# Patient Record
Sex: Male | Born: 1935 | Race: White | Hispanic: No | Marital: Married | Smoking: Former smoker
Health system: Southern US, Community
[De-identification: ages and names within clinical notes are randomized; demographics above are authoritative.]

## PROBLEM LIST (undated history)

## (undated) DIAGNOSIS — I2510 Atherosclerotic heart disease of native coronary artery without angina pectoris: Secondary | ICD-10-CM

## (undated) DIAGNOSIS — J449 Chronic obstructive pulmonary disease, unspecified: Secondary | ICD-10-CM

## (undated) DIAGNOSIS — I519 Heart disease, unspecified: Secondary | ICD-10-CM

## (undated) DIAGNOSIS — E119 Type 2 diabetes mellitus without complications: Secondary | ICD-10-CM

## (undated) DIAGNOSIS — C3490 Malignant neoplasm of unspecified part of unspecified bronchus or lung: Secondary | ICD-10-CM

## (undated) DIAGNOSIS — I10 Essential (primary) hypertension: Secondary | ICD-10-CM

## (undated) HISTORY — PX: EYE SURGERY: SHX253

## (undated) HISTORY — PX: CARDIAC SURGERY: SHX584

## (undated) HISTORY — PX: CORONARY ANGIOPLASTY WITH STENT PLACEMENT: SHX49

## (undated) HISTORY — PX: CHOLECYSTECTOMY: SHX55

---

## 1999-06-02 ENCOUNTER — Encounter: Payer: Self-pay | Admitting: *Deleted

## 1999-06-02 ENCOUNTER — Inpatient Hospital Stay (HOSPITAL_COMMUNITY): Admission: EM | Admit: 1999-06-02 | Discharge: 1999-06-03 | Payer: Self-pay | Admitting: *Deleted

## 1999-06-24 ENCOUNTER — Encounter: Payer: Self-pay | Admitting: Geriatric Medicine

## 1999-06-24 ENCOUNTER — Encounter: Admission: RE | Admit: 1999-06-24 | Discharge: 1999-06-24 | Payer: Self-pay | Admitting: Geriatric Medicine

## 1999-06-28 ENCOUNTER — Inpatient Hospital Stay (HOSPITAL_COMMUNITY): Admission: RE | Admit: 1999-06-28 | Discharge: 1999-07-01 | Payer: Self-pay | Admitting: *Deleted

## 1999-06-28 ENCOUNTER — Encounter (INDEPENDENT_AMBULATORY_CARE_PROVIDER_SITE_OTHER): Payer: Self-pay | Admitting: *Deleted

## 2003-02-09 ENCOUNTER — Encounter: Payer: Self-pay | Admitting: Emergency Medicine

## 2003-02-09 ENCOUNTER — Emergency Department (HOSPITAL_COMMUNITY): Admission: EM | Admit: 2003-02-09 | Discharge: 2003-02-09 | Payer: Self-pay | Admitting: Emergency Medicine

## 2003-02-13 ENCOUNTER — Ambulatory Visit (HOSPITAL_COMMUNITY): Admission: RE | Admit: 2003-02-13 | Discharge: 2003-02-13 | Payer: Self-pay | Admitting: Geriatric Medicine

## 2003-04-15 ENCOUNTER — Inpatient Hospital Stay (HOSPITAL_COMMUNITY): Admission: EM | Admit: 2003-04-15 | Discharge: 2003-04-27 | Payer: Self-pay | Admitting: Emergency Medicine

## 2003-04-15 ENCOUNTER — Encounter: Payer: Self-pay | Admitting: Interventional Cardiology

## 2003-04-16 ENCOUNTER — Encounter: Payer: Self-pay | Admitting: Interventional Cardiology

## 2003-04-17 ENCOUNTER — Encounter: Payer: Self-pay | Admitting: Thoracic Surgery (Cardiothoracic Vascular Surgery)

## 2003-04-21 ENCOUNTER — Encounter: Payer: Self-pay | Admitting: Thoracic Surgery (Cardiothoracic Vascular Surgery)

## 2003-04-22 ENCOUNTER — Encounter: Payer: Self-pay | Admitting: Thoracic Surgery (Cardiothoracic Vascular Surgery)

## 2003-04-23 ENCOUNTER — Encounter: Payer: Self-pay | Admitting: Thoracic Surgery (Cardiothoracic Vascular Surgery)

## 2003-06-01 ENCOUNTER — Encounter (HOSPITAL_COMMUNITY): Admission: RE | Admit: 2003-06-01 | Discharge: 2003-08-30 | Payer: Self-pay | Admitting: Interventional Cardiology

## 2003-06-27 ENCOUNTER — Inpatient Hospital Stay (HOSPITAL_COMMUNITY): Admission: EM | Admit: 2003-06-27 | Discharge: 2003-06-29 | Payer: Self-pay | Admitting: Emergency Medicine

## 2003-09-04 ENCOUNTER — Encounter: Admission: RE | Admit: 2003-09-04 | Discharge: 2003-09-04 | Payer: Self-pay | Admitting: Orthopedic Surgery

## 2009-12-02 ENCOUNTER — Encounter (HOSPITAL_COMMUNITY): Admission: RE | Admit: 2009-12-02 | Discharge: 2010-03-02 | Payer: Self-pay | Admitting: Internal Medicine

## 2010-03-08 ENCOUNTER — Encounter (HOSPITAL_COMMUNITY): Admission: RE | Admit: 2010-03-08 | Discharge: 2010-06-06 | Payer: Self-pay | Admitting: Internal Medicine

## 2010-06-14 ENCOUNTER — Encounter (HOSPITAL_COMMUNITY)
Admission: RE | Admit: 2010-06-14 | Discharge: 2010-09-12 | Payer: Self-pay | Source: Home / Self Care | Attending: Internal Medicine | Admitting: Internal Medicine

## 2010-09-15 ENCOUNTER — Encounter (HOSPITAL_COMMUNITY): Payer: Self-pay

## 2010-09-19 ENCOUNTER — Ambulatory Visit (HOSPITAL_COMMUNITY): Payer: Self-pay

## 2010-09-20 ENCOUNTER — Encounter (HOSPITAL_COMMUNITY): Payer: Self-pay

## 2010-09-21 ENCOUNTER — Ambulatory Visit (HOSPITAL_COMMUNITY): Payer: Self-pay

## 2010-09-22 ENCOUNTER — Encounter (HOSPITAL_COMMUNITY): Payer: Self-pay

## 2010-09-23 ENCOUNTER — Ambulatory Visit (HOSPITAL_COMMUNITY): Payer: Self-pay

## 2010-09-26 ENCOUNTER — Ambulatory Visit (HOSPITAL_COMMUNITY): Payer: Self-pay

## 2010-09-27 ENCOUNTER — Encounter (HOSPITAL_COMMUNITY): Payer: Self-pay

## 2010-09-28 ENCOUNTER — Ambulatory Visit (HOSPITAL_COMMUNITY): Payer: Self-pay

## 2010-09-29 ENCOUNTER — Encounter (HOSPITAL_COMMUNITY): Payer: Self-pay

## 2010-09-30 ENCOUNTER — Ambulatory Visit (HOSPITAL_COMMUNITY): Payer: Self-pay

## 2010-10-03 ENCOUNTER — Ambulatory Visit (HOSPITAL_COMMUNITY): Payer: Self-pay

## 2010-10-04 ENCOUNTER — Encounter (HOSPITAL_COMMUNITY): Payer: Self-pay

## 2010-10-05 ENCOUNTER — Ambulatory Visit (HOSPITAL_COMMUNITY): Payer: Self-pay

## 2010-10-06 ENCOUNTER — Encounter (HOSPITAL_COMMUNITY): Payer: Self-pay

## 2010-10-07 ENCOUNTER — Ambulatory Visit (HOSPITAL_COMMUNITY): Payer: Self-pay

## 2010-10-10 ENCOUNTER — Ambulatory Visit (HOSPITAL_COMMUNITY): Payer: Self-pay

## 2010-10-11 ENCOUNTER — Encounter (HOSPITAL_COMMUNITY): Payer: Self-pay

## 2010-10-12 ENCOUNTER — Ambulatory Visit (HOSPITAL_COMMUNITY): Payer: Self-pay

## 2010-10-13 ENCOUNTER — Encounter (HOSPITAL_COMMUNITY): Payer: Self-pay

## 2010-10-14 ENCOUNTER — Ambulatory Visit (HOSPITAL_COMMUNITY): Payer: Self-pay

## 2010-10-17 ENCOUNTER — Ambulatory Visit (HOSPITAL_COMMUNITY): Payer: Self-pay

## 2010-10-18 ENCOUNTER — Encounter (HOSPITAL_COMMUNITY): Payer: Self-pay | Attending: Internal Medicine

## 2010-10-18 DIAGNOSIS — J4489 Other specified chronic obstructive pulmonary disease: Secondary | ICD-10-CM | POA: Insufficient documentation

## 2010-10-18 DIAGNOSIS — J449 Chronic obstructive pulmonary disease, unspecified: Secondary | ICD-10-CM | POA: Insufficient documentation

## 2010-10-18 DIAGNOSIS — Z85118 Personal history of other malignant neoplasm of bronchus and lung: Secondary | ICD-10-CM | POA: Insufficient documentation

## 2010-10-18 DIAGNOSIS — Z5189 Encounter for other specified aftercare: Secondary | ICD-10-CM | POA: Insufficient documentation

## 2010-10-18 DIAGNOSIS — E119 Type 2 diabetes mellitus without complications: Secondary | ICD-10-CM | POA: Insufficient documentation

## 2010-10-18 DIAGNOSIS — I10 Essential (primary) hypertension: Secondary | ICD-10-CM | POA: Insufficient documentation

## 2010-10-18 DIAGNOSIS — Z951 Presence of aortocoronary bypass graft: Secondary | ICD-10-CM | POA: Insufficient documentation

## 2010-10-19 ENCOUNTER — Ambulatory Visit (HOSPITAL_COMMUNITY): Payer: Self-pay

## 2010-10-20 ENCOUNTER — Encounter (HOSPITAL_COMMUNITY): Payer: Self-pay

## 2010-10-21 ENCOUNTER — Ambulatory Visit (HOSPITAL_COMMUNITY): Payer: Self-pay

## 2010-10-24 ENCOUNTER — Ambulatory Visit (HOSPITAL_COMMUNITY): Payer: Self-pay

## 2010-10-25 ENCOUNTER — Encounter (HOSPITAL_COMMUNITY): Payer: Self-pay

## 2010-10-26 ENCOUNTER — Ambulatory Visit (HOSPITAL_COMMUNITY): Payer: Self-pay

## 2010-10-27 ENCOUNTER — Encounter (HOSPITAL_COMMUNITY): Payer: Self-pay

## 2010-10-28 ENCOUNTER — Ambulatory Visit (HOSPITAL_COMMUNITY): Payer: Self-pay

## 2010-10-30 LAB — GLUCOSE, CAPILLARY: Glucose-Capillary: 131 mg/dL — ABNORMAL HIGH (ref 70–99)

## 2010-10-31 ENCOUNTER — Ambulatory Visit (HOSPITAL_COMMUNITY): Payer: Self-pay

## 2010-10-31 LAB — GLUCOSE, CAPILLARY
Glucose-Capillary: 122 mg/dL — ABNORMAL HIGH (ref 70–99)
Glucose-Capillary: 134 mg/dL — ABNORMAL HIGH (ref 70–99)
Glucose-Capillary: 161 mg/dL — ABNORMAL HIGH (ref 70–99)

## 2010-11-01 ENCOUNTER — Encounter (HOSPITAL_COMMUNITY): Payer: Self-pay

## 2010-11-01 LAB — GLUCOSE, CAPILLARY
Glucose-Capillary: 122 mg/dL — ABNORMAL HIGH (ref 70–99)
Glucose-Capillary: 92 mg/dL (ref 70–99)
Glucose-Capillary: 97 mg/dL (ref 70–99)

## 2010-11-02 ENCOUNTER — Ambulatory Visit (HOSPITAL_COMMUNITY): Payer: Self-pay

## 2010-11-03 ENCOUNTER — Encounter (HOSPITAL_COMMUNITY): Payer: Self-pay

## 2010-11-04 ENCOUNTER — Ambulatory Visit (HOSPITAL_COMMUNITY): Payer: Self-pay

## 2010-11-07 ENCOUNTER — Ambulatory Visit (HOSPITAL_COMMUNITY): Payer: Self-pay

## 2010-11-08 ENCOUNTER — Encounter (HOSPITAL_COMMUNITY): Payer: Self-pay

## 2010-11-09 ENCOUNTER — Ambulatory Visit (HOSPITAL_COMMUNITY): Payer: Self-pay

## 2010-11-10 ENCOUNTER — Encounter (HOSPITAL_COMMUNITY): Payer: Self-pay

## 2010-11-11 ENCOUNTER — Ambulatory Visit (HOSPITAL_COMMUNITY): Payer: Self-pay

## 2010-11-14 ENCOUNTER — Ambulatory Visit (HOSPITAL_COMMUNITY): Payer: Self-pay

## 2010-11-15 ENCOUNTER — Encounter (HOSPITAL_COMMUNITY): Payer: Self-pay | Attending: Internal Medicine

## 2010-11-15 DIAGNOSIS — J449 Chronic obstructive pulmonary disease, unspecified: Secondary | ICD-10-CM | POA: Insufficient documentation

## 2010-11-15 DIAGNOSIS — I10 Essential (primary) hypertension: Secondary | ICD-10-CM | POA: Insufficient documentation

## 2010-11-15 DIAGNOSIS — Z951 Presence of aortocoronary bypass graft: Secondary | ICD-10-CM | POA: Insufficient documentation

## 2010-11-15 DIAGNOSIS — Z5189 Encounter for other specified aftercare: Secondary | ICD-10-CM | POA: Insufficient documentation

## 2010-11-15 DIAGNOSIS — J4489 Other specified chronic obstructive pulmonary disease: Secondary | ICD-10-CM | POA: Insufficient documentation

## 2010-11-15 DIAGNOSIS — E119 Type 2 diabetes mellitus without complications: Secondary | ICD-10-CM | POA: Insufficient documentation

## 2010-11-15 DIAGNOSIS — Z85118 Personal history of other malignant neoplasm of bronchus and lung: Secondary | ICD-10-CM | POA: Insufficient documentation

## 2010-11-16 ENCOUNTER — Ambulatory Visit (HOSPITAL_COMMUNITY): Payer: Self-pay

## 2010-11-17 ENCOUNTER — Encounter (HOSPITAL_COMMUNITY): Payer: Self-pay

## 2010-11-18 ENCOUNTER — Ambulatory Visit (HOSPITAL_COMMUNITY): Payer: Self-pay

## 2010-11-21 ENCOUNTER — Ambulatory Visit (HOSPITAL_COMMUNITY): Payer: Self-pay

## 2010-11-22 ENCOUNTER — Encounter (HOSPITAL_COMMUNITY): Payer: Self-pay

## 2010-11-23 ENCOUNTER — Ambulatory Visit (HOSPITAL_COMMUNITY): Payer: Self-pay

## 2010-11-24 ENCOUNTER — Encounter (HOSPITAL_COMMUNITY): Payer: Self-pay

## 2010-11-25 ENCOUNTER — Ambulatory Visit (HOSPITAL_COMMUNITY): Payer: Self-pay

## 2010-11-28 ENCOUNTER — Ambulatory Visit (HOSPITAL_COMMUNITY): Payer: Self-pay

## 2010-11-29 ENCOUNTER — Encounter (HOSPITAL_COMMUNITY): Payer: Self-pay

## 2010-11-30 ENCOUNTER — Ambulatory Visit (HOSPITAL_COMMUNITY): Payer: Self-pay

## 2010-12-01 ENCOUNTER — Encounter (HOSPITAL_COMMUNITY): Payer: Self-pay

## 2010-12-02 ENCOUNTER — Ambulatory Visit (HOSPITAL_COMMUNITY): Payer: Self-pay

## 2010-12-05 ENCOUNTER — Ambulatory Visit (HOSPITAL_COMMUNITY): Payer: Self-pay

## 2010-12-06 ENCOUNTER — Encounter (HOSPITAL_COMMUNITY): Payer: Self-pay

## 2010-12-07 ENCOUNTER — Ambulatory Visit (HOSPITAL_COMMUNITY): Payer: Self-pay

## 2010-12-08 ENCOUNTER — Encounter (HOSPITAL_COMMUNITY): Payer: Self-pay

## 2010-12-09 ENCOUNTER — Ambulatory Visit (HOSPITAL_COMMUNITY): Payer: Self-pay

## 2010-12-12 ENCOUNTER — Ambulatory Visit (HOSPITAL_COMMUNITY): Payer: Self-pay

## 2010-12-13 ENCOUNTER — Encounter (HOSPITAL_COMMUNITY): Payer: Self-pay | Attending: Internal Medicine

## 2010-12-13 DIAGNOSIS — I10 Essential (primary) hypertension: Secondary | ICD-10-CM | POA: Insufficient documentation

## 2010-12-13 DIAGNOSIS — E119 Type 2 diabetes mellitus without complications: Secondary | ICD-10-CM | POA: Insufficient documentation

## 2010-12-13 DIAGNOSIS — J449 Chronic obstructive pulmonary disease, unspecified: Secondary | ICD-10-CM | POA: Insufficient documentation

## 2010-12-13 DIAGNOSIS — Z85118 Personal history of other malignant neoplasm of bronchus and lung: Secondary | ICD-10-CM | POA: Insufficient documentation

## 2010-12-13 DIAGNOSIS — Z951 Presence of aortocoronary bypass graft: Secondary | ICD-10-CM | POA: Insufficient documentation

## 2010-12-13 DIAGNOSIS — J4489 Other specified chronic obstructive pulmonary disease: Secondary | ICD-10-CM | POA: Insufficient documentation

## 2010-12-13 DIAGNOSIS — Z5189 Encounter for other specified aftercare: Secondary | ICD-10-CM | POA: Insufficient documentation

## 2010-12-14 ENCOUNTER — Ambulatory Visit (HOSPITAL_COMMUNITY): Payer: Self-pay

## 2010-12-15 ENCOUNTER — Encounter (HOSPITAL_COMMUNITY): Payer: Self-pay

## 2010-12-16 ENCOUNTER — Ambulatory Visit (HOSPITAL_COMMUNITY): Payer: Self-pay

## 2010-12-19 ENCOUNTER — Ambulatory Visit (HOSPITAL_COMMUNITY): Payer: Self-pay

## 2010-12-20 ENCOUNTER — Encounter (HOSPITAL_COMMUNITY): Payer: Self-pay

## 2010-12-21 ENCOUNTER — Ambulatory Visit (HOSPITAL_COMMUNITY): Payer: Self-pay

## 2010-12-22 ENCOUNTER — Encounter (HOSPITAL_COMMUNITY): Payer: Self-pay

## 2010-12-23 ENCOUNTER — Ambulatory Visit (HOSPITAL_COMMUNITY): Payer: Self-pay

## 2010-12-26 ENCOUNTER — Ambulatory Visit (HOSPITAL_COMMUNITY): Payer: Self-pay

## 2010-12-27 ENCOUNTER — Encounter (HOSPITAL_COMMUNITY): Payer: Self-pay

## 2010-12-28 ENCOUNTER — Ambulatory Visit (HOSPITAL_COMMUNITY): Payer: Self-pay

## 2010-12-29 ENCOUNTER — Encounter (HOSPITAL_COMMUNITY): Payer: Self-pay

## 2010-12-30 ENCOUNTER — Ambulatory Visit (HOSPITAL_COMMUNITY): Payer: Self-pay

## 2010-12-30 NOTE — H&P (Signed)
NAME:  Darrell Livingston, Darrell Livingston                      ACCOUNT NO.:  [id number]   MEDICAL RECORD NO.:  [id number]                   PATIENT TYPE:  INP   LOCATION:  2008                                 FACILITY:  [facility]   PHYSICIAN:  Cassell Clement, M.D.              DATE OF BIRTH:  [birth year]   DATE OF ADMISSION:  06/26/2003  DATE OF DISCHARGE:                                HISTORY & PHYSICAL   HISTORY:  This is an 77 year old gentleman admitted with increasing dyspnea  and probable early congestive heart failure.  The patient has known coronary  artery disease and 8 weeks ago underwent coronary artery bypass graft  surgery by Dr Cornelius Moras.  He has been doing well postoperatively and has been in  the cardiac rehab program for 2 weeks. He was doing well until several days  ago when he noticed increasing cough and dyspnea.  He has not had any sputum  production and no definite fever or chills.  He went to the [facility] last evening and was then sent to [facility] Emergency Room for  further evaluation.  Beta naturetic peptide in the emergency room was  elevated at 232 and CK/MB was slightly elevated at 6.4 although troponin I  was normal at less than 0.01 and the patient was admitted.   FAMILY HISTORY:  Mother died of heart failure at age 40.  Father died of  heart problems at 34.   SOCIAL HISTORY:  Reveals that he is married.  His wife is 46 and is in fair  health having had colectomy for colon cancer earlier this year.  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  The patient is a former smoker, having  quit 9 years ago.  He drinks moderate alcohol.  He is married and has 3  children living and well.   PAST MEDICAL HISTORY:  Includes cholecystectomy 4 years ago by Dr. Samuella Cota.   REVIEW OF SYSTEMS:  CARDIOVASCULAR:  No history of  postop angina.  RESPIRATORY:  Reveals that he has been taking some over-the-counter  preparations for a chest cold recently, such as Robitussin without much  improvement.  GENITOURINARY:  Negative.  ENDOCRINE:  Reveals a history of  borderline diabetes on Actos.   ALLERGIES:  No known drug allergies.   REVIEW OF SYSTEMS:  The remainder of the review of systems is negative in  detail.   PHYSICAL EXAMINATION:  VITAL SIGNS:  On physical exam the temperature is 99,  blood pressure 100/70, pulse is 100 and normal sinus rhythm.  HEENT AND NECK:  Jugular venous pressure normal.  Carotids normal.  Thyroid  normal. Pupils equal and reactive.  CHEST:  The chest reveals bibasilar rales and a few rales. There is  no  pleural friction rub.  Incisions appear to be healing well.  HEART:  The heart reveals no murmur, gallop, rub, or click.  ABDOMEN:  The abdomen is soft and nontender.  EXTREMITIES:  The extremities show good pulses, no phlebitis and no edema.   His electrocardiogram shows normal sinus rhythm and Q waves in II, III, and  AVF consistent with old inferior wall MI.  Laboratory Studies:  White count  is elevated on admission at 13,600 with 86% segs.  His electrolytes:  Sodium  139, potassium 3.6, BUN 16, creatinine 1.1.  Beta naturetic peptide 232.   IMPRESSION:  1. Congestive heart failure.  2. Probable superimposed bronchitis.  3. Recent coronary artery bypass graft surgery.  4. Diabetes mellitus.  5. Status post cholecystectomy.   DISPOSITION:  Admit to telemetry, IV heparin.  Will treat congestive heart  failure with the addition of Lasix and we will continue his ACE inhibitor.  Will treat his bronchitis with Zithromax and Humibid.  We will plan to get a  repeat chest x-ray in a.m.  We will pre-emptively replete his potassium with  K-Dur.  We will monitor his blood sugars while in the hospital.                                                Cassell Clement, M.D.     TB/MEDQ  D:  06/27/2003  T:  06/27/2003  Job:  [id number]   cc:   Lesleigh Noe, M.D.  [address]  [address]  [place]  [place] [address]  Fax: [phone]   Hal T. Stoneking, M.D.  [address]  [place], [place] [address]  Fax: [phone]   Salvatore Decent. Cornelius Moras, M.D.  [address]  [place]  [place] [address]

## 2010-12-30 NOTE — Op Note (Signed)
NAME:  Darrell Livingston, Darrell Livingston NO.:  [id number]   MEDICAL RECORD NO.:  [id number]                   PATIENT TYPE:  INP   LOCATION:                                       FACILITY:  [facility]   PHYSICIAN:  Salvatore Decent. Cornelius Moras, M.D.              DATE OF BIRTH:  1921-05-28   DATE OF PROCEDURE:  04/21/2003  DATE OF DISCHARGE:                                 OPERATIVE REPORT   PREOPERATIVE DIAGNOSIS:  Severe three vessel coronary artery disease, left  main disease, status post acute non-Q wave myocardial infarction.   POSTOPERATIVE DIAGNOSIS:  Severe three vessel coronary artery disease, left  main disease, status post acute non-Q wave myocardial infarction.   PROCEDURE:  Median sternotomy for coronary artery bypass grafting times four  with endoscopic vein harvest right thigh (left internal mammary artery to  distal left anterior descending coronary artery, saphenous vein graft to  first circumflex marginal branch and sequential saphenous vein graft to  second circumflex marginal branch, saphenous vein graft to posterior  descending coronary artery).   SURGEON:  Salvatore Decent. Cornelius Moras, M.D.   ASSISTANT:  Kerin Perna, M.D.   SECOND ASSISTANT:  Coral Ceo, P.A.   ANESTHESIA:  General.   BRIEF CLINICAL NOTE:  The patient is an 77 year old gentleman from  [place] followed by Dr. Merlene Laughter and referred by Dr. Verdis Prime for  management of coronary artery disease.  The patient presents with recent  acute myocardial infarction documented by abnormal EKG with abnormal serum  cardiac enzymes.  He underwent cardiac catheterization by Dr. Katrinka Blazing  demonstrating 60% stenosis of the left main coronary artery with severe  three vessel coronary artery disease.  A full consultation note has been  dictated previously.  The patient and his family have been counseled at  length regarding the indications, risks and potential benefits of coronary  artery bypass grafting.   Alternative treatment strategies have been  discussed.  They understand and accept all associated risks of surgery  including but not limited to the risks of death, stroke, myocardial  infarction, respiratory failure, pneumonia, bleeding requiring blood  transfusion, arrhythmia, infection and recurrent coronary artery disease.  All of their questions have been addressed.   DESCRIPTION OF PROCEDURE:  The patient is brought to the operating room on  the above mentioned date and central monitoring is established by the  anesthesia service under the care and direction of Dr. Arta Bruce.  Specifically, a Swan-Ganz catheter was placed through the right internal  jugular approach. A radial arterial line is placed.  Intravenous antibiotics  are administered.  Following induction with general endotracheal anesthesia,  a Foley catheter was placed.   Baseline transesophageal echocardiogram is performed by Dr. Michelle Piper.  This  demonstrates the presence of fairly normal left ventricular function with  mild left ventricular dysfunction consisting of inferior wall hypokinesis.  The overall ejection fraction is estimated at greater than 50%.  There is  trace mitral regurgitation.  There are no other significant valve  abnormalities appreciated.   The patient's chest, abdomen, both groins and both lower extremities are  prepared and draped in sterile manner.  A median sternotomy incision is  performed and the left internal mammary artery is dissected from the chest  wall and prepared for bypass grafting.  The left internal mammary artery is  good quality conduit.  Simultaneously, saphenous vein is obtained from the  patient's right thigh using endoscopic vein harvest technique.  The  saphenous vein is good quality conduit.  The patient is heparinized  systemically.   The pericardium is opened.  The ascending aorta is normal in appearance.  The ascending aorta and the right atrium are cannulated for  cardiopulmonary  bypass.  Adequate heparinization is verified.  Cardiopulmonary bypass is  begun and the surface of the heart is inspected.  Distal sites are selected  for coronary artery bypass grafting.  Portions of saphenous vein and the  left internal mammary artery are trimmed to appropriate length.  A  temperature probe is placed in the left ventricular septum.  A cardioplegia  catheter is placed in the ascending aorta.   The patient is cooled to 32 degrees systemic temperature.  The aortic cross  clamp is applied and cardioplegia is delivered in an antegrade fashion  through the aortic root. Ice saline slush is applied for topical  hypothermia.  The initial cardioplegic arrest and myocardial cooling are  felt to be excellent.  Repeat doses of cardioplegia are administered  intermittently throughout the cross clamp portion of the operation both  through the aortic root and down the subsequently placed vein graft to  maintain septal temperature below 15 degrees Centigrade.  The following  distal coronary anastomoses are performed:  1. The posterior descending artery was grafted with a saphenous vein graft     in an end to side fashion.  This coronary measured 1.4 mm in diameter and     is of fair quality.  2. The first circumflex marginal branch is grafted with a saphenous vein     graft in a side to side fashion. This coronary measured 1.5 mm in     diameter and is of good quality.  3. The second circumflex marginal branch is grafted with a sequential     saphenous vein graft using the vein placed at the first circumflex     marginal branch. This coronary measures 1.5 mm in diameter and is of good     quality.  4. The distal left anterior descending coronary artery is grafted with the     left internal mammary artery in an end to side fashion. This coronary    measures 1.4 mm in diameter and is of good quality at the site of distal     bypass.  Both proximal saphenous vein  anastomoses are performed directly     to the ascending aorta prior to removal of the aortic cross clamp.  The     septal temperature was noted to rise rapidly and dramatically upon     reperfusion of the left internal mammary artery.  The aortic cross clamp     is removed after a total cross clamp time of 55 minutes.   The heart begins to beat spontaneously without need for cardioversion.  All  proximal and distal anastomoses are inspected for hemostasis and appropriate  graft orientation.  Epicardial pacing wires are fixed to the right  ventricular outflow tract and to the right atrial appendage.  The patient is  re-warmed to 37 degrees Centigrade temperature.  The patient is weaned from  cardiopulmonary bypass without difficulty.  The patient's rhythm at  separation from bypass is sinus bradycardia.  Atrial pacing is employed to  increase the heart rate.  No inotropic support is required.  Total  cardiopulmonary bypass time for the operation is 82 minutes.   The venous and arterial cannulae are removed uneventfully. Protamine is  administered to reverse the anticoagulation.  The mediastinum and the left  chest are irrigated with saline solution containing vancomycin.  Meticulous  surgical hemostasis is ascertained.  The mediastinum and the left chest are  drained with three chest tubes placed through separate stab incisions  inferiorly.  The median sternotomy is closed in routine fashion.  The right  lower extremity incision is closed in multiple layers in routine fashion.  All skin incisions are closed with subcuticular skin closures.   The patient tolerated the procedure well and is transported to the surgical  intensive care unit in stable condition.  There were no intraoperative  complications.  All sponge, instrument and needle counts are verified  correct at the completion of the operation.  No blood products were  administered.                                                Salvatore Decent. Cornelius Moras, M.D.    CHO/MEDQ  D:  04/21/2003  T:  04/21/2003  Job:  [id number]   cc:   Lesleigh Noe, M.D.  [address]  [place]  [place] [address]  Fax: [phone]   Hal T. Stoneking, M.D.  [address] [place], [place] [address]  Fax: [phone]

## 2010-12-30 NOTE — Discharge Summary (Signed)
NAME:  Darrell Livingston, Darrell Livingston                      ACCOUNT NO.:  [id number]   MEDICAL RECORD NO.:  [id number]                   PATIENT TYPE:  INP   LOCATION:  2036                                 FACILITY:  [facility]   PHYSICIAN:  Salvatore Decent. Cornelius Moras, M.D.              DATE OF BIRTH:  05/07/1921   DATE OF ADMISSION:  04/15/2003  DATE OF DISCHARGE:  04/27/2003                                 DISCHARGE SUMMARY   HISTORY OF PRESENT ILLNESS:  This is an 77 year old male who presented to  the emergency room from Hal T. Stoneking, M.D. office with a two day history  of indigestion as well as left arm discomfort that he described as a  numbness.  The symptoms began on April 11, 2003 in the evening after he had  gone to bed and was also associated with a mid sternal burning sensation as  well as frequent belching associated with the left arm discomfort.  There  was no shortness of breath, no orthopnea, no diaphoresis or nausea.  The  symptoms were intermittent.  The patient phoned his physician and was told  to take Pepcid which he did x2 and got relief of the symptoms.  The patient  had his right eye cataract removed on the Monday prior to admission and had  no recurrent symptoms.  He had been noting some increasing dyspnea on  exertion over the last year.  He was put on an Advair inhaler approximately  three weeks prior to admission.  He went for a check-up at Hal T. Stoneking,  M.D. office on the date of admission and reporting the symptoms an EKG was  done which showed an inferior myocardial infarction of uncertain age and he  was sent to the hospital for admission and further evaluation and treatment.   PAST MEDICAL HISTORY:  1. Hypertension.  2. Hyperlipidemia.  3. Non-insulin-dependent diabetes.  4. Mitral regurgitation by 2-D echocardiogram in October of 1994.  5. Obesity.  6. Glaucoma.  7. Moderate COPD.  8. Cataract removal April 13, 2003 right eye.  9. Status post cholecystectomy in  2000.  10.      Status post appendectomy age 37.   OUTPATIENT MEDICATIONS:  1. Hydrochlorothiazide 12.5 mg daily.  2. Actos question dose daily.  3. Xalatan drops one drop OU daily.  4. Advair 100/50 b.i.d.  5. Spiriva nebulizer daily.  6. Univasc 7.5 mg one-half tablet daily.  7. Pravachol 20 mg q.h.s.  8. Aspirin 325 mg daily.  9. Albuterol two puffs t.i.d.   ALLERGIES:  No known drug allergies.   FAMILY HISTORY:  Please see the history and physical done at the time of  admission.   SOCIAL HISTORY:  Please see the history and physical done at the time of  admission.   REVIEW OF SYSTEMS:  Please see the history and physical done at the time of  admission.   PHYSICAL EXAMINATION:  Please see  the history and physical done at the time  of admission.   HOSPITAL COURSE:  The patient was admitted with inferior myocardial  infarction.  Serial EKG and enzymes were obtained.  He was started on IV  heparin as well as Integrilin and scheduled for cardiac catheterization.  Consultation with Lyn Records, M.D.  On April 16, 2003 the patient was  taken to the catheterization laboratory where he was found to have severe  coronary artery disease with significant 60% left main, 60-70% LAD, 99%  circumflex, and 100% right coronary artery lesions.  He was also noted to  have mild left ventricular dysfunction and a renal artery stenosis on the  right side.  Due to these findings CVTS consultation was obtained with  Salvatore Decent. Cornelius Moras, M.D. who evaluated the patient and studies and recommended  further pulmonary evaluation including repeat pulmonary function tests where  he was found to have severe obstructive disease.  The patient was felt to  best be served by a pulmonary consultation prior to decision for proceeding  for surgical revascularization.  This was obtained with Danice Goltz, M.D.  [facility] who made recommendations, but felt that the patient was an adequate  candidate for  proceeding with surgery.  Procedure:  On April 21, 2003 the  patient underwent the following procedure:  Coronary artery bypass grafting  x4.  The following grafts were placed:  Left internal mammary artery to the  distal left anterior descending coronary artery, saphenous vein graft to the  first circumflex marginal branch and sequential saphenous vein graft to the  second circumflex marginal branch, and finally, a saphenous vein graft to  the posterior descending coronary artery.  Procedure was performed by  Salvatore Decent. Cornelius Moras, M.D.  The patient tolerated the procedure well.  Was taken  to the surgical intensive care unit in stable condition.  Postoperative  hospital course patient has done well.  He was neurologically intact and  able to be weaned from the ventilator without significant difficulties.  He  did require aggressive pulmonary management including nebulizer medications.  He did, however, do quite well and was able to be weaned from oxygen.  In  regards to other parameters of recovery the patient has done well.  He has  maintained a normal sinus rhythm.  Incisions are showing good signs of  healing without signs of infection.  The patient remained afebrile and  diabetes was under adequate control.  The patient did have some minor  discharge from his sternal wound but this was monitored and showed no  evidence of cellulitis and on April 27, 2003 the patient was felt to be  quite stable for discharge at that time.   DISCHARGE MEDICATIONS:  1. Hydrochlorothiazide 12.5 mg daily.  2. Univasc 3.75 mg daily.  3. Eye drops as preoperatively.  4. He is also to resume his preoperative Actos.  5. Resume his pulmonary medications including Spiriva, Advair, and     albuterol.  6. Also resume Pravachol as preoperative dose.  7. Aspirin 325 mg daily.  8. Toprol XL 25 mg daily.  9. Lasix 40 mg daily.  10.      K-Dur 20 mEq daily for two weeks.  11.      For pain Ultram 50 mg one  q.6h. as needed.   DISCHARGE INSTRUCTIONS:  The patient received written instructions in regard  to medications, activity, diet, wound care, and follow-up.  Follow-up will  include Lyn Records, M.D. in two weeks, Marilu Favre  Zadie Cleverly, M.D. on October  4 at 11:00.   CONDITION ON DISCHARGE:  Stable and improved.   FINAL DIAGNOSES:  1. Recent inferior myocardial infarction, multiple vessel coronary artery     disease by cardiac catheterization as described above.  2. Hypertension.  3. Hyperlipidemia.  4. Non-insulin-dependent diabetes.  5. History of vascular bruits.  6. Right renal artery stenosis by cardiac catheterization.      Rowe Clack, P.A.-C.                    Salvatore Decent. Cornelius Moras, M.D.    Sherryll Burger  D:  06/10/2003  T:  06/10/2003  Job:  [id number]   cc:   Lyn Records III, M.D.  [address]  [place]  [place] [address]  Fax: [phone]   Hal T. Stoneking, M.D.  [address] [place], [place] [address]  Fax: [phone]

## 2010-12-30 NOTE — Consult Note (Signed)
NAME:  Darrell Livingston, Darrell Livingston                      ACCOUNT NO.:  [id number]   MEDICAL RECORD NO.:  [id number]                   PATIENT TYPE:  INP   LOCATION:  2907                                 FACILITY:  [facility]   PHYSICIAN:  Darrell Livingston, M.D.              DATE OF BIRTH:  1921-06-19   DATE OF CONSULTATION:  04/16/2003  DATE OF DISCHARGE:                                   CONSULTATION   REQUESTING PHYSICIAN:  Darrell Livingston, M.D.   Darrell CARE PHYSICIAN:  Darrell Livingston, M.D.   REASON FOR CONSULTATION:  Severe three-vessel coronary artery disease,  status post acute non-Q-wave myocardial infarction.   HISTORY OF PRESENT ILLNESS:  Darrell Livingston is an 77 year old, morbidly  obese, white male with no previous cardiac history, but risk factors  including history of hypertension, hyperlipidemia, type 2 diabetes mellitus,  and a remote history of tobacco use.  The patient has been followed closely  by Darrell Livingston, M.D., for several years.  He also is known to have a  history of chronic obstructive pulmonary disease.  He describes a long  history of progressive symptoms of exertional shortness of breath.  Approximately 10 days ago, he was scheduled for elective cataract extraction  from his right eye.  That morning, he took an extra dose of his ACE  inhibitor on an empty stomach and when he presented for his elective surgery  he got severe diaphoretic, confused, and dizzy and was noted to be  hypotensive.  He was sent to the emergency room where he was evaluated and  apparently ruled out for possible stroke or myocardial infarction.  His  blood pressure recovered and all of his symptoms abated.  In the early  morning of April 12, 2003, he developed sudden onset of severe burning pain  in his epigastric region which he attributed to acid indigestion.  This was  associated with severe numbness involving his entire left arm.  The pain  persisted throughout that entire morning  and he felt poorly with the pain  ultimately subsiding after several hours.  It was associated with some  shortness of breath and diaphoresis.  He did not feel well all that day, but  his symptoms resolved.  The following day, he underwent elective cataract  extraction by Darrell Livingston apparently without complication.  Yesterday the  patient presented to see Darrell Livingston for routine followup appointment.  He  discussed his recent symptomatology with Darrell Livingston and a 12-lead  electrocardiogram was performed, demonstrating EKG changes consistent with  inferior myocardial infarction.  He was sent directly to the hospital where  he was evaluated by Darrell Livingston.  Cardiac enzymes were positive for recent  myocardial infarction with initial total CK of 485 with CK-MB fraction 16.6  and troponin I 3.08.  The patient was admitted with presumed recent acute  inferior myocardial infarction.  He was taken for cardiac catheterization  earlier  today by Darrell Livingston where the findings at the time of catheterization  were notable for severe three-vessel coronary artery disease, as well as 60%  stenosis of the distal left main coronary artery.  Additional findings were  notable for 80% stenosis of the right renal artery.  Left ventricular  function was mildly reduced with ejection fraction estimated at 50% with  inferior wall hypokinesis.  A cardiac surgical consultation has been  requested to consider surgical revascularization.   REVIEW OF SYSTEMS:  GENERAL:  The patient reports overall feeling well with  the exception of progressive exertional shortness of breath and fatigue.  He  had excellent appetite.  He has had longstanding problems with keeping his  weight down, although he states that over the last two years his weight has  been fairly stable.  At the time of his admission to the hospital, he  weighed 227.5 pounds and he is 5 feet 10 inches tall.  CARDIAC:  The patient  denies any episodes of burning  epigastric chest pain prior to that, which  occurred within the last few days.  He has had additional mild similar pain  over the ensuing 48 hours, but none since hospital admission.  He denies any  other prolonged episodes of severe burning epigastric pain or numbness in  his left arm.  He has severe exertional shortness of breath and has to stop  to rest due to shortness of breath when walking more than 30-50 feet.  He  can go up and down flights of stairs very slow, but he tires easily.  He  denies any episodes of resting shortness of breath or PND.  He sleeps on two  pillows at night.  He reports no history of lower extremity edema,  palpitations, or syncope.  He did get diaphoretic with near syncopal episode  last week after he had taken too much of his blood pressure medicine at the  time he presented for his possible cataract extraction.  RESPIRATORY:  The  patient reports only occasional intermittent cough productive of thin  whitish sputum.  He otherwise denies any productive cough, hemoptysis, or  wheezing.  He has chronic severe exertional shortness of breath which at  times seems to be improved slightly by use of bronchodilator therapy.  GASTROINTESTINAL:  Negative.  The patient reports no difficulty swallowing.  He uses Metamucil to keep his bowels regular and denies any problems with  severe constipation, diarrhea, hematemesis, hematochezia, and melena.  MUSCULOSKELETAL:  Essentially negative, although the patient does have  problems with his right knee.  His ambulation is limited primarily by  shortness of breath.  NEUROLOGIC:  Negative.  The patient denies symptoms  suggestive of recent stroke or TIA.  He denies history of seizures.  HEENT:  Notable for recent cataract extraction in the right eye and previous  cataract extraction in the left eye last night.  He has full set upper and lower dentures.  INFECTIOUS:  Negative.  The patient denies recent fevers or  chills.   HEMATOLOGIC:  Negative.  The patient denies bleeding diaphysis.  ENDOCRINE:  Notable for type 2 diabetes mellitus.  The patient does not  check his blood sugars at home.  He does not know his most recent hemoglobin  A1C value.  He does not know his most recent cholesterol value.   PAST MEDICAL HISTORY:  Notable for:  1. History of obesity.  2. Hypertension.  3. Hyperlipidemia.  4. Type 2 diabetes mellitus.  5. Glaucoma.  6. Cataracts.  7. The patient allegedly has a history of mitral regurgitation on previous     echocardiogram, although the details of this are not currently available.   PAST SURGICAL HISTORY:  The patient underwent cholecystectomy a few years  ago and appendectomy in the remote past.   FAMILY HISTORY:  Notable in that his father died of a myocardial infarction  at age 42.   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  He lives with his wife and has a very supportive family.  He has a  longstanding history of heavy tobacco abuse, although he quit smoking more  than five years ago.  He reports moderate alcohol consumption.   MEDICATIONS:  Medications prior to admission include hydrochlorothiazide,  Actos, Univasc, Pravachol, aspirin, Xalatan eyedrops, Advair Diskus,  albuterol inhaler, and Spiriva inhaler.   ALLERGIES:  The patient denies any known drug allergies or sensitivities.   PHYSICAL EXAMINATION:  GENERAL APPEARANCE:  Notable for a well-appearing,  obese, white male who appears his stated age and in no acute distress.  VITAL SIGNS:  He is currently in normal sinus rhythm and afebrile.  HEENT:  Exam is essentially unrevealing.  He is edentulous and has a full  set of dentures.  NECK:  Supple.  No carotid bruits are appreciated.  There is no jugular  venous distention.  LYMPHATICS:  There is no palpable cervical nor supraclavicular  lymphadenopathy.  CHEST:  Auscultation of the chest demonstrates hollow breath sounds with  a  few inspiratory crackles.  No wheezes or rhonchi are noted.  CARDIOVASCULAR:  Regular rate and rhythm, although heart sounds are quite  distant.  No murmurs or rubs are noted.  ABDOMEN:  Very obese.  It is soft and nontender.  There are no palpable  masses.  The liver edge is not palpable.  Bowel sounds are present.  EXTREMITIES:  Warm and well perfused.  There is no lower extremity edema.  Distal pulses are not palpable in either lower leg at the ankle.  There is  no sign of venous insufficiency.  RECTAL:  Deferred.  GENITOURINARY:  Deferred.  NEUROLOGIC:  Grossly nonfocal.   LABORATORY DATA:  Baseline laboratory data from April 15, 2003, include  hemoglobin 14.7 with hematocrit 43.9%, white blood count 7700, and platelet  count 182,000.  Comprehensive metabolic panel includes sodium 137, potassium  3.6, chloride 101, bicarbonate 28, BUN 11, creatinine 0.8, and glucose 109. The liver function profile was within normal limits with the exception of  SGOT that was barely elevated at 47.  Baseline serum albumin was 3.7.  The  12-lead electrocardiogram demonstrates normal sinus rhythm with Q waves in  leads 2, 3, and aVF consistent with previous inferior myocardial infarction,  as well as nonspecific ST-T wave changes consistent with possible lateral  wall ischemia.   DIAGNOSTIC TESTS:  Cardiac catheterization performed today by Darrell Livingston has  been reviewed and notable for findings as discussed previously.  Specifically, there is 50-60% stenosis of the distal left main coronary  artery.  There is 70% proximal stenosis of the left circumflex coronary  artery.  There is 95% stenosis of the left circumflex coronary artery just  before takeoff of the first and second circumflex marginal branches.  There  is 60-70% stenosis of the mid left anterior descending coronary artery.  There is 100% occlusion of the mid right coronary artery with collateral  filling via left coronary injection of  the distal right coronary  artery and  posterior descending artery.  There is diffuse atherosclerosis involving the  distal infrarenal abdominal aorta with 80% stenosis of the right renal  artery.   IMPRESSION:  Severe three-vessel coronary artery disease with 60% left main  coronary stenosis, status post acute non-Q-wave myocardial infarction with  previous inferior myocardial infarction and overall ejection fraction  estimated at 50%.  I believe that this long-term therapy would probably be  coronary artery bypass grafting for complete surgical revascularization.  The risks of surgery will be somewhat increased due to Mr. Fuston  advanced age, his morbid obesity, his underlying chronic lung disease, as  well as type 2 diabetes mellitus, cerebrovascular disease, and hypertension.   PLAN:  I have outlined options at length with Mr. Delsanto and his family.  Alternative treatment strategies have been discussed.  They understand and  accept associated risks of surgery, including, but not limited to risk of  death, stroke, myocardial infarction, respiratory failure, pneumonia,  bleeding requiring blood transfusion, arrhythmia, infection, and recurrent  coronary artery disease.  We tentatively plan to proceed with more detailed  pulmonary function evaluation, including reference of pulmonary function  tests apparently performed within the last few months here at [facility].  We will obtain chest x-ray, as well as a blood gas on room air.  Presuming that his pulmonary function is not prohibitive, we will  tentatively plan to proceed with surgery early next week.  All of the  questions have been addressed.                                               Darrell Livingston, M.D.   CHO/MEDQ  D:  04/16/2003  T:  04/17/2003  Job:  [id number]   cc:   Darrell Livingston, M.D.  [address] [place], [place] [address]  Fax: [phone]

## 2010-12-30 NOTE — Op Note (Signed)
[place]  Patient:    Darrell Livingston                    MRN: [id number] Proc. Date: 06/28/99 Adm. Date:  [id number] Attending:  Kandis Mannan CC:         Hal T. Stoneking, M.D.             Francisca December, M.D.             Thomas B. Samuella Cota, M.D.                           Operative Report  CCS NUMBER:  [id number].  PREOPERATIVE DIAGNOSES: 1. Umbilical hernia. 2. Acute and chronic cholecystitis with cholelithiasis.  POSTOPERATIVE DIAGNOSES: 1. Umbilical hernia. 2. Acute and chronic cholecystitis with cholelithiasis.  OPERATIONS: 1. Repair of umbilical hernia. 2. Open cholecystectomy.  SURGEON:  Maisie Fus B. Samuella Cota, M.D.  ASSISTANT:  Anselm Pancoast. Zachery Dakins, M.D.  ANESTHESIOLOGIST:  Kaylyn Layer. Michelle Piper, M.D. and CRNA.  ANESTHESIA:  General.  DESCRIPTION OF PROCEDURE:  The patient was taken to the operating room and placed on the table in supine position.  After satisfactory general anesthetic with intubation, the entire abdomen was prepped and draped in a sterile field.  The patient had about a two fingerbreadth umbilical hernia and the skin was bulging at the umbilicus.  A midline incision was made through the umbilicus.  The hernia contained omentum and fatty tissue and this was dissected free and the fascia identified.  A finger was placed into the peritoneal cavity.  A purse-string suture of 0 Vicryl was placed and the Hasson trocar was placed into the abdomen.  The abdomen was then  insufflated to 14 mmHg pressure.  The patient had some adhesions in the right side of his abdomen and also just beneath the abdominal wall near the umbilicus.  A second 10 mm trocar was placed just to the right of the midline in the subxiphoid area.  A 5 mm trocar was placed laterally after some adhesions laterally had been taken down.  The gallbladder was identified by the patient had marked omentum and other tissue adherent to the gallbladder.   It was obvious that we would be able to dissect this safely laparoscopically.  The decision was then made to open the abdomen for cholecystectomy.  The umbilical hernia was then closed by closing the fascia vertically with four  figure-of-eight sutures of 0 Prolene.  The right subcostal incision was then made beginning at the site of the previous trocar site in the upper mid-abdomen.  The incision was taken through skin and subcutaneous tissue with subcutaneous bleeders being cauterized and bovied. The anterior rectus sheath was divided and the rectus muscle divided with bleeders being ligated with 2-0 black silk or 0 Vicryl.  The posterior rectus sheath and peritoneum were then incised.  Peritoneal cavity entered.  The patient still had some adhesions between the subcostal incision and umbilicus which were not taken down.  The patient had a markedly distended gallbladder and the gallbladder was decompressed.  The omentum was quite adherent to the gallbladder and it was necessary to use the cautery to get most of the adhesions off the gallbladder. The gallbladder was taken down retrograde beginning at the fundus toward the cystic  duct.  Dissection at the cystic duct area was carried out.  We were finally able to free up all structures so that only  the cystic duct and gallbladder were attached o the common duct.  The cystic artery had been triply clipped and divided.  After the gallbladder had been dissected free, a small clamp was placed into the gallbladder and into the  cystic duct which was quite small.  The common duct was really never seen but we had certain adequate length on the cystic duct.  The cystic duct was then clamped, divided, and the cystic duct doubly ligated with 0 Vicryl.  The gallbladder bed was irrigated.  Hemostasis was obtained.  The thickness of the gallbladder wall in some parts was probably 7 mm.  A 10 Jackson-Pratt drain was inserted through  the 5 mm trocar in the right upper quadrant abdomen and placed in the subhepatic space.  The adherent fatty tissue, which had been peeled away from the gallbladder, was then noted to be lie back ust beneath the liver.  The drain was sutured to the skin using 3-0 nylon.  Abdomen was copiously irrigated.  The fascial repair of the umbilicus was felt inside and felt secure.  Peritoneum and posterior rectus sheath were then closed with two running sutures of 0 PDS with the knot tied in the middle.  The anterior rectus sheath was then closed with two running sutures of #1 PDS with the knots  tied in the middle.  Subcutaneous tissues were irrigated.  The skin was closed ith skin staples and skin of the umbilicus was also closed with skin staples.  Dry,  sterile dressings were applied.  The patient seemed to tolerate the procedure well and was taken to the PACU in satisfactory condition.DD:  06/28/99 TD:  06/29/99 Job: [id number]

## 2010-12-30 NOTE — Op Note (Signed)
   NAME:  Darrell Livingston, Darrell Livingston                      ACCOUNT NO.:  [id number]   MEDICAL RECORD NO.:  [id number]                   PATIENT TYPE:  INP   LOCATION:  2309                                 FACILITY:  [facility]   PHYSICIAN:  Kaylyn Layer. Michelle Piper, M.D.                DATE OF BIRTH:  [birth year]   DATE OF PROCEDURE:  04/21/2003  DATE OF DISCHARGE:                                 OPERATIVE REPORT   PROCEDURE:  Transesophageal echocardiographic probe insertion and monitoring  during coronary artery bypass grafting.   ANESTHESIOLOGIST:  Kaylyn Layer. Michelle Piper, M.D.   BODY OF REPORT:  I was consulted by Dr. Salvatore Decent. Cornelius Moras to place the  transesophageal echocardiographic probe into [name] to evaluate his  left ventricular function.   After uneventful induction of anesthesia and endotracheal intubation, the  transesophageal echocardiographic probe was easily passed into his stomach.  Initial short-axis view of the left ventricle showed mild left ventricular  hypertrophy with no wall motion abnormalities.  The ventricle seemed to  contract well in all quadrants.  Turning to the four-chamber view, initially  looking at the mitral valve, the valve leaflet coapted normally.  There was  mild mitral valve prolapse and on placing color-flow Doppler across the  valve, there was a very small trace of mitral regurgitation.  Left atrium  looked normal and flow in the pulmonary veins was forward.  Turning to the  left ventricular outflow tract, it was normal.  The aortic valve was  tricuspid and normal.  In the longitudinal views, there was some  supravalvular turbulence but no aortic insufficiency.  Intra-atrial septum  was normal and the right side of the heart was normal.   The patient was placed on cardiopulmonary bypass by Dr. Cornelius Moras and underwent  coronary artery bypass grafting.  He was discontinued uneventfully from  bypass machine and evaluation at this time was unchanged from preop.  Specifically,  there was minimal mitral regurgitation and structurally, the  mitral and aortic valves looked normal.  Left ventricular function was  unchanged and there were no wall motion abnormalities.  At the end of the  procedure, the transesophageal echocardiographic probe was removed from the  patient and he was taken in stable condition, intubated, to the intensive  care unit.                                               Kaylyn Layer Michelle Piper, M.D.    KDO/MEDQ  D:  04/21/2003  T:  04/22/2003  Job:  [id number]   cc:   [facility]

## 2011-01-02 ENCOUNTER — Ambulatory Visit (HOSPITAL_COMMUNITY): Payer: Self-pay

## 2011-01-03 ENCOUNTER — Encounter (HOSPITAL_COMMUNITY): Payer: Self-pay

## 2011-01-04 ENCOUNTER — Ambulatory Visit (HOSPITAL_COMMUNITY): Payer: Self-pay

## 2011-01-05 ENCOUNTER — Encounter (HOSPITAL_COMMUNITY): Payer: Self-pay

## 2011-01-06 ENCOUNTER — Ambulatory Visit (HOSPITAL_COMMUNITY): Payer: Self-pay

## 2011-01-09 ENCOUNTER — Ambulatory Visit (HOSPITAL_COMMUNITY): Payer: Self-pay

## 2011-01-10 ENCOUNTER — Encounter (HOSPITAL_COMMUNITY): Payer: Self-pay

## 2011-01-11 ENCOUNTER — Ambulatory Visit (HOSPITAL_COMMUNITY): Payer: Self-pay

## 2011-01-12 ENCOUNTER — Encounter (HOSPITAL_COMMUNITY): Payer: Self-pay

## 2011-01-13 ENCOUNTER — Ambulatory Visit (HOSPITAL_COMMUNITY): Payer: Self-pay

## 2011-01-16 ENCOUNTER — Ambulatory Visit (HOSPITAL_COMMUNITY): Payer: Self-pay

## 2011-01-17 ENCOUNTER — Encounter (HOSPITAL_COMMUNITY): Payer: Self-pay

## 2011-01-18 ENCOUNTER — Ambulatory Visit (HOSPITAL_COMMUNITY): Payer: Self-pay

## 2011-01-19 ENCOUNTER — Encounter (HOSPITAL_COMMUNITY): Payer: Self-pay

## 2011-01-20 ENCOUNTER — Ambulatory Visit (HOSPITAL_COMMUNITY): Payer: Self-pay

## 2011-01-24 ENCOUNTER — Encounter (HOSPITAL_COMMUNITY): Payer: Self-pay

## 2011-01-26 ENCOUNTER — Encounter (HOSPITAL_COMMUNITY): Payer: Self-pay

## 2011-01-31 ENCOUNTER — Encounter (HOSPITAL_COMMUNITY): Payer: Self-pay

## 2011-02-02 ENCOUNTER — Encounter (HOSPITAL_COMMUNITY): Payer: Self-pay

## 2011-02-07 ENCOUNTER — Encounter (HOSPITAL_COMMUNITY): Payer: Self-pay

## 2011-02-09 ENCOUNTER — Encounter (HOSPITAL_COMMUNITY): Payer: Self-pay

## 2011-02-14 ENCOUNTER — Encounter (HOSPITAL_COMMUNITY): Payer: Self-pay

## 2011-02-16 ENCOUNTER — Encounter (HOSPITAL_COMMUNITY): Payer: Self-pay

## 2011-02-21 ENCOUNTER — Encounter (HOSPITAL_COMMUNITY): Payer: Self-pay

## 2011-02-23 ENCOUNTER — Encounter (HOSPITAL_COMMUNITY): Payer: Self-pay

## 2011-02-28 ENCOUNTER — Encounter (HOSPITAL_COMMUNITY): Payer: Self-pay

## 2011-03-02 ENCOUNTER — Encounter (HOSPITAL_COMMUNITY): Payer: Self-pay

## 2011-03-07 ENCOUNTER — Encounter (HOSPITAL_COMMUNITY): Payer: Self-pay

## 2011-03-09 ENCOUNTER — Encounter (HOSPITAL_COMMUNITY): Payer: Self-pay

## 2011-03-14 ENCOUNTER — Encounter (HOSPITAL_COMMUNITY): Payer: Self-pay

## 2011-03-16 ENCOUNTER — Encounter (HOSPITAL_COMMUNITY): Payer: Self-pay

## 2011-03-21 ENCOUNTER — Encounter (HOSPITAL_COMMUNITY): Payer: Self-pay

## 2011-03-23 ENCOUNTER — Encounter (HOSPITAL_COMMUNITY): Payer: Self-pay

## 2011-03-28 ENCOUNTER — Encounter (HOSPITAL_COMMUNITY): Payer: Self-pay

## 2011-03-30 ENCOUNTER — Encounter (HOSPITAL_COMMUNITY): Payer: Self-pay

## 2011-04-04 ENCOUNTER — Encounter (HOSPITAL_COMMUNITY): Payer: Self-pay

## 2011-04-06 ENCOUNTER — Encounter (HOSPITAL_COMMUNITY): Payer: Self-pay

## 2011-04-11 ENCOUNTER — Encounter (HOSPITAL_COMMUNITY): Payer: Self-pay

## 2011-04-13 ENCOUNTER — Encounter (HOSPITAL_COMMUNITY): Payer: Self-pay

## 2011-04-18 ENCOUNTER — Encounter (HOSPITAL_COMMUNITY): Payer: Self-pay

## 2011-04-20 ENCOUNTER — Encounter (HOSPITAL_COMMUNITY): Payer: Self-pay

## 2011-04-25 ENCOUNTER — Encounter (HOSPITAL_COMMUNITY): Payer: Self-pay

## 2011-04-27 ENCOUNTER — Encounter (HOSPITAL_COMMUNITY): Payer: Self-pay

## 2011-05-02 ENCOUNTER — Encounter (HOSPITAL_COMMUNITY): Payer: Self-pay

## 2011-05-04 ENCOUNTER — Encounter (HOSPITAL_COMMUNITY): Payer: Self-pay

## 2011-05-09 ENCOUNTER — Encounter (HOSPITAL_COMMUNITY): Payer: Self-pay

## 2011-05-11 ENCOUNTER — Encounter (HOSPITAL_COMMUNITY): Payer: Self-pay

## 2011-05-16 ENCOUNTER — Encounter (HOSPITAL_COMMUNITY): Payer: Self-pay

## 2011-05-18 ENCOUNTER — Encounter (HOSPITAL_COMMUNITY): Payer: Self-pay

## 2011-05-23 ENCOUNTER — Encounter (HOSPITAL_COMMUNITY): Payer: Self-pay

## 2011-05-25 ENCOUNTER — Encounter (HOSPITAL_COMMUNITY): Payer: Self-pay

## 2011-05-30 ENCOUNTER — Encounter (HOSPITAL_COMMUNITY): Payer: Self-pay

## 2011-06-01 ENCOUNTER — Encounter (HOSPITAL_COMMUNITY): Payer: Self-pay

## 2011-06-06 ENCOUNTER — Encounter (HOSPITAL_COMMUNITY): Payer: Self-pay

## 2011-06-08 ENCOUNTER — Encounter (HOSPITAL_COMMUNITY): Payer: Self-pay

## 2011-06-13 ENCOUNTER — Encounter (HOSPITAL_COMMUNITY): Payer: Self-pay

## 2011-06-15 ENCOUNTER — Encounter (HOSPITAL_COMMUNITY): Payer: Self-pay

## 2011-06-20 ENCOUNTER — Encounter (HOSPITAL_COMMUNITY): Payer: Self-pay

## 2011-06-22 ENCOUNTER — Encounter (HOSPITAL_COMMUNITY): Payer: Self-pay

## 2011-06-27 ENCOUNTER — Encounter (HOSPITAL_COMMUNITY): Payer: Self-pay

## 2011-06-29 ENCOUNTER — Encounter (HOSPITAL_COMMUNITY): Payer: Self-pay

## 2011-07-04 ENCOUNTER — Encounter (HOSPITAL_COMMUNITY): Payer: Self-pay

## 2011-07-06 ENCOUNTER — Encounter (HOSPITAL_COMMUNITY): Payer: Self-pay

## 2011-07-11 ENCOUNTER — Encounter (HOSPITAL_COMMUNITY): Payer: Self-pay

## 2011-07-13 ENCOUNTER — Encounter (HOSPITAL_COMMUNITY): Payer: Self-pay

## 2011-07-18 ENCOUNTER — Encounter (HOSPITAL_COMMUNITY): Payer: Self-pay

## 2011-07-20 ENCOUNTER — Inpatient Hospital Stay (HOSPITAL_COMMUNITY)
Admission: EM | Admit: 2011-07-20 | Discharge: 2011-07-25 | DRG: 189 | Disposition: A | Discharge status: Home / Self Care | Payer: Medicare Other | Attending: Family Medicine | Admitting: Family Medicine

## 2011-07-20 ENCOUNTER — Other Ambulatory Visit: Payer: Self-pay

## 2011-07-20 ENCOUNTER — Encounter (HOSPITAL_COMMUNITY): Payer: Self-pay

## 2011-07-20 ENCOUNTER — Emergency Department (HOSPITAL_COMMUNITY): Payer: Medicare Other

## 2011-07-20 DIAGNOSIS — J441 Chronic obstructive pulmonary disease with (acute) exacerbation: Secondary | ICD-10-CM

## 2011-07-20 DIAGNOSIS — E876 Hypokalemia: Secondary | ICD-10-CM

## 2011-07-20 DIAGNOSIS — I10 Essential (primary) hypertension: Secondary | ICD-10-CM

## 2011-07-20 DIAGNOSIS — E119 Type 2 diabetes mellitus without complications: Secondary | ICD-10-CM

## 2011-07-20 DIAGNOSIS — R0902 Hypoxemia: Secondary | ICD-10-CM

## 2011-07-20 DIAGNOSIS — I2510 Atherosclerotic heart disease of native coronary artery without angina pectoris: Secondary | ICD-10-CM | POA: Diagnosis present

## 2011-07-20 DIAGNOSIS — J9600 Acute respiratory failure, unspecified whether with hypoxia or hypercapnia: Principal | ICD-10-CM | POA: Diagnosis present

## 2011-07-20 DIAGNOSIS — R05 Cough: Secondary | ICD-10-CM

## 2011-07-20 DIAGNOSIS — J449 Chronic obstructive pulmonary disease, unspecified: Secondary | ICD-10-CM

## 2011-07-20 DIAGNOSIS — R0689 Other abnormalities of breathing: Secondary | ICD-10-CM

## 2011-07-20 DIAGNOSIS — J8410 Pulmonary fibrosis, unspecified: Secondary | ICD-10-CM

## 2011-07-20 DIAGNOSIS — Z87891 Personal history of nicotine dependence: Secondary | ICD-10-CM

## 2011-07-20 DIAGNOSIS — Z9981 Dependence on supplemental oxygen: Secondary | ICD-10-CM

## 2011-07-20 DIAGNOSIS — R6889 Other general symptoms and signs: Secondary | ICD-10-CM | POA: Diagnosis present

## 2011-07-20 DIAGNOSIS — R0602 Shortness of breath: Secondary | ICD-10-CM

## 2011-07-20 DIAGNOSIS — R059 Cough, unspecified: Secondary | ICD-10-CM

## 2011-07-20 HISTORY — DX: Type 2 diabetes mellitus without complications: E11.9

## 2011-07-20 HISTORY — DX: Essential (primary) hypertension: I10

## 2011-07-20 HISTORY — DX: Chronic obstructive pulmonary disease, unspecified: J44.9

## 2011-07-20 HISTORY — DX: Heart disease, unspecified: I51.9

## 2011-07-20 HISTORY — DX: Malignant neoplasm of unspecified part of unspecified bronchus or lung: C34.90

## 2011-07-20 HISTORY — DX: Atherosclerotic heart disease of native coronary artery without angina pectoris: I25.10

## 2011-07-20 LAB — CARDIAC PANEL(CRET KIN+CKTOT+MB+TROPI)
Relative Index: 3.4 — ABNORMAL HIGH (ref 0.0–2.5)
Total CK: 348 U/L — ABNORMAL HIGH (ref 7–232)
Troponin I: <0.3 ng/mL (ref <0.30)

## 2011-07-20 LAB — CBC
Hemoglobin: 15.1 g/dL (ref 13.0–17.0)
MCH: 29.7 pg (ref 26.0–34.0)
MCV: 91 fL (ref 78.0–100.0)
RBC: 5.09 MIL/uL (ref 4.22–5.81)

## 2011-07-20 LAB — BASIC METABOLIC PANEL
Calcium: 8.9 mg/dL (ref 8.4–10.5)
Creatinine, Ser: 0.68 mg/dL (ref 0.50–1.35)
GFR calc Af Amer: >90 mL/min (ref >90)

## 2011-07-20 MED ORDER — BUDESONIDE-FORMOTEROL FUMARATE 160-4.5 MCG/ACT IN AERO
2.0000 | INHALATION_SPRAY | Freq: Two times a day (BID) | RESPIRATORY_TRACT | Status: DC
  Administered 2011-07-20 – 2011-07-25 (×10): 2 via RESPIRATORY_TRACT
  Filled 2011-07-20: qty 6

## 2011-07-20 MED ORDER — GUAIFENESIN ER 600 MG PO TB12
1200.0000 mg | ORAL_TABLET | Freq: Two times a day (BID) | ORAL | Status: DC
  Filled 2011-07-20: qty 2

## 2011-07-20 MED ORDER — ROSUVASTATIN CALCIUM 5 MG PO TABS
5.0000 mg | ORAL_TABLET | Freq: Every day | ORAL | Status: DC
  Administered 2011-07-20 – 2011-07-25 (×6): 5 mg via ORAL
  Filled 2011-07-20 (×7): qty 1

## 2011-07-20 MED ORDER — ACETAMINOPHEN 650 MG RE SUPP: 650.0000 mg | Freq: Four times a day (QID) | RECTAL | Status: DC | PRN

## 2011-07-20 MED ORDER — LISINOPRIL 20 MG PO TABS
20.0000 mg | ORAL_TABLET | Freq: Every day | ORAL | Status: DC
  Administered 2011-07-20 – 2011-07-25 (×6): 20 mg via ORAL
  Filled 2011-07-20 (×7): qty 1

## 2011-07-20 MED ORDER — METHYLPREDNISOLONE SODIUM SUCC 125 MG IJ SOLR
60.0000 mg | Freq: Three times a day (TID) | INTRAMUSCULAR | Status: DC
  Administered 2011-07-20 – 2011-07-24 (×13): 60 mg via INTRAVENOUS
  Filled 2011-07-20 (×16): qty 2

## 2011-07-20 MED ORDER — ALBUTEROL SULFATE (5 MG/ML) 0.5% IN NEBU
2.5000 mg | INHALATION_SOLUTION | RESPIRATORY_TRACT | Status: DC | PRN
  Filled 2011-07-20 (×4): qty 0.5

## 2011-07-20 MED ORDER — ALBUTEROL SULFATE (5 MG/ML) 0.5% IN NEBU
5.0000 mg | INHALATION_SOLUTION | Freq: Once | RESPIRATORY_TRACT | Status: AC
  Administered 2011-07-20: 5 mg via RESPIRATORY_TRACT
  Filled 2011-07-20: qty 1

## 2011-07-20 MED ORDER — DOCUSATE SODIUM 100 MG PO CAPS
100.0000 mg | ORAL_CAPSULE | Freq: Every day | ORAL | Status: DC | PRN
  Filled 2011-07-20 (×2): qty 1

## 2011-07-20 MED ORDER — INSULIN ASPART 100 UNIT/ML ~~LOC~~ SOLN
0.0000 [IU] | Freq: Three times a day (TID) | SUBCUTANEOUS | Status: DC
  Administered 2011-07-21 (×2): 2 [IU] via SUBCUTANEOUS
  Administered 2011-07-21 – 2011-07-22 (×3): 1 [IU] via SUBCUTANEOUS
  Administered 2011-07-22: 2 [IU] via SUBCUTANEOUS
  Administered 2011-07-23 – 2011-07-24 (×4): 1 [IU] via SUBCUTANEOUS
  Filled 2011-07-20: qty 3

## 2011-07-20 MED ORDER — ONDANSETRON HCL 4 MG/2ML IJ SOLN: 4.0000 mg | Freq: Four times a day (QID) | INTRAMUSCULAR | Status: DC | PRN

## 2011-07-20 MED ORDER — DM-GUAIFENESIN ER 30-600 MG PO TB12
2.0000 | ORAL_TABLET | Freq: Two times a day (BID) | ORAL | Status: DC
  Administered 2011-07-20 – 2011-07-25 (×10): 2 via ORAL
  Filled 2011-07-20 (×14): qty 2

## 2011-07-20 MED ORDER — ACETAMINOPHEN 325 MG PO TABS: 650.0000 mg | ORAL_TABLET | Freq: Four times a day (QID) | ORAL | Status: DC | PRN

## 2011-07-20 MED ORDER — POTASSIUM CHLORIDE CRYS ER 20 MEQ PO TBCR
40.0000 meq | EXTENDED_RELEASE_TABLET | Freq: Once | ORAL | Status: AC
  Administered 2011-07-20: 40 meq via ORAL
  Filled 2011-07-20: qty 2

## 2011-07-20 MED ORDER — HYDROCHLOROTHIAZIDE 12.5 MG PO CAPS
12.5000 mg | ORAL_CAPSULE | Freq: Every day | ORAL | Status: DC
  Administered 2011-07-20 – 2011-07-25 (×6): 12.5 mg via ORAL
  Filled 2011-07-20 (×7): qty 1

## 2011-07-20 MED ORDER — LEVOFLOXACIN IN D5W 750 MG/150ML IV SOLN
750.0000 mg | INTRAVENOUS | Status: DC
  Administered 2011-07-20 – 2011-07-23 (×4): 750 mg via INTRAVENOUS
  Filled 2011-07-20 (×5): qty 150

## 2011-07-20 MED ORDER — VITAMIN D3 25 MCG (1000 UNIT) PO TABS
1000.0000 [IU] | ORAL_TABLET | Freq: Every day | ORAL | Status: DC
  Administered 2011-07-21 – 2011-07-25 (×5): 1000 [IU] via ORAL
  Filled 2011-07-20 (×6): qty 1

## 2011-07-20 MED ORDER — HYDROCODONE-ACETAMINOPHEN 5-325 MG PO TABS: 1.0000 | ORAL_TABLET | ORAL | Status: DC | PRN

## 2011-07-20 MED ORDER — TIOTROPIUM BROMIDE MONOHYDRATE 18 MCG IN CAPS
18.0000 ug | ORAL_CAPSULE | Freq: Every day | RESPIRATORY_TRACT | Status: DC
  Administered 2011-07-21 – 2011-07-25 (×5): 18 ug via RESPIRATORY_TRACT
  Filled 2011-07-20: qty 5

## 2011-07-20 MED ORDER — HEPARIN SODIUM (PORCINE) 5000 UNIT/ML IJ SOLN
5000.0000 [IU] | Freq: Three times a day (TID) | INTRAMUSCULAR | Status: DC
  Administered 2011-07-20 – 2011-07-25 (×15): 5000 [IU] via SUBCUTANEOUS
  Filled 2011-07-20 (×22): qty 1

## 2011-07-20 MED ORDER — DM-GUAIFENESIN ER 30-600 MG PO TB12: 1.0000 | ORAL_TABLET | Freq: Two times a day (BID) | ORAL | Status: DC

## 2011-07-20 MED ORDER — LISINOPRIL-HYDROCHLOROTHIAZIDE 20-12.5 MG PO TABS: 1.0000 | ORAL_TABLET | Freq: Every day | ORAL | Status: DC

## 2011-07-20 MED ORDER — ASPIRIN 325 MG PO TABS
325.0000 mg | ORAL_TABLET | Freq: Every day | ORAL | Status: DC
  Administered 2011-07-20 – 2011-07-25 (×6): 325 mg via ORAL
  Filled 2011-07-20 (×7): qty 1

## 2011-07-20 MED ORDER — ONDANSETRON HCL 4 MG PO TABS: 4.0000 mg | ORAL_TABLET | Freq: Four times a day (QID) | ORAL | Status: DC | PRN

## 2011-07-20 MED ORDER — METFORMIN HCL 500 MG PO TABS
500.0000 mg | ORAL_TABLET | Freq: Two times a day (BID) | ORAL | Status: DC
  Administered 2011-07-21 – 2011-07-25 (×9): 500 mg via ORAL
  Filled 2011-07-20 (×12): qty 1

## 2011-07-20 MED ORDER — FUROSEMIDE 10 MG/ML IJ SOLN
40.0000 mg | Freq: Once | INTRAMUSCULAR | Status: AC
  Administered 2011-07-21: 40 mg via INTRAVENOUS
  Filled 2011-07-20: qty 4

## 2011-07-20 MED ORDER — ALBUTEROL SULFATE (5 MG/ML) 0.5% IN NEBU
2.5000 mg | INHALATION_SOLUTION | Freq: Four times a day (QID) | RESPIRATORY_TRACT | Status: DC
  Administered 2011-07-20 – 2011-07-25 (×19): 2.5 mg via RESPIRATORY_TRACT
  Filled 2011-07-20 (×16): qty 0.5

## 2011-07-20 NOTE — ED Provider Notes (Addendum)
History     CSN: [id number] Arrival date & time: 07/20/2011  2:56 PM   First MD Initiated Contact with Patient 07/20/11 1527      Chief Complaint  Patient presents with  . Shortness of Breath    low O2 sats   Brought in by son, [place], primary care physician due to low oxygen sats. Known history of COPD, and recently placed on oxygen and also given a Z-Pak. Still coughing up yellow phlegm and having persistent dyspnea. Occasional twinges of chest pain, none currently. No vomiting, or pedal edema. O2 sats 91% in triage. Remote history of lung cancer. (Consider location/radiation/quality/duration/timing/severity/associated sxs/prior treatment) HPI  Past Medical History  Diagnosis Date  . COPD (chronic obstructive pulmonary disease)   . Lung cancer   . Cardiac disorder   . Coronary artery disease   . Hypertension   . Diabetes mellitus     Past Surgical History  Procedure Date  . Cardiac surgery   . Cholecystectomy   . Coronary angioplasty with stent placement   . Eye surgery     History reviewed. No pertinent family history.  History  Substance Use Topics  . Smoking status: Former Smoker    Quit date: 11/17/1996  . Smokeless tobacco: Not on file  . Alcohol Use:       Review of Systems  All other systems reviewed and are negative.    Allergies  Review of patient's allergies indicates no known allergies.  Home Medications   Current Outpatient Rx  Name Route Sig Dispense Refill  . ASPIRIN 325 MG PO TABS Oral Take 325 mg by mouth daily.      . ASPIRIN 81 MG PO CHEW Oral Chew 81 mg by mouth daily.      . AZITHROMYCIN 250 MG PO TABS Oral Take 250 mg by mouth daily.      . BUDESONIDE-FORMOTEROL FUMARATE 160-4.5 MCG/ACT IN AERO Inhalation Inhale 2 puffs into the lungs 2 (two) times daily.      Marland Kitchen VITAMIN D 1000 UNITS PO TABS Oral Take 1,000 Units by mouth daily. Take two tabets     . GUAIFENESIN ER 600 MG PO TB12 Oral Take 1,200 mg by mouth 2 (two) times daily.       Marland Kitchen LISINOPRIL-HYDROCHLOROTHIAZIDE 20-12.5 MG PO TABS Oral Take 1 tablet by mouth daily.      Marland Kitchen METFORMIN HCL 500 MG PO TABS Oral Take 500 mg by mouth 2 (two) times daily with a meal.      . TIOTROPIUM BROMIDE MONOHYDRATE 18 MCG IN CAPS Inhalation Place 18 mcg into inhaler and inhale daily.      Marland Kitchen ROSUVASTATIN CALCIUM 5 MG PO TABS Oral Take 5 mg by mouth daily.        BP 150/69  Pulse 50  Temp(Src) 98.7 F (37.1 C) (Oral)  Resp 15  Ht 5\' 10"  (1.778 m)  Wt 210 lb (95.255 kg)  BMI 30.13 kg/m2  SpO2 96%  Physical Exam  Nursing note and vitals reviewed. Constitutional: He is oriented to person, place, and time. He appears well-developed and well-nourished.  HENT:  Head: Normocephalic and atraumatic.  Eyes: Conjunctivae and EOM are normal. Pupils are equal, round, and reactive to light.  Neck: Neck supple.  Cardiovascular: Normal rate and regular rhythm.  Exam reveals no gallop and no friction rub.   No murmur heard. Pulmonary/Chest: He has wheezes. He has rales. He exhibits no tenderness.  Abdominal: Soft. Bowel sounds are normal. He exhibits no distension. There  is no tenderness. There is no rebound and no guarding.  Musculoskeletal: Normal range of motion.  Neurological: He is alert and oriented to person, place, and time. No cranial nerve deficit. Coordination normal.  Skin: Skin is warm and dry. No rash noted.  Psychiatric: He has a normal mood and affect.    ED Course  Procedures (including critical care time)  Labs Reviewed  BASIC METABOLIC PANEL - Abnormal; Notable for the following:    Potassium 3.4 (*)    Chloride 93 (*)    CO2 35 (*)    Glucose, Bld 106 (*)    GFR calc non Af Amer 83 (*)    All other components within normal limits  BLOOD GAS, ARTERIAL - Abnormal; Notable for the following:    pCO2 arterial 59.7 (*)    pO2, Arterial 71.3 (*)    Bicarbonate 37.8 (*)    Acid-Base Excess 10.8 (*)    All other components within normal limits  CARDIAC PANEL(CRET  KIN+CKTOT+MB+TROPI)   Dg Chest 2 View  07/20/2011  *RADIOLOGY REPORT*  Clinical Data: Shortness of breath, diabetes, cough, history of lung cancer  CHEST - 2 VIEW  Comparison: None.  Findings: There is mild cardiomegaly.  The mediastinum and pulmonary vasculature are within normal limits.  Chronic interstitial fibrotic disease is present, and there is an emphysematous bleb in the right upper lobe.  There are no focal infiltrates or effusions.  There is evidence of prior sternotomy and CABG.  IMPRESSION: Chronic interstitial fibrotic disease and right upper lung emphysematous bleb.  Original Report Authenticated By: Brandon Melnick, M.D.     No diagnosis found.    MDM  Pt is seen and examined;  Initial history and physical completed.  Will follow.          Prabhjot Piscitello A. Patrica Duel, MD 07/20/11 1534   Date: 07/20/2011  Rate: 90  Rhythm: normal sinus rhythm  QRS Axis: right  Intervals: normal  ST/T Wave abnormalities: nonspecific ST/T changes  Conduction Disutrbances:right bundle branch block and left posterior fascicular block  Narrative Interpretation:   Old EKG Reviewed: changes noted  Results for orders placed during the hospital encounter of 07/20/11  CARDIAC PANEL(CRET KIN+CKTOT+MB+TROPI)      Component Value Range   Total CK PENDING  7 - 232 (U/L)   CK, MB PENDING  0.3 - 4.0 (ng/mL)   Troponin I <0.30  <0.30 (ng/mL)   Relative Index PENDING  0.0 - 2.5   BASIC METABOLIC PANEL      Component Value Range   Sodium 135  135 - 145 (mEq/L)   Potassium 3.4 (*) 3.5 - 5.1 (mEq/L)   Chloride 93 (*) 96 - 112 (mEq/L)   CO2 35 (*) 19 - 32 (mEq/L)   Glucose, Bld 106 (*) 70 - 99 (mg/dL)   BUN 12  6 - 23 (mg/dL)   Creatinine, Ser 1.61  0.50 - 1.35 (mg/dL)   Calcium 8.9  8.4 - 09.6 (mg/dL)   GFR calc non Af Amer 83 (*) >90 (mL/min)   GFR calc Af Amer >90  >90 (mL/min)  BLOOD GAS, ARTERIAL      Component Value Range   FIO2 PENDING     O2 Content 3.5     Delivery systems NASAL CANNULA      pH, Arterial 7.417  7.350 - 7.450    pCO2 arterial 59.7 (*) 35.0 - 45.0 (mmHg)   pO2, Arterial 71.3 (*) 80.0 - 100.0 (mmHg)   Bicarbonate 37.8 (*)  20.0 - 24.0 (mEq/L)   TCO2 32.6  0 - 100 (mmol/L)   Acid-Base Excess 10.8 (*) 0.0 - 2.0 (mmol/L)   O2 Saturation 94.2     Patient temperature 98.6     Collection site LEFT BRACHIAL     Drawn by [phone]     Sample type ARTERIAL DRAW     Dg Chest 2 View  07/20/2011  *RADIOLOGY REPORT*  Clinical Data: Shortness of breath, diabetes, cough, history of lung cancer  CHEST - 2 VIEW  Comparison: None.  Findings: There is mild cardiomegaly.  The mediastinum and pulmonary vasculature are within normal limits.  Chronic interstitial fibrotic disease is present, and there is an emphysematous bleb in the right upper lobe.  There are no focal infiltrates or effusions.  There is evidence of prior sternotomy and CABG.  IMPRESSION: Chronic interstitial fibrotic disease and right upper lung emphysematous bleb.  Original Report Authenticated By: Brandon Melnick, M.D.          Narvel Kozub A. Patrica Duel, MD 07/20/11 2344

## 2011-07-20 NOTE — Discharge Planning (Signed)
ED CM noted admission referral stating pt's request for shower chair upon discharge.  ED CM completed handoff to unit CM

## 2011-07-20 NOTE — H&P (Signed)
Darrell Livingston CSN:[id number],MRN:[id number]  Outpatient Primary MD for the patient is Ginette Otto, MD, MD  With History of -  Past Medical History  Diagnosis Date  . COPD (chronic obstructive pulmonary disease)   . Lung cancer   . Cardiac disorder   . Coronary artery disease   . Hypertension   . Diabetes mellitus   . COPD (chronic obstructive pulmonary disease) 07/20/2011  . DM type 2 (diabetes mellitus, type 2) 07/20/2011  . Hypertension 07/20/2011      Past Surgical History  Procedure Date  . Cardiac surgery   . Cholecystectomy   . Coronary angioplasty with stent placement   . Eye surgery     in for   Chief Complaint  Patient presents with  . Shortness of Breath    low O2 sats     HPI  Darrell Livingston is a 77 y.o. male, who has history of COPD, questionable CHF, who started experiencing cough and shortness of breath about a week ago of note his wife had similar symptoms and at resident she is hospitalized with the same problems. According to the patient he saw his primary care physician few days ago and was prescribed home oxygen along with Z-Pak, patient states that despite oxygen and Z-Pak his cough and shortness of breath gradually worsening he saw his primary care physician again today where on 2 L nasal cannula oxygen his pulse oximetry reading was 78%.  Then presented to the ER where he again showed evidence of hypoxia as per his pulse ox and his ABG, chest x-ray did not show any acute findings and I was called to admit the patient.   Review of Systems    In addition to the HPI above,  No Fever-chills, No Headache, No changes with Vision or hearing, No problems swallowing food or Liquids, No Chest pain, + Cough & Shortness of Breath, No Abdominal pain, No Nausea or Vommitting, Bowel movements are regular, No Blood in stool or Urine, No dysuria, No new skin rashes or bruises, No new joints pains-aches,  No new weakness, tingling, numbness in any  extremity, No recent weight gain or loss, No polyuria, polydypsia or polyphagia, No significant Mental Stressors.  A full 10 point Review of Systems was done, except as stated above, all other Review of Systems were negative.   Social History History  Substance Use Topics  . Smoking status: Former Smoker    Quit date: 11/17/1996  . Smokeless tobacco: Not on file  . Alcohol Use:       Family History No CAD at early age  Prior to Admission medications   Medication Sig Start Date End Date Taking? Authorizing Provider  aspirin 325 MG tablet Take 325 mg by mouth daily.     Yes Historical Provider, MD  aspirin 81 MG chewable tablet Chew 81 mg by mouth daily.     Yes Historical Provider, MD  azithromycin (ZITHROMAX) 250 MG tablet Take 250 mg by mouth daily.     Yes Historical Provider, MD  budesonide-formoterol (SYMBICORT) 160-4.5 MCG/ACT inhaler Inhale 2 puffs into the lungs 2 (two) times daily.     Yes Historical Provider, MD  cholecalciferol (VITAMIN D) 1000 UNITS tablet Take 1,000 Units by mouth daily. Take two tabets    Yes Historical Provider, MD  guaiFENesin (MUCINEX) 600 MG 12 hr tablet Take 1,200 mg by mouth 2 (two) times daily.     Yes Historical Provider, MD  lisinopril-hydrochlorothiazide (PRINZIDE,ZESTORETIC) 20-12.5 MG per tablet Take 1 tablet  by mouth daily.     Yes Historical Provider, MD  metFORMIN (GLUCOPHAGE) 500 MG tablet Take 500 mg by mouth 2 (two) times daily with a meal.     Yes Historical Provider, MD  tiotropium (SPIRIVA) 18 MCG inhalation capsule Place 18 mcg into inhaler and inhale daily.     Yes Historical Provider, MD  rosuvastatin (CRESTOR) 5 MG tablet Take 5 mg by mouth daily.      Historical Provider, MD    No Known Allergies  Physical Exam No intake or output data in the 24 hours ending 07/20/11 1743 1. Blood pressure 143/62, pulse 75, temperature 98.7 F (37.1 C), temperature source Oral, resp. rate 17, height 5\' 10"  (1.778 m), weight 95.255 kg (210  lb), SpO2 93.00%.  General elderly male lying in bed in mild SOB,  2. Normal affect and insight, Not Suicidal or Homicidal, Awake Alert, Oriented *3.  3. No F.N deficits, ALL C.Nerves Intact, Strength 5/5 all 4 extremities, Sensation intact all 4 extremities, Plantars down going.  4. Ears and Eyes appear Normal, Conjunctivae clear, PERRLA. Moist Oral Mucosa.  5. Supple Neck, No JVD, No cervical lymphadenopathy appriciated, No Carotid Bruits.  6. Symmetrical Chest wall movement, Mod air movement bilaterally, +ve Bilat Rales and wheezes  7. RRR, No Gallops, Rubs or Murmurs, No Parasternal Heave.  8. Positive Bowel Sounds, Abdomen Soft, Non tender, No organomegaly appriciated,  No rebound -guarding or rigidity.  9. No Cyanosis, Normal Skin Turgor, No Skin Rash or Bruise.  10. Good muscle tone,  joints appear normal , no effusions, Normal ROM.  11. No Palpable Lymph Nodes in Neck or Axillae     Data Review  CBC No results found for this basename: WBC:5,HGB:5,HCT:5,PLT:5,MCV:5,MCH:5,MCHC:5,RDW:5,NEUTRABS:5,LYMPHSABS:5,MONOABS:5,EOSABS:5,BASOSABS:5,BANDABS:5,BANDSABD:5 in the last 168 hours ------------------------------------------------------------------------------------------------------------------ Chemistries   Lab 07/20/11 1533  NA 135  K 3.4*  CL 93*  CO2 35*  GLUCOSE 106*  BUN 12  CREATININE 0.68  CALCIUM 8.9  MG --  AST --  ALT --  ALKPHOS --  BILITOT --   ------------------------------------------------------------------------------------------------------------------ estimated creatinine clearance is 73.9 ml/min (by C-G formula based on Cr of 0.68).   Lab 07/20/11 1535  CKMB 11.7*  TROPONINI <0.30  MYOGLOBIN --   Results for BRYCEN, BEAN (MRN [id number]) as of 07/20/2011 17:32  Ref. Range 07/20/2011 16:08  Sample type No range found ARTERIAL DRAW  Delivery systems No range found NASAL CANNULA  FIO2 No range found PENDING  O2 Content No range  found 3.5  pH, Arterial Latest Range: 7.350-7.450  7.417  pCO2 arterial Latest Range: 35.0-45.0 mmHg 59.7 (HH)  pO2, Arterial Latest Range: 80.0-100.0 mmHg 71.3 (L)  Bicarbonate Latest Range: 20.0-24.0 mEq/L 37.8 (H)  TCO2 Latest Range: 0-100 mmol/L 32.6  Acid-Base Excess Latest Range: 0.0-2.0 mmol/L 10.8 (H)  O2 Saturation No range found 94.2  Patient temperature No range found 98.6  Collection site No range found LEFT BRACHIAL    My personal review of CXR: no acute Cardio-Pulm process  My personal review of EKG: Rhythm NSR, RBBB, No acute ST changes  Assessment & Plan  1. Acute respiratory failure due to COPD exacerbation, URI with failed outpatient a Z-Pak, R/O CHF -tender provide him with oxygen nebulizer treatments, pan culture him put him on IV Solu-Medrol and IV Levaquin, fluid restriction, obtain an echogram and BNP, CBC, have discussed the case with pulmonologist Dr. Marchelle Gearing who sees the patient in the morning.  2. essential hypertension no acute issues we'll continue home medications and monitor on  Tele Bed.  3. DM-2 - on Glucophage, ISS added.  4. Hypokalemia- replaced check in am.  5. Mildly elevated CK & Mb - trop -ve, pain free, EKG stable, ON ASA, follow enzymes.   DVT Prophylaxis  Heparin   AM Labs Ordered, also please review Full Orders Admission, patients condition and plan of care including tests being ordered have been discussed with the patient and sons who indicate understanding and agree with the plan and Code Status.  Code Status No Intubation  Condition GUARDED  The patient is being admitted to the Hospitalist Service.

## 2011-07-20 NOTE — ED Notes (Signed)
ZOX:[id number]<AV> Expected date:07/20/11<BR> Expected time: 2:54 PM<BR> Means of arrival:<BR> Comments:<BR> sob

## 2011-07-20 NOTE — Consult Note (Signed)
Name: Darrell Livingston MRN: [id number] DOB: August 23, 1920  LOS: 0  PULMONARY CONSULT NOTE  History of Present Illness: 77 y/o male with COPD presented to his PCP with cough, dyspnea.  Had recently been started on home O2 and given a z-pack for a COPD flare.  He states that he has known that he has had COPD for years but has managed his dyspnea with with pursed lip breathing.  He has followed with Dr. Angelena Sole at the [facility].  He states that in the last few weeks he has had increasing shortness of breath with "any little exertion" and has recently found to be hypoxemic.  He was started on O2 by his PCP this week and was given a zpack.  However despite this his shortness of breath and cough productive of scant yellow sputum worsened.  He was found to be hypoxemic to 78% on 2L O2 at his PCP's office so he was sent here for admission.  He states that he has not had fever, chest pain, leg swelling or orthopnea lately.  Lines / Drains:  Cultures / Sepsis markers: 12/6 Sputum >> 12/6 Blood cx x2 >>  Antibiotics: 12/6 levaquin >>  Tests / Events: 07/20/11 CXR : a bilateral interstitial changes with two right sided bullae     Past Medical History  Diagnosis Date  . COPD (chronic obstructive pulmonary disease)   . Lung cancer   . Cardiac disorder   . Coronary artery disease   . Hypertension   . Diabetes mellitus   . COPD (chronic obstructive pulmonary disease) 07/20/2011  . DM type 2 (diabetes mellitus, type 2) 07/20/2011  . Hypertension 07/20/2011   Past Surgical History  Procedure Date  . Cardiac surgery   . Cholecystectomy   . Coronary angioplasty with stent placement   . Eye surgery    Prior to Admission medications   Medication Sig Start Date End Date Taking? Authorizing Provider  aspirin 325 MG tablet Take 325 mg by mouth daily.     Yes Historical Provider, MD  aspirin 81 MG chewable tablet Chew 81 mg by mouth daily.     Yes Historical Provider, MD  azithromycin (ZITHROMAX)  250 MG tablet Take 250 mg by mouth daily.     Yes Historical Provider, MD  budesonide-formoterol (SYMBICORT) 160-4.5 MCG/ACT inhaler Inhale 2 puffs into the lungs 2 (two) times daily.     Yes Historical Provider, MD  cholecalciferol (VITAMIN D) 1000 UNITS tablet Take 1,000 Units by mouth daily. Take two tabets    Yes Historical Provider, MD  guaiFENesin (MUCINEX) 600 MG 12 hr tablet Take 1,200 mg by mouth 2 (two) times daily.     Yes Historical Provider, MD  lisinopril-hydrochlorothiazide (PRINZIDE,ZESTORETIC) 20-12.5 MG per tablet Take 1 tablet by mouth daily.     Yes Historical Provider, MD  metFORMIN (GLUCOPHAGE) 500 MG tablet Take 500 mg by mouth 2 (two) times daily with a meal.     Yes Historical Provider, MD  tiotropium (SPIRIVA) 18 MCG inhalation capsule Place 18 mcg into inhaler and inhale daily.     Yes Historical Provider, MD  rosuvastatin (CRESTOR) 5 MG tablet Take 5 mg by mouth daily.      Historical Provider, MD   No Known Allergies History reviewed. No pertinent family history. Social History  reports that he quit smoking about 14 years ago. He has never used smokeless tobacco. He reports that he drinks alcohol. He reports that he does not use illicit drugs.  Review Of  Systems   Gen: Denies fever, chills, weight change, fatigue, night sweats HEENT: Denies blurred vision, double vision, hearing loss, tinnitus, sinus congestion, rhinorrhea, sore throat, neck stiffness, dysphagia PULM: Per HPI, notes shortness of breath, cough, sputum production, but denies hemoptysis, wheezing CV: Denies chest pain, edema, orthopnea, paroxysmal nocturnal dyspnea, palpitations GI: Denies abdominal pain, nausea, vomiting, diarrhea, hematochezia, melena, constipation, change in bowel habits GU: Denies dysuria, hematuria, polyuria, oliguria, urethral discharge Endocrine: Denies hot or cold intolerance, polyuria, polyphagia or appetite change Derm: Denies rash, dry skin, scaling or peeling skin  change Heme: Denies easy bruising, bleeding, bleeding gums Neuro: Denies headache, numbness, weakness, slurred speech, loss of memory or consciousness  Vital Signs:   Filed Vitals:   07/20/11 1900  BP: 159/97  Pulse: 94  Temp: 97.6 F (36.4 C)  Resp: 18  O2: 93% at rest on 4LNC   Physical Examination: Gen: chronically ill appearing, but in no acute distress HEENT: NCAT, PERRL, EOMi, OP clear,  Neck: supple without masses PULM: Late fine insp crackles from bases to 1/2 way up bilaterally, scattered rhonchi CV: distant, no obvious mgr, no clear JVD AB: BS+, soft, nontender, no hsm Ext: warm, no edema, no clubbing, no cyanosis Derm: no rash or skin breakdown Neuro: A&Ox4, CN II-XII intact, strength 5/5 in all 4 extremities Psyche: Normal mood and affect  Labs and Imaging:   CBC    Component Value Date/Time   WBC 6.4 07/20/2011 1704   RBC 5.09 07/20/2011 1704   HGB 15.1 07/20/2011 1704   HCT 46.3 07/20/2011 1704   PLT 165 07/20/2011 1704   MCV 91.0 07/20/2011 1704   MCH 29.7 07/20/2011 1704   MCHC 32.6 07/20/2011 1704   RDW 15.8* 07/20/2011 1704    BMET    Component Value Date/Time   NA 135 07/20/2011 1533   K 3.4* 07/20/2011 1533   CL 93* 07/20/2011 1533   CO2 35* 07/20/2011 1533   GLUCOSE 106* 07/20/2011 1533   BUN 12 07/20/2011 1533   CREATININE 0.68 07/20/2011 1533   CALCIUM 8.9 07/20/2011 1533   GFRNONAA 83* 07/20/2011 1533   GFRAA >90 07/20/2011 1533    abg reviewed: 7.42/59.7/71.3/38  Assessment and Plan:  77 y/o male with known COPD presents with hypoxemic respiratory failure in the setting of increasing cough, sputum production, and shortness of breath in the last few weeks.  Found to have possible fibrotic changes on CXR.  He has a COPD exacerbation but without seeing prior CXR images it is hard to know what is causing the interstitial changes.  DDX includes interstitial edema vs. atypical infection vs. some sort of fibrosis.    COPD with acute exacerbation  (07/20/2011)   Assessment: moderate    Plan:  -agree with solumedrol and levaquin -f/u sputum culture -consider outpatient roflumilast -schedule nebs qid -scheduled tiotropium and symbicort  Hypoxemia (07/20/2011)   Assessment: likely due to COPD with acute exacerbation, but CXR also worrisome for possible fibrosis (see ddx above).  Possibly pulm edema given coronary history and hypertension, agree with 2D TTE and proBNP.   Plan:  -I will give a dose of lasix in the morning at 0600 (12/7) -strict i/o -repeat CXR in AM after lasix (already ordered for 11:30) -consider CT chest if no imaging or clinical change with adequate diuresis -f/u echo, proBNP  Cough:   Plan: -add guaifenesin  PCCM will follow   Heber Fl[place], M.D. Pulmonary and Critical Care Medicine [facility] Pager: [phone]  07/20/2011, 8:44 PM

## 2011-07-20 NOTE — ED Notes (Signed)
Pt brought in by son, after pt was seen at PCP and was told to come to Ed for low O2 sats. Pt has hx of copd and has been recently placed on home O2 and Z-pack. With no improvement.

## 2011-07-21 ENCOUNTER — Inpatient Hospital Stay (HOSPITAL_COMMUNITY): Payer: Medicare Other

## 2011-07-21 DIAGNOSIS — I517 Cardiomegaly: Secondary | ICD-10-CM

## 2011-07-21 LAB — BASIC METABOLIC PANEL
BUN: 12 mg/dL (ref 6–23)
Chloride: 93 mEq/L — ABNORMAL LOW (ref 96–112)
Creatinine, Ser: 0.69 mg/dL (ref 0.50–1.35)
Glucose, Bld: 156 mg/dL — ABNORMAL HIGH (ref 70–99)
Potassium: 3.9 mEq/L (ref 3.5–5.1)

## 2011-07-21 LAB — CARDIAC PANEL(CRET KIN+CKTOT+MB+TROPI)
CK, MB: 9.8 ng/mL (ref 0.3–4.0)
Relative Index: 3.3 — ABNORMAL HIGH (ref 0.0–2.5)
Relative Index: 4.3 — ABNORMAL HIGH (ref 0.0–2.5)
Troponin I: <0.3 ng/mL (ref <0.30)
Troponin I: <0.3 ng/mL (ref <0.30)

## 2011-07-21 LAB — HEMOGLOBIN A1C
Hgb A1c MFr Bld: 6.3 % — ABNORMAL HIGH (ref <5.7)
Mean Plasma Glucose: 134 mg/dL — ABNORMAL HIGH (ref <117)

## 2011-07-21 LAB — GLUCOSE, CAPILLARY
Glucose-Capillary: 160 mg/dL — ABNORMAL HIGH (ref 70–99)
Glucose-Capillary: 191 mg/dL — ABNORMAL HIGH (ref 70–99)
Glucose-Capillary: 131 mg/dL — ABNORMAL HIGH (ref 70–99)

## 2011-07-21 LAB — CBC
HCT: 49.2 % (ref 39.0–52.0)
Hemoglobin: 16 g/dL (ref 13.0–17.0)
MCH: 29.5 pg (ref 26.0–34.0)
MCHC: 32.5 g/dL (ref 30.0–36.0)
MCV: 90.8 fL (ref 78.0–100.0)
RDW: 15.9 % — ABNORMAL HIGH (ref 11.5–15.5)

## 2011-07-21 LAB — BLOOD GAS, ARTERIAL
Acid-Base Excess: 10.8 mmol/L — ABNORMAL HIGH (ref 0.0–2.0)
TCO2: 32.6 mmol/L (ref 0–100)
pCO2 arterial: 59.7 mmHg (ref 35.0–45.0)
pO2, Arterial: 71.3 mmHg — ABNORMAL LOW (ref 80.0–100.0)

## 2011-07-21 LAB — PRO B NATRIURETIC PEPTIDE: Pro B Natriuretic peptide (BNP): 732.8 pg/mL — ABNORMAL HIGH (ref 0–450)

## 2011-07-21 NOTE — Plan of Care (Signed)
Problem: Phase I Progression Outcomes Goal: O2 sats > or equal 90% or at baseline Outcome: Progressing On 4 L O2 [place]

## 2011-07-21 NOTE — Progress Notes (Signed)
Name: Darrell Livingston MRN: [id number] DOB: July 10, 1921  LOS: 1   History of Present Illness: 77 y/o male with COPD presented to his PCP with cough, dyspnea.  Had recently been started on home O2 and given a z-pack for a COPD flare.  He states that he has known that he has had COPD for years but has managed his dyspnea with with pursed lip breathing.  He has followed with Dr. Angelena Sole at the [facility].  He was started on O2 by his PCP this week and was given a zpack.  However despite this his shortness of breath and cough productive of scant yellow sputum worsened.  He was found to be hypoxemic to 78% on 2L O2 at his PCP's office so he was sent here for admission.   pCXR - chronic interstitial changes H/o RUL malignancy (per pt) requiring RT   Cultures / Sepsis markers: 12/6 Sputum >> 12/6 Blood cx x2 >>  Antibiotics: 12/6 levaquin >>  Tests / Events: 07/20/11 CXR : a bilateral interstitial changes with two right sided bullae    Vital Signs:   Filed Vitals:   07/21/11 1332  BP: 133/76  Pulse: 77  Temp: 98.2 F (36.8 C)  Resp: 20  O2: 93% at rest on 4LNC  Improved dyspnea  Physical Examination: Gen: chronically ill appearing, but in no acute distress HEENT: NCAT, PERRL, EOMi, OP clear,  Neck: supple without masses PULM: Late fine insp crackles from bases to 1/2 way up bilaterally, no rhonchi CV: distant, no obvious mgr, no clear JVD AB: BS+, soft, nontender, no hsm Ext: warm, no edema, no clubbing, no cyanosis Derm: no rash or skin breakdown Neuro: A&Ox4, CN II-XII intact, strength 5/5 in all 4 extremities Psyche: Normal mood and affect  Labs and Imaging:   CBC    Component Value Date/Time   WBC 5.3 07/21/2011 0750   RBC 5.42 07/21/2011 0750   HGB 16.0 07/21/2011 0750   HCT 49.2 07/21/2011 0750   PLT 187 07/21/2011 0750   MCV 90.8 07/21/2011 0750   MCH 29.5 07/21/2011 0750   MCHC 32.5 07/21/2011 0750   RDW 15.9* 07/21/2011 0750    BMET    Component Value  Date/Time   NA 138 07/21/2011 0750   K 3.9 07/21/2011 0750   CL 93* 07/21/2011 0750   CO2 36* 07/21/2011 0750   GLUCOSE 156* 07/21/2011 0750   BUN 12 07/21/2011 0750   CREATININE 0.69 07/21/2011 0750   CALCIUM 9.1 07/21/2011 0750   GFRNONAA 82* 07/21/2011 0750   GFRAA >90 07/21/2011 0750    abg reviewed: 7.42/59.7/71.3/38 pCXR - BL chronic interstitial changes  Assessment and Plan:  77 y/o male with known COPD presents with hypoxemic respiratory failure in the setting of increasing cough, sputum production, and shortness of breath in the last few weeks.  Found to have possible fibrotic changes on CXR.  He has a COPD exacerbation but without seeing prior CXR images it is hard to know what is causing the interstitial changes.  DDX includes interstitial edema vs. atypical infection vs. some sort of fibrosis.    COPD with acute exacerbation (07/20/2011)   Assessment: moderate    Plan:  -OK to taper  solumedrol and ct  levaquin -f/u sputum culture -consider outpatient roflumilast -schedule nebs qid -scheduled tiotropium and symbicort  Hypoxemia (07/20/2011)   Assessment: likely due to COPD with acute exacerbation, but CXR also worrisome for possible fibrosis (see ddx above).  Possibly pulm edema given coronary history  and hypertension, agree with 2D TTE and proBNP.   Plan:  -Proceed with CT chest since no imaging or clinical change with adequate diuresis -f/u echo, proBNP  Cough:   Plan: -add guaifenesin  PCCM will follow  Desmond Szabo V.   07/21/2011, 6:56 PM

## 2011-07-21 NOTE — Progress Notes (Signed)
  Echocardiogram 2D Echocardiogram has been performed.  Cathie Beams Deneen 07/21/2011, 12:48 PM

## 2011-07-21 NOTE — Progress Notes (Signed)
Subjective: "Feeling better but still not at baseline." per son.  Otherwise denies any fever, chills, nausea, chest discomfort, palpitations, hemoptysis. Objective: Filed Vitals:   07/21/11 0617 07/21/11 0851 07/21/11 1110 07/21/11 1332  BP: 139/72  120/64 133/76  Pulse: 84   77  Temp: 97.9 F (36.6 C)   98.2 F (36.8 C)  TempSrc: Oral   Oral  Resp: 20   20  Height:      Weight:      SpO2: 94% 93%  91%   Weight change:   Intake/Output Summary (Last 24 hours) at 07/21/11 1727 Last data filed at 07/21/11 1336  Gross per 24 hour  Intake    680 ml  Output   1225 ml  Net   -545 ml    General: Alert, awake, oriented x3, in no acute distress HEENT: No bruits, no goiter.  Heart: Regular rate and rhythm, without murmurs, rubs, gallops.  Lungs: Rhales diffusely heard loudest at the bases, no wheeze  Abdomen: Soft, nontender, nondistended, positive bowel sounds.  Neuro: Grossly intact, nonfocal.   Lab Results:  Basename 07/21/11 0750 07/20/11 1533  NA 138 135  K 3.9 3.4*  CL 93* 93*  CO2 36* 35*  GLUCOSE 156* 106*  BUN 12 12  CREATININE 0.69 0.68  CALCIUM 9.1 8.9  MG -- --  PHOS -- --   No results found for this basename: AST:2,ALT:2,ALKPHOS:2,BILITOT:2,PROT:2,ALBUMIN:2 in the last 72 hours No results found for this basename: LIPASE:2,AMYLASE:2 in the last 72 hours  Basename 07/21/11 0750 07/20/11 1704  WBC 5.3 6.4  NEUTROABS -- --  HGB 16.0 15.1  HCT 49.2 46.3  MCV 90.8 91.0  PLT 187 165    Basename 07/21/11 1520 07/21/11 0750 07/20/11 2247  CKTOTAL 187 239* 293*  CKMB 8.0* 9.5* 9.8*  CKMBINDEX -- -- --  TROPONINI <0.30 <0.30 <0.30    Basename 07/21/11 0204  POCBNP 732.8*   No results found for this basename: DDIMER:2 in the last 72 hours  Basename 07/20/11 2000  HGBA1C 6.3*   No results found for this basename: CHOL:2,HDL:2,LDLCALC:2,TRIG:2,CHOLHDL:2,LDLDIRECT:2 in the last 72 hours No results found for this basename:  TSH,T4TOTAL,FREET3,T3FREE,THYROIDAB in the last 72 hours No results found for this basename: VITAMINB12:2,FOLATE:2,FERRITIN:2,TIBC:2,IRON:2,RETICCTPCT:2 in the last 72 hours  Micro Results: Recent Results (from the past 240 hour(s))  CULTURE, BLOOD (ROUTINE X 2)     Status: Normal (Preliminary result)   Collection Time   07/20/11  5:44 PM      Component Value Range Status Comment   Specimen Description BLOOD   Final    Special Requests BOTTLES DRAWN AEROBIC AND ANAEROBIC   Final    Setup Time [id number]   Final    Culture     Final    Value:        BLOOD CULTURE RECEIVED NO GROWTH TO DATE CULTURE WILL BE HELD FOR 5 DAYS BEFORE ISSUING A FINAL NEGATIVE REPORT   Report Status PENDING   Incomplete   CULTURE, BLOOD (ROUTINE X 2)     Status: Normal (Preliminary result)   Collection Time   07/20/11  6:00 PM      Component Value Range Status Comment   Specimen Description BLOOD   Final    Special Requests BOTTLES DRAWN AEROBIC AND ANAEROBIC   Final    Setup Time [id number]   Final    Culture     Final    Value:        BLOOD CULTURE RECEIVED NO GROWTH  TO DATE CULTURE WILL BE HELD FOR 5 DAYS BEFORE ISSUING A FINAL NEGATIVE REPORT   Report Status PENDING   Incomplete     Studies/Results: Dg Chest 2 View  07/21/2011  *RADIOLOGY REPORT*  Clinical Data: Status post diuresis, evaluate for interval change in interstitial opacities  CHEST - 2 VIEW  Comparison: 07/20/2011  Findings: Chronic interstitial markings/fibrotic changes.  Bullous changes in the right upper lobe.  No findings to suggest interstitial edema.  No pleural effusion or pneumothorax.  Cardiomediastinal silhouette is within normal limits. Postsurgical changes related to prior CABG.  Degenerative changes of the visualized thoracolumbar spine.  Cholecystectomy clips.  IMPRESSION: Chronic interstitial markings/fibrosis.  No findings to suggest interstitial edema.  Original Report Authenticated By: Charline Bills, M.D.   Dg Chest 2  View  07/20/2011  *RADIOLOGY REPORT*  Clinical Data: Shortness of breath, diabetes, cough, history of lung cancer  CHEST - 2 VIEW  Comparison: None.  Findings: There is mild cardiomegaly.  The mediastinum and pulmonary vasculature are within normal limits.  Chronic interstitial fibrotic disease is present, and there is an emphysematous bleb in the right upper lobe.  There are no focal infiltrates or effusions.  There is evidence of prior sternotomy and CABG.  IMPRESSION: Chronic interstitial fibrotic disease and right upper lung emphysematous bleb.  Original Report Authenticated By: Brandon Melnick, M.D.    Medications: I have reviewed the patient's current medications.   Patient Active Hospital Problem List: Acute respiratory failure (07/20/2011) At this point is improving on current drug regimen.  Pulm is on board and will follow their recommendations.  As per above there isn't any pleural effusion or   Pneumothorax.  Findings suggestive of Chronic interstitial markings/fibrosis.   Improving.  Pt on albuterol, spiriva, symbicort   DM type 2 (diabetes mellitus, type 2) (07/20/2011)   Will continue to monitor blood sugar levels.  Pt is currently on slidding scale and metformin.  Will consider obtaining an hemoglobin A1c.  Hypertension (07/20/2011)   Well controlled on current drug regimen.  COPD with acute exacerbation (07/20/2011) Improving.  Pt on albuterol, spiriva, symbicort, and levaquin.  Likely causing number one.  Hypoxemia (07/20/2011)  Currently satting 91% on current drug regimen and O2 supplementation.  No reported increase in O 2 supplementation      LOS: 1 day   Penny Pia M.D.  [facility] 07/21/2011, 5:27 PM

## 2011-07-21 NOTE — Clinical Documentation Improvement (Signed)
CHF DOCUMENTATION CLARIFICATION QUERY  THIS DOCUMENT IS NOT A PERMANENT PART OF THE MEDICAL RECORD  TO RESPOND TO THE THIS QUERY, FOLLOW THE INSTRUCTIONS BELOW:  1. If needed, update documentation for the patient's encounter via the notes activity.  2. Access this query again and click edit on the [employer].  3. After updating, or not, click F2 to complete all highlighted (required) fields concerning your review. Select "additional documentation in the medical record" OR "no additional documentation provided".  4. Click Sign note button.  5. The deficiency will fall out of your InBasket *Please let us know if you are not able to compete this workflow by phone or e-mail (listed below).  Please update your documentation within the medical record to reflect your response to this query.                                                                                    07/21/11  Dear [facility],  In a better effort to capture your patient's severity of illness, reflect appropriate length of stay and utilization of resources, a review of the patient medical record has revealed the following indicators the diagnosis of Heart Failure.     Possible Clinical Conditions?  Pt admitted with Acute respiratory failure and COPD exacerbation  Pt admitted with CHF. Please clarify the the acuity and type of CHF and document in pn and  d/c summary.     Chronic Systolic Congestive Heart Failure Chronic Diastolic Congestive Heart Failure Chronic Systolic & Diastolic Congestive Heart Failure Acute Systolic Congestive Heart Failure Acute Diastolic Congestive Heart Failure Acute Systolic & Diastolic Congestive Heart Failure Acute on Chronic Systolic Congestive Heart Failure Acute on Chronic Diastolic Congestive Heart Failure Acute on Chronic Systolic & Diastolic  Congestive Heart Failure Other Condition________________________________________ Cannot Clinically Determine  Supporting  Information:  Risk Factors: Acute Resp Failure, COPD, CHF  Signs & Symptoms:  SOB  Diagnostics:  Component                    CK Total                CK, MB                                      07/20/2011                    348 (H)                 11.7 (HH)                       07/20/2011                    293 (H)                 9.8 (HH)  07/21/2011                    239 (H)                 9.5 (HH)  07/20/11 CXR Findings: There is mild cardiomegaly.  Treatment: Lasix Linsinopril   Reviewed:  no additional documentation provided 07/24/11 ljh  Thank You,   Based on your clinical judgment, please clarify and document in a progress note and/or discharge summary the clinical condition associated with the following supporting information:  In responding to this query please exercise your independent judgment.  The fact that a query is asked, does not imply that any particular answer is desired or expected.   Enis Slipper  RN, BSN, CCDS Clinical Documentation Specialist [facility] Dept Pager: [phone] / E-mail: [email or web address]  Health Information Management [place]

## 2011-07-22 ENCOUNTER — Inpatient Hospital Stay (HOSPITAL_COMMUNITY): Payer: Medicare Other

## 2011-07-22 DIAGNOSIS — J701 Chronic and other pulmonary manifestations due to radiation: Secondary | ICD-10-CM

## 2011-07-22 LAB — CBC
MCH: 29.5 pg (ref 26.0–34.0)
MCV: 89.4 fL (ref 78.0–100.0)
Platelets: 248 10*3/uL (ref 150–400)
RDW: 15.7 % — ABNORMAL HIGH (ref 11.5–15.5)
WBC: 13.5 10*3/uL — ABNORMAL HIGH (ref 4.0–10.5)

## 2011-07-22 LAB — GLUCOSE, CAPILLARY
Glucose-Capillary: 124 mg/dL — ABNORMAL HIGH (ref 70–99)
Glucose-Capillary: 127 mg/dL — ABNORMAL HIGH (ref 70–99)

## 2011-07-22 LAB — BASIC METABOLIC PANEL
Calcium: 8.9 mg/dL (ref 8.4–10.5)
Chloride: 91 mEq/L — ABNORMAL LOW (ref 96–112)
Creatinine, Ser: 0.64 mg/dL (ref 0.50–1.35)
GFR calc Af Amer: >90 mL/min (ref >90)
Sodium: 132 mEq/L — ABNORMAL LOW (ref 135–145)

## 2011-07-22 LAB — EXPECTORATED SPUTUM ASSESSMENT W GRAM STAIN, RFLX TO RESP C

## 2011-07-22 LAB — HEMOGLOBIN A1C: Hgb A1c MFr Bld: 6.1 % — ABNORMAL HIGH (ref <5.7)

## 2011-07-22 NOTE — Progress Notes (Addendum)
Subjective: Pt states that he feels some slight improvement today but is still somewhat SOB.  Denies any fever or chills or general malaise.  Objective: Filed Vitals:   07/22/11 0651 07/22/11 0817 07/22/11 1231 07/22/11 1430  BP: 159/85   150/64  Pulse: 100   74  Temp: 97.7 F (36.5 C)   97.7 F (36.5 C)  TempSrc: Oral   Oral  Resp: 20   18  Height:      Weight:      SpO2: 91% 95% 88% 91%   Weight change: -2.994 kg (-6 lb 9.6 oz)  Intake/Output Summary (Last 24 hours) at 07/22/11 1553 Last data filed at 07/22/11 1300  Gross per 24 hour  Intake    980 ml  Output    400 ml  Net    580 ml    General: Alert, awake, oriented x3, in no acute distress.  HEENT: No bruits, no goiter.  Heart: Regular rate and rhythm, without murmurs, rubs, gallops.  Lungs: Expiratory Wheezes hear loudest at bases. Prolonged expiratory phase Abdomen: Soft, nontender, nondistended, positive bowel sounds.  Neuro: Grossly intact, nonfocal.   Lab Results:  Basename 07/22/11 0500 07/21/11 0750  NA 132* 138  K 4.3 3.9  CL 91* 93*  CO2 31 36*  GLUCOSE 124* 156*  BUN 19 12  CREATININE 0.64 0.69  CALCIUM 8.9 9.1  MG -- --  PHOS -- --   No results found for this basename: AST:2,ALT:2,ALKPHOS:2,BILITOT:2,PROT:2,ALBUMIN:2 in the last 72 hours No results found for this basename: LIPASE:2,AMYLASE:2 in the last 72 hours  Basename 07/22/11 0500 07/21/11 0750  WBC 13.5* 5.3  NEUTROABS -- --  HGB 14.8 16.0  HCT 44.9 49.2  MCV 89.4 90.8  PLT 248 187    Basename 07/21/11 1520 07/21/11 0750 07/20/11 2247  CKTOTAL 187 239* 293*  CKMB 8.0* 9.5* 9.8*  CKMBINDEX -- -- --  TROPONINI <0.30 <0.30 <0.30    Basename 07/21/11 0204  POCBNP 732.8*   No results found for this basename: DDIMER:2 in the last 72 hours  Basename 07/22/11 0500 07/20/11 2000  HGBA1C 6.1* 6.3*   No results found for this basename: CHOL:2,HDL:2,LDLCALC:2,TRIG:2,CHOLHDL:2,LDLDIRECT:2 in the last 72 hours No results found for  this basename: TSH,T4TOTAL,FREET3,T3FREE,THYROIDAB in the last 72 hours No results found for this basename: VITAMINB12:2,FOLATE:2,FERRITIN:2,TIBC:2,IRON:2,RETICCTPCT:2 in the last 72 hours  Micro Results: Recent Results (from the past 240 hour(s))  CULTURE, BLOOD (ROUTINE X 2)     Status: Normal (Preliminary result)   Collection Time   07/20/11  5:44 PM      Component Value Range Status Comment   Specimen Description BLOOD   Final    Special Requests BOTTLES DRAWN AEROBIC AND ANAEROBIC   Final    Setup Time [id number]   Final    Culture     Final    Value:        BLOOD CULTURE RECEIVED NO GROWTH TO DATE CULTURE WILL BE HELD FOR 5 DAYS BEFORE ISSUING A FINAL NEGATIVE REPORT   Report Status PENDING   Incomplete   CULTURE, BLOOD (ROUTINE X 2)     Status: Normal (Preliminary result)   Collection Time   07/20/11  6:00 PM      Component Value Range Status Comment   Specimen Description BLOOD   Final    Special Requests BOTTLES DRAWN AEROBIC AND ANAEROBIC   Final    Setup Time [id number]   Final    Culture     Final  Value:        BLOOD CULTURE RECEIVED NO GROWTH TO DATE CULTURE WILL BE HELD FOR 5 DAYS BEFORE ISSUING A FINAL NEGATIVE REPORT   Report Status PENDING   Incomplete     Studies/Results: Dg Chest 2 View  07/21/2011  *RADIOLOGY REPORT*  Clinical Data: Status post diuresis, evaluate for interval change in interstitial opacities  CHEST - 2 VIEW  Comparison: 07/20/2011  Findings: Chronic interstitial markings/fibrotic changes.  Bullous changes in the right upper lobe.  No findings to suggest interstitial edema.  No pleural effusion or pneumothorax.  Cardiomediastinal silhouette is within normal limits. Postsurgical changes related to prior CABG.  Degenerative changes of the visualized thoracolumbar spine.  Cholecystectomy clips.  IMPRESSION: Chronic interstitial markings/fibrosis.  No findings to suggest interstitial edema.  Original Report Authenticated By: Charline Bills, M.D.    Ct Chest Wo Contrast  07/22/2011  *RADIOLOGY REPORT*  Clinical Data: Pulmonary fibrosis.  CT CHEST WITHOUT CONTRAST  Technique:  Multidetector CT imaging of the chest was performed following the standard protocol without IV contrast.  Comparison: Chest x-ray 07/21/2011  Findings: There are several nodular areas within the lungs.  Plate- like densities seen in the right upper lobe which most likely reflects scarring but has a mild nodularity near the superior right hilum.  Probable scarring also seen in the right middle lobe. There is a nodular subpleural area in the posterior right lower lobe measuring 10 mm.  Areas of nodularity in the posterior left lower lobe, the largest measuring up to 18 mm in diameter.  Otherwise no significant interstitial lung disease.  No areas of fibrosis or honeycombing otherwise.  No pleural effusions.  Prior CABG.  No mediastinal, hilar or axillary adenopathy.  Heart is upper limits normal in size.  Aorta is normal caliber. Visualized thyroid and chest wall soft tissues unremarkable. Imaging into the upper abdomen shows no acute findings.  Prior cholecystectomy.  Low density area in the superior pole of the left kidney likely reflects a small cyst cannot be characterized without IV contrast.  IMPRESSION: Probable scarring in the right upper lobe and right middle lobe. The right upper lobe however does contain some mild nodularity and warrants followup.  There also bilateral lower lobe nodular areas which are nonspecific and warrant followup.  If clinically indicated, these areas could be followed with repeat CT in 6 months.  No evidence of significant interstitial lung disease or fibrosis.  Prior CABG.  Original Report Authenticated By: Cyndie Chime, M.D.    Medications: I have reviewed the patient's current medications.   Patient Active Hospital Problem List: Acute respiratory failure (07/20/2011) At this point is improving on current drug regimen. Pulm is on board and  will follow their recommendations. Pt on albuterol, spiriva, symbicort   DM type 2 (diabetes mellitus, type 2) (07/20/2011) Will continue to monitor blood sugar levels. Pt is currently on slidding scale and metformin. Hemoglobin A1c is 6.1.  Seems like pts blood sugars are well contolled on metformin.  Creatinine level today 0.64  Hypertension (07/20/2011) Pt is on home drug regimen for his hypertension.  At this point will consider waiting and monitoring his blood pressure before adding an agent.  COPD with acute exacerbation (07/20/2011) Improving. Pt on albuterol, spiriva, symbicort, and levaquin. Likely causing number one.      LOS: 2 days   Penny Pia M.D.  [facility] 07/22/2011, 3:53 PM

## 2011-07-22 NOTE — Progress Notes (Signed)
Subjective: Stable overnight, breathing about the same.  No increased wob.  Objective: Vital signs in last 24 hours: Blood pressure 150/64, pulse 74, temperature 97.7 F (36.5 C), temperature source Oral, resp. rate 18, height 5\' 10"  (1.778 m), weight 92.262 kg (203 lb 6.4 oz), SpO2 91.00%.  Intake/Output from previous day: 12/07 0701 - 12/08 0700 In: 1020 [P.O.:720; IV Piggyback:300] Out: 1625 [Urine:1625]   Physical Exam:   ow male in nad Chest with a few scattered crackles, no wheezing, decreased bs. Cor with rrr Alert, oriented, moves all 4.    Lab Results:  Basename 07/22/11 0500 07/21/11 0750 07/20/11 1704  WBC 13.5* 5.3 6.4  HGB 14.8 16.0 15.1  HCT 44.9 49.2 46.3  PLT 248 187 165   BMET  Basename 07/22/11 0500 07/21/11 0750 07/20/11 1533  NA 132* 138 135  K 4.3 3.9 3.4*  CL 91* 93* 93*  CO2 31 36* 35*  GLUCOSE 124* 156* 106*  BUN 19 12 12   CREATININE 0.64 0.69 0.68  CALCIUM 8.9 9.1 8.9    Studies/Results: Dg Chest 2 View  07/21/2011  *RADIOLOGY REPORT*  Clinical Data: Status post diuresis, evaluate for interval change in interstitial opacities  CHEST - 2 VIEW  Comparison: 07/20/2011  Findings: Chronic interstitial markings/fibrotic changes.  Bullous changes in the right upper lobe.  No findings to suggest interstitial edema.  No pleural effusion or pneumothorax.  Cardiomediastinal silhouette is within normal limits. Postsurgical changes related to prior CABG.  Degenerative changes of the visualized thoracolumbar spine.  Cholecystectomy clips.  IMPRESSION: Chronic interstitial markings/fibrosis.  No findings to suggest interstitial edema.  Original Report Authenticated By: Charline Bills, M.D.   Dg Chest 2 View  07/20/2011  *RADIOLOGY REPORT*  Clinical Data: Shortness of breath, diabetes, cough, history of lung cancer  CHEST - 2 VIEW  Comparison: None.  Findings: There is mild cardiomegaly.  The mediastinum and pulmonary vasculature are within normal limits.   Chronic interstitial fibrotic disease is present, and there is an emphysematous bleb in the right upper lobe.  There are no focal infiltrates or effusions.  There is evidence of prior sternotomy and CABG.  IMPRESSION: Chronic interstitial fibrotic disease and right upper lung emphysematous bleb.  Original Report Authenticated By: Brandon Melnick, M.D.   Ct Chest Wo Contrast  07/22/2011  *RADIOLOGY REPORT*  Clinical Data: Pulmonary fibrosis.  CT CHEST WITHOUT CONTRAST  Technique:  Multidetector CT imaging of the chest was performed following the standard protocol without IV contrast.  Comparison: Chest x-ray 07/21/2011  Findings: There are several nodular areas within the lungs.  Plate- like densities seen in the right upper lobe which most likely reflects scarring but has a mild nodularity near the superior right hilum.  Probable scarring also seen in the right middle lobe. There is a nodular subpleural area in the posterior right lower lobe measuring 10 mm.  Areas of nodularity in the posterior left lower lobe, the largest measuring up to 18 mm in diameter.  Otherwise no significant interstitial lung disease.  No areas of fibrosis or honeycombing otherwise.  No pleural effusions.  Prior CABG.  No mediastinal, hilar or axillary adenopathy.  Heart is upper limits normal in size.  Aorta is normal caliber. Visualized thyroid and chest wall soft tissues unremarkable. Imaging into the upper abdomen shows no acute findings.  Prior cholecystectomy.  Low density area in the superior pole of the left kidney likely reflects a small cyst cannot be characterized without IV contrast.  IMPRESSION: Probable scarring  in the right upper lobe and right middle lobe. The right upper lobe however does contain some mild nodularity and warrants followup.  There also bilateral lower lobe nodular areas which are nonspecific and warrant followup.  If clinically indicated, these areas could be followed with repeat CT in 6 months.  No  evidence of significant interstitial lung disease or fibrosis.  Prior CABG.  Original Report Authenticated By: Cyndie Chime, M.D.    Assessment/Plan: Patient Active Hospital Problem List:  COPD (chronic obstructive pulmonary disease) (07/20/2011)   Assessment: the pt is a little better, and no excessive wob currently.  His ct chest does not show ISLD, but does show scarring from prior xrt, and some nodular densities in LL that need to be followed.    Plan: continue steroids, abx.  Will see again on Monday.       Barbaraann Share, M.D. 07/22/2011, 2:58 PM

## 2011-07-23 ENCOUNTER — Other Ambulatory Visit: Payer: Self-pay

## 2011-07-23 LAB — GLUCOSE, CAPILLARY: Glucose-Capillary: 127 mg/dL — ABNORMAL HIGH (ref 70–99)

## 2011-07-23 NOTE — Progress Notes (Signed)
Subjective: " I feel better today."  No other complaints reported.  Denies any fever, chills, increase of cough or sputum production. Objective: Filed Vitals:   07/22/11 2030 07/22/11 2100 07/23/11 0518 07/23/11 0848  BP:  146/81 153/70   Pulse:  57 83   Temp:  98 F (36.7 C) 97.8 F (36.6 C)   TempSrc:  Oral Oral   Resp:  20 20   Height:      Weight:   92.1 kg (203 lb 0.7 oz)   SpO2: 94% 100% 95% 95%   Weight change: -0.162 kg (-5.7 oz)  Intake/Output Summary (Last 24 hours) at 07/23/11 1318 Last data filed at 07/23/11 1100  Gross per 24 hour  Intake    510 ml  Output      0 ml  Net    510 ml    General: Alert, awake, oriented x3, in no acute distress.  HEENT: No bruits, no goiter.  Heart: Regular rate and rhythm, without murmurs, rubs, gallops.  Lungs: Prolongued expiratory phase, no wheezes Abdomen: Soft, nontender, nondistended, positive bowel sounds.  Neuro: Grossly intact, nonfocal.   Lab Results:  Basename 07/22/11 0500 07/21/11 0750  NA 132* 138  K 4.3 3.9  CL 91* 93*  CO2 31 36*  GLUCOSE 124* 156*  BUN 19 12  CREATININE 0.64 0.69  CALCIUM 8.9 9.1  MG -- --  PHOS -- --   No results found for this basename: AST:2,ALT:2,ALKPHOS:2,BILITOT:2,PROT:2,ALBUMIN:2 in the last 72 hours No results found for this basename: LIPASE:2,AMYLASE:2 in the last 72 hours  Basename 07/22/11 0500 07/21/11 0750  WBC 13.5* 5.3  NEUTROABS -- --  HGB 14.8 16.0  HCT 44.9 49.2  MCV 89.4 90.8  PLT 248 187    Basename 07/21/11 1520 07/21/11 0750 07/20/11 2247  CKTOTAL 187 239* 293*  CKMB 8.0* 9.5* 9.8*  CKMBINDEX -- -- --  TROPONINI <0.30 <0.30 <0.30    Basename 07/21/11 0204  POCBNP 732.8*   No results found for this basename: DDIMER:2 in the last 72 hours  Basename 07/22/11 0500 07/20/11 2000  HGBA1C 6.1* 6.3*   No results found for this basename: CHOL:2,HDL:2,LDLCALC:2,TRIG:2,CHOLHDL:2,LDLDIRECT:2 in the last 72 hours No results found for this basename:  TSH,T4TOTAL,FREET3,T3FREE,THYROIDAB in the last 72 hours No results found for this basename: VITAMINB12:2,FOLATE:2,FERRITIN:2,TIBC:2,IRON:2,RETICCTPCT:2 in the last 72 hours  Micro Results: Recent Results (from the past 240 hour(s))  CULTURE, BLOOD (ROUTINE X 2)     Status: Normal (Preliminary result)   Collection Time   07/20/11  5:44 PM      Component Value Range Status Comment   Specimen Description BLOOD   Final    Special Requests BOTTLES DRAWN AEROBIC AND ANAEROBIC   Final    Setup Time [id number]   Final    Culture     Final    Value:        BLOOD CULTURE RECEIVED NO GROWTH TO DATE CULTURE WILL BE HELD FOR 5 DAYS BEFORE ISSUING A FINAL NEGATIVE REPORT   Report Status PENDING   Incomplete   CULTURE, BLOOD (ROUTINE X 2)     Status: Normal (Preliminary result)   Collection Time   07/20/11  6:00 PM      Component Value Range Status Comment   Specimen Description BLOOD   Final    Special Requests BOTTLES DRAWN AEROBIC AND ANAEROBIC   Final    Setup Time [id number]   Final    Culture     Final    Value:  BLOOD CULTURE RECEIVED NO GROWTH TO DATE CULTURE WILL BE HELD FOR 5 DAYS BEFORE ISSUING A FINAL NEGATIVE REPORT   Report Status PENDING   Incomplete   CULTURE, SPUTUM-ASSESSMENT     Status: Normal   Collection Time   07/22/11  7:26 PM      Component Value Range Status Comment   Specimen Description SPUTUM   Final    Special Requests Normal   Final    Sputum evaluation     Final    Value: THIS SPECIMEN IS ACCEPTABLE. RESPIRATORY CULTURE REPORT TO FOLLOW.   Report Status 07/22/2011 FINAL   Final     Studies/Results: Ct Chest Wo Contrast  07/22/2011  *RADIOLOGY REPORT*  Clinical Data: Pulmonary fibrosis.  CT CHEST WITHOUT CONTRAST  Technique:  Multidetector CT imaging of the chest was performed following the standard protocol without IV contrast.  Comparison: Chest x-ray 07/21/2011  Findings: There are several nodular areas within the lungs.  Plate- like densities seen in  the right upper lobe which most likely reflects scarring but has a mild nodularity near the superior right hilum.  Probable scarring also seen in the right middle lobe. There is a nodular subpleural area in the posterior right lower lobe measuring 10 mm.  Areas of nodularity in the posterior left lower lobe, the largest measuring up to 18 mm in diameter.  Otherwise no significant interstitial lung disease.  No areas of fibrosis or honeycombing otherwise.  No pleural effusions.  Prior CABG.  No mediastinal, hilar or axillary adenopathy.  Heart is upper limits normal in size.  Aorta is normal caliber. Visualized thyroid and chest wall soft tissues unremarkable. Imaging into the upper abdomen shows no acute findings.  Prior cholecystectomy.  Low density area in the superior pole of the left kidney likely reflects a small cyst cannot be characterized without IV contrast.  IMPRESSION: Probable scarring in the right upper lobe and right middle lobe. The right upper lobe however does contain some mild nodularity and warrants followup.  There also bilateral lower lobe nodular areas which are nonspecific and warrant followup.  If clinically indicated, these areas could be followed with repeat CT in 6 months.  No evidence of significant interstitial lung disease or fibrosis.  Prior CABG.  Original Report Authenticated By: Cyndie Chime, M.D.    Medications: I have reviewed the patient's current medications.   Patient Active Hospital Problem List: Acute respiratory failure (07/20/2011) At this point is improving on current drug regimen. Pulm is on board and will follow their recommendations.  Pt on albuterol, spiriva, symbicort Noticed that patient was on 5 L supplemental O2 today and reported SO2 was 95%.  Pt was just perscribed oxygen by his Primary care and his setting is at 2 L of Oxygen through [place].  Thus today will work on weaning O2 back to his baseline of 2 L.   DM type 2 (diabetes mellitus, type 2)  (07/20/2011) Will continue to monitor blood sugar levels. Pt is currently on slidding scale and metformin. Hemoglobin A1c is 6.1. Seems like pts blood sugars are well contolled on metformin. Creatinine level today 0.64  Leukocytosis:  Pt is currently afebrile and reports improvement in condition.  At this point most likely is due to Solu-Medrol.  Pt is currently on Levaquin.  Given that his clinical condition is improving and there are no fevers I will hold off on obtaining another chest x ray or urine cultures.  Will revisit tomorrow and order another cbc for  tomorrow morning.  Hypertension (07/20/2011) Pt is on lisinopril-HCTZ, will continue to monitor.  Pt asymptomatic.  COPD with acute exacerbation (07/20/2011) Improving. Pt on albuterol, spiriva, symbicort, and levaquin. Likely causing number one.      LOS: 3 days   Penny Pia M.D.  [facility] 07/23/2011, 1:18 PM

## 2011-07-23 NOTE — Progress Notes (Signed)
CM spoke with pt concerning d/c planning. Per pt [employer] [facility], provides 24 care for wife in home. Pt recently placed on home o2 via PCP last Tuesday. Pt's adult son lives with pt to assist in care. Pt states unsure if any HH services needed. RN notified to make Cm aware when pt's son is available to further discuss d/c planning. Pt has hx of demetia. Previous note entered by CM concerning shower chair. Pt not aware request was made. Will continue to follow in hopes adult son arrives before CM leaves for the day. Roxy Manns Lockie Bothun,RN,BSN,CM [phone]

## 2011-07-23 NOTE — Progress Notes (Signed)
Patient with irregular rhythm between sinus Tach and A-fib on monitor, HR between 110-130s. Patient denies any pain or distress. Vitals 97.4 129/89 22 90% 3L. EKG also done. Tom Callahan-NP informed, on the unit to assess patient.

## 2011-07-24 DIAGNOSIS — J441 Chronic obstructive pulmonary disease with (acute) exacerbation: Secondary | ICD-10-CM

## 2011-07-24 LAB — CBC
HCT: 46.3 % (ref 39.0–52.0)
Hemoglobin: 15.3 g/dL (ref 13.0–17.0)
MCH: 29.2 pg (ref 26.0–34.0)
MCHC: 33 g/dL (ref 30.0–36.0)
MCV: 88.4 fL (ref 78.0–100.0)

## 2011-07-24 LAB — TSH: TSH: 0.212 u[IU]/mL — ABNORMAL LOW (ref 0.350–4.500)

## 2011-07-24 LAB — CARDIAC PANEL(CRET KIN+CKTOT+MB+TROPI)
CK, MB: 9.3 ng/mL (ref 0.3–4.0)
CK, MB: 9.4 ng/mL (ref 0.3–4.0)
Relative Index: 6.9 — ABNORMAL HIGH (ref 0.0–2.5)
Total CK: 141 U/L (ref 7–232)
Troponin I: <0.3 ng/mL (ref <0.30)
Troponin I: <0.3 ng/mL (ref <0.30)

## 2011-07-24 LAB — GLUCOSE, CAPILLARY
Glucose-Capillary: 125 mg/dL — ABNORMAL HIGH (ref 70–99)
Glucose-Capillary: 103 mg/dL — ABNORMAL HIGH (ref 70–99)

## 2011-07-24 LAB — BASIC METABOLIC PANEL
BUN: 21 mg/dL (ref 6–23)
Chloride: 89 mEq/L — ABNORMAL LOW (ref 96–112)
Creatinine, Ser: 0.74 mg/dL (ref 0.50–1.35)
GFR calc Af Amer: >90 mL/min (ref >90)

## 2011-07-24 MED ORDER — PREDNISONE 20 MG PO TABS
40.0000 mg | ORAL_TABLET | Freq: Every day | ORAL | Status: DC
  Administered 2011-07-25: 40 mg via ORAL
  Filled 2011-07-24 (×2): qty 2

## 2011-07-24 MED ORDER — LEVOFLOXACIN 750 MG PO TABS
750.0000 mg | ORAL_TABLET | ORAL | Status: DC
  Administered 2011-07-24: 750 mg via ORAL
  Filled 2011-07-24 (×3): qty 1

## 2011-07-24 NOTE — Progress Notes (Signed)
Subjective: Pt has no new complaints today.  Feeling less short of breath.  Denies every having any chest discomfort.  Objective: Filed Vitals:   07/23/11 2012 07/23/11 2214 07/24/11 0604 07/24/11 0802  BP:  129/89 136/77   Pulse:  93 76   Temp:  97.4 F (36.3 C) 97.8 F (36.6 C)   TempSrc:  Oral Oral   Resp:  22 20   Height:      Weight:   93.1 kg (205 lb 4 oz)   SpO2: 88% 90% 94% 93%   Weight change: 1 kg (2 lb 3.3 oz)  Intake/Output Summary (Last 24 hours) at 07/24/11 1430 Last data filed at 07/24/11 0900  Gross per 24 hour  Intake    270 ml  Output    400 ml  Net   -130 ml    General: Alert, awake, oriented x3, in no acute distress. HEENT: No bruits, no goiter.  Heart: Regular rate and rhythm, without murmurs, rubs, gallops.  Lungs: Prolongued expiratory phase, no wheeze Abdomen: Soft, nontender, nondistended, positive bowel sounds.  Neuro: Grossly intact, nonfocal.   Lab Results:  Basename 07/24/11 0645 07/22/11 0500  NA 130* 132*  K 3.9 4.3  CL 89* 91*  CO2 38* 31  GLUCOSE 125* 124*  BUN 21 19  CREATININE 0.74 0.64  CALCIUM 9.1 8.9  MG 2.0 --  PHOS -- --   No results found for this basename: AST:2,ALT:2,ALKPHOS:2,BILITOT:2,PROT:2,ALBUMIN:2 in the last 72 hours No results found for this basename: LIPASE:2,AMYLASE:2 in the last 72 hours  Basename 07/24/11 0645 07/22/11 0500  WBC 10.3 13.5*  NEUTROABS -- --  HGB 15.3 14.8  HCT 46.3 44.9  MCV 88.4 89.4  PLT 197 248    Basename 07/24/11 0645 07/23/11 2330 07/21/11 1520  CKTOTAL 129 146 187  CKMB 9.3* 10.1* 8.0*  CKMBINDEX -- -- --  TROPONINI <0.30 <0.30 <0.30   No components found with this basename: POCBNP:3 No results found for this basename: DDIMER:2 in the last 72 hours  Basename 07/22/11 0500  HGBA1C 6.1*   No results found for this basename: CHOL:2,HDL:2,LDLCALC:2,TRIG:2,CHOLHDL:2,LDLDIRECT:2 in the last 72 hours  Basename 07/24/11 0645  TSH 0.212*  T4TOTAL --  T3FREE --  THYROIDAB  --   No results found for this basename: VITAMINB12:2,FOLATE:2,FERRITIN:2,TIBC:2,IRON:2,RETICCTPCT:2 in the last 72 hours  Micro Results: Recent Results (from the past 240 hour(s))  CULTURE, BLOOD (ROUTINE X 2)     Status: Normal (Preliminary result)   Collection Time   07/20/11  5:44 PM      Component Value Range Status Comment   Specimen Description BLOOD   Final    Special Requests BOTTLES DRAWN AEROBIC AND ANAEROBIC   Final    Setup Time [id number]   Final    Culture     Final    Value:        BLOOD CULTURE RECEIVED NO GROWTH TO DATE CULTURE WILL BE HELD FOR 5 DAYS BEFORE ISSUING A FINAL NEGATIVE REPORT   Report Status PENDING   Incomplete   CULTURE, BLOOD (ROUTINE X 2)     Status: Normal (Preliminary result)   Collection Time   07/20/11  6:00 PM      Component Value Range Status Comment   Specimen Description BLOOD   Final    Special Requests BOTTLES DRAWN AEROBIC AND ANAEROBIC   Final    Setup Time [id number]   Final    Culture     Final    Value:  BLOOD CULTURE RECEIVED NO GROWTH TO DATE CULTURE WILL BE HELD FOR 5 DAYS BEFORE ISSUING A FINAL NEGATIVE REPORT   Report Status PENDING   Incomplete   CULTURE, SPUTUM-ASSESSMENT     Status: Normal   Collection Time   07/22/11  7:26 PM      Component Value Range Status Comment   Specimen Description SPUTUM   Final    Special Requests Normal   Final    Sputum evaluation     Final    Value: THIS SPECIMEN IS ACCEPTABLE. RESPIRATORY CULTURE REPORT TO FOLLOW.   Report Status 07/22/2011 FINAL   Final   CULTURE, RESPIRATORY     Status: Normal (Preliminary result)   Collection Time   07/22/11  7:26 PM      Component Value Range Status Comment   Specimen Description SPUTUM   Final    Special Requests NONE   Final    Gram Stain     Final    Value: MODERATE WBC PRESENT, PREDOMINANTLY PMN     FEW SQUAMOUS EPITHELIAL CELLS PRESENT     FEW GRAM NEGATIVE RODS     MODERATE YEAST     FEW GRAM POSITIVE COCCI IN PAIRS   Culture  Culture reincubated for better growth   Final    Report Status PENDING   Incomplete     Studies/Results: No results found.  Medications: I have reviewed the patient's current medications.   Patient Active Hospital Problem List:  COPD with acute exacerbation (07/20/2011)  1) Will plan on f/u with pulm's recommendations.  Pt improving clinically and Supplemental Oxygen near baseline of 2 L.   Would appreciate input regarding disposition from pulm.  DM type 2 (diabetes mellitus, type 2) (07/20/2011) Blood sugars have ranged from 124-131 and are currently well controlled on metformin  Hypertension (07/20/2011)   Stable last BP 129/78 will continue to monitor.  Pt on lisinopril and hctz.      LOS: 4 days   Penny Pia M.D.  [facility] 07/24/2011, 2:30 PM

## 2011-07-24 NOTE — Plan of Care (Signed)
Problem: Phase II Progression Outcomes Goal: Pain controlled on oral analgesia Outcome: Not Applicable Date Met:  07/24/11 No C/O pain

## 2011-07-24 NOTE — Progress Notes (Signed)
Subjective: Stable overnight, dyspnea much better Objective: Vital signs in last 24 hours: Blood pressure 129/78, pulse 91, temperature 97.6 F (36.4 C), temperature source Oral, resp. rate 20, height 5\' 10"  (1.778 m), weight 93.1 kg (205 lb 4 oz), SpO2 88.00%.  Intake/Output from previous day: 12/09 0701 - 12/10 0700 In: 750 [P.O.:600; IV Piggyback:150] Out: 400 [Urine:400]   Physical Exam:   ow male in nad Chest with a few scattered crackles, no wheezing, decreased bs. Cor with rrr Alert, oriented, moves all 4.    Lab Results:  Basename 07/24/11 0645 07/22/11 0500  WBC 10.3 13.5*  HGB 15.3 14.8  HCT 46.3 44.9  PLT 197 248   BMET  Basename 07/24/11 0645 07/22/11 0500  NA 130* 132*  K 3.9 4.3  CL 89* 91*  CO2 38* 31  GLUCOSE 125* 124*  BUN 21 19  CREATININE 0.74 0.64  CALCIUM 9.1 8.9    Studies/Results: No results found.  Assessment/Plan: Patient Active Hospital Problem List:  COPD (chronic obstructive pulmonary disease) (07/20/2011)   Assessment: the pt is a little better, and no excessive wob currently.  His ct chest does not show ISLD, but does show scarring from prior xrt, and some nodular densities in LL that need to be followed.    Plan: change to po steroids and levaquin prob can d/c to home 12/11 AM pulm wise      Shan Levans PCCM Service   Beeper  [phone]  Cell  [phone] . 07/24/2011, 4:12 PM

## 2011-07-24 NOTE — Progress Notes (Signed)
CRITICAL VALUE ALERT  Critical value received:  CK MB 10.1  Date of notification:  07/24/11  Time of notification:  0030  Critical value read back:yes  Nurse who received alert:  Orlando Penner  MD notified (1st page):  Tom Callahan-NP  Time of first page:  0035  MD notified (2nd page):  Time of second page:  Responding MD:  Laurann Montana   Time MD responded:  [phone]

## 2011-07-25 ENCOUNTER — Encounter (HOSPITAL_COMMUNITY): Payer: Self-pay

## 2011-07-25 DIAGNOSIS — J441 Chronic obstructive pulmonary disease with (acute) exacerbation: Secondary | ICD-10-CM

## 2011-07-25 DIAGNOSIS — J449 Chronic obstructive pulmonary disease, unspecified: Secondary | ICD-10-CM

## 2011-07-25 LAB — GLUCOSE, CAPILLARY
Glucose-Capillary: 76 mg/dL (ref 70–99)
Glucose-Capillary: 115 mg/dL — ABNORMAL HIGH (ref 70–99)
Glucose-Capillary: 107 mg/dL — ABNORMAL HIGH (ref 70–99)

## 2011-07-25 LAB — CULTURE, RESPIRATORY W GRAM STAIN: Culture: NORMAL

## 2011-07-25 MED ORDER — PREDNISONE (PAK) 10 MG PO TABS: 10.0000 mg | ORAL_TABLET | Freq: Every day | ORAL | Status: AC

## 2011-07-25 MED ORDER — LEVOFLOXACIN 750 MG PO TABS: 750.0000 mg | ORAL_TABLET | ORAL | Status: AC

## 2011-07-25 NOTE — Progress Notes (Signed)
Admit date: 07/20/2011 Discharge date: 07/25/2011  Primary Care Physician:  Ginette Otto, MD, MD   Discharge Diagnoses:   Active Hospital Problems  Diagnoses Date Noted   . COPD with acute exacerbation 07/20/2011   . SOB (shortness of breath) 07/20/2011   . COPD (chronic obstructive pulmonary disease) 07/20/2011   . DM type 2 (diabetes mellitus, type 2) 07/20/2011   . Hypertension 07/20/2011   . Cough 07/20/2011     Resolved Hospital Problems  Diagnoses Date Noted Date Resolved  . Acute respiratory failure 07/20/2011 07/24/2011  . Hypoxemia 07/20/2011 07/24/2011     DISCHARGE MEDICATION: Current Discharge Medication List    CONTINUE these medications which have NOT CHANGED   Details  aspirin 325 MG tablet Take 325 mg by mouth daily.      aspirin 81 MG chewable tablet Chew 81 mg by mouth daily.      azithromycin (ZITHROMAX) 250 MG tablet Take 250 mg by mouth daily.      budesonide-formoterol (SYMBICORT) 160-4.5 MCG/ACT inhaler Inhale 2 puffs into the lungs 2 (two) times daily.      cholecalciferol (VITAMIN D) 1000 UNITS tablet Take 1,000 Units by mouth daily. Take two tabets     guaiFENesin (MUCINEX) 600 MG 12 hr tablet Take 1,200 mg by mouth 2 (two) times daily.      lisinopril-hydrochlorothiazide (PRINZIDE,ZESTORETIC) 20-12.5 MG per tablet Take 1 tablet by mouth daily.      metFORMIN (GLUCOPHAGE) 500 MG tablet Take 500 mg by mouth 2 (two) times daily with a meal.      tiotropium (SPIRIVA) 18 MCG inhalation capsule Place 18 mcg into inhaler and inhale daily.      rosuvastatin (CRESTOR) 5 MG tablet Take 5 mg by mouth daily.             Consults: Treatment Team:  Max Fickle, MD   SIGNIFICANT DIAGNOSTIC STUDIES:  Dg Chest 2 View  07/21/2011  *RADIOLOGY REPORT*  Clinical Data: Status post diuresis, evaluate for interval change in interstitial opacities  CHEST - 2 VIEW  Comparison: 07/20/2011  Findings: Chronic interstitial markings/fibrotic changes.   Bullous changes in the right upper lobe.  No findings to suggest interstitial edema.  No pleural effusion or pneumothorax.  Cardiomediastinal silhouette is within normal limits. Postsurgical changes related to prior CABG.  Degenerative changes of the visualized thoracolumbar spine.  Cholecystectomy clips.  IMPRESSION: Chronic interstitial markings/fibrosis.  No findings to suggest interstitial edema.  Original Report Authenticated By: Charline Bills, M.D.   Dg Chest 2 View  07/20/2011  *RADIOLOGY REPORT*  Clinical Data: Shortness of breath, diabetes, cough, history of lung cancer  CHEST - 2 VIEW  Comparison: None.  Findings: There is mild cardiomegaly.  The mediastinum and pulmonary vasculature are within normal limits.  Chronic interstitial fibrotic disease is present, and there is an emphysematous bleb in the right upper lobe.  There are no focal infiltrates or effusions.  There is evidence of prior sternotomy and CABG.  IMPRESSION: Chronic interstitial fibrotic disease and right upper lung emphysematous bleb.  Original Report Authenticated By: Brandon Melnick, M.D.   Ct Chest Wo Contrast  07/22/2011  *RADIOLOGY REPORT*  Clinical Data: Pulmonary fibrosis.  CT CHEST WITHOUT CONTRAST  Technique:  Multidetector CT imaging of the chest was performed following the standard protocol without IV contrast.  Comparison: Chest x-ray 07/21/2011  Findings: There are several nodular areas within the lungs.  Plate- like densities seen in the right upper lobe which most likely reflects scarring but has  a mild nodularity near the superior right hilum.  Probable scarring also seen in the right middle lobe. There is a nodular subpleural area in the posterior right lower lobe measuring 10 mm.  Areas of nodularity in the posterior left lower lobe, the largest measuring up to 18 mm in diameter.  Otherwise no significant interstitial lung disease.  No areas of fibrosis or honeycombing otherwise.  No pleural effusions.  Prior  CABG.  No mediastinal, hilar or axillary adenopathy.  Heart is upper limits normal in size.  Aorta is normal caliber. Visualized thyroid and chest wall soft tissues unremarkable. Imaging into the upper abdomen shows no acute findings.  Prior cholecystectomy.  Low density area in the superior pole of the left kidney likely reflects a small cyst cannot be characterized without IV contrast.  IMPRESSION: Probable scarring in the right upper lobe and right middle lobe. The right upper lobe however does contain some mild nodularity and warrants followup.  There also bilateral lower lobe nodular areas which are nonspecific and warrant followup.  If clinically indicated, these areas could be followed with repeat CT in 6 months.  No evidence of significant interstitial lung disease or fibrosis.  Prior CABG.  Original Report Authenticated By: Cyndie Chime, M.D.     ECHO: Ef 60 % Mild LVH.  Please see full report in chart    CARDIAC CATH & OTHER PROCEDURES: N/A    Recent Results (from the past 240 hour(s))  CULTURE, BLOOD (ROUTINE X 2)     Status: Normal (Preliminary result)   Collection Time   07/20/11  5:44 PM      Component Value Range Status Comment   Specimen Description BLOOD   Final    Special Requests BOTTLES DRAWN AEROBIC AND ANAEROBIC   Final    Setup Time [id number]   Final    Culture     Final    Value:        BLOOD CULTURE RECEIVED NO GROWTH TO DATE CULTURE WILL BE HELD FOR 5 DAYS BEFORE ISSUING A FINAL NEGATIVE REPORT   Report Status PENDING   Incomplete   CULTURE, BLOOD (ROUTINE X 2)     Status: Normal (Preliminary result)   Collection Time   07/20/11  6:00 PM      Component Value Range Status Comment   Specimen Description BLOOD   Final    Special Requests BOTTLES DRAWN AEROBIC AND ANAEROBIC   Final    Setup Time [id number]   Final    Culture     Final    Value:        BLOOD CULTURE RECEIVED NO GROWTH TO DATE CULTURE WILL BE HELD FOR 5 DAYS BEFORE ISSUING A FINAL NEGATIVE REPORT     Report Status PENDING   Incomplete   CULTURE, SPUTUM-ASSESSMENT     Status: Normal   Collection Time   07/22/11  7:26 PM      Component Value Range Status Comment   Specimen Description SPUTUM   Final    Special Requests Normal   Final    Sputum evaluation     Final    Value: THIS SPECIMEN IS ACCEPTABLE. RESPIRATORY CULTURE REPORT TO FOLLOW.   Report Status 07/22/2011 FINAL   Final   CULTURE, RESPIRATORY     Status: Normal   Collection Time   07/22/11  7:26 PM      Component Value Range Status Comment   Specimen Description SPUTUM   Final    Special Requests  NONE   Final    Gram Stain     Final    Value: MODERATE WBC PRESENT, PREDOMINANTLY PMN     FEW SQUAMOUS EPITHELIAL CELLS PRESENT     FEW GRAM NEGATIVE RODS     MODERATE YEAST     FEW GRAM POSITIVE COCCI IN PAIRS   Culture NORMAL OROPHARYNGEAL FLORA   Final    Report Status 07/25/2011 FINAL   Final     BRIEF ADMITTING H & P: Pt is an 77y/o w h/o copd oxygen dependend on 2L [place] at home that presented to the ED with progressive increase in SOB.  In the ED was evaluated and was admitted with a dx of COPD exacerbation.  Was seen by pulmonology and was placed on supplemental O2, antibiotics, steroids, albuterol, spiriva and symbicort.  Pt improved on this regimen.  Plan will we to f/u with pulmonology in 1-2 weeks.  Active Hospital Problems  Diagnoses Date Noted   . COPD with acute exacerbation 07/20/2011   . SOB (shortness of breath) 07/20/2011   . COPD (chronic obstructive pulmonary disease) 07/20/2011   . DM type 2 (diabetes mellitus, type 2) 07/20/2011   . Hypertension 07/20/2011   . Cough 07/20/2011     Resolved Hospital Problems  Diagnoses Date Noted Date Resolved  . Acute respiratory failure 07/20/2011 07/24/2011  . Hypoxemia 07/20/2011 07/24/2011     Disposition and Follow-up: Pt is to f/u with his pulmonologist Shan Levans or his PCP) in one to two weeks.  Discharge Orders    Future Appointments: Provider:  Department: Dept Phone: Center:   08/02/2011 10:00 AM Rubye Oaks, NP [facility] [phone] None     Follow-up Information    Follow up with [facility], NP on 08/02/2011. (to see Tammy Parrett @ 10am)    Contact information:   [facility] [address] [place] [address] [phone]       Follow up with [facility] CARE. (HHRN/PT;RESUME HOME 02)    Contact information:   [address] [place] [address] [phone]          DISCHARGE EXAM:  General elderly male lying in bed in mild SOB,  2. Normal affect and insight, Not Suicidal or Homicidal, Awake Alert, Oriented *3.  3. No F.N deficits, ALL C.Nerves Intact, Strength 5/5 all 4 extremities, Sensation intact all 4 extremities, Plantars down going.  4. Ears and Eyes appear Normal, Conjunctivae clear, PERRLA. Moist Oral Mucosa.  5. Supple Neck, No JVD, No cervical lymphadenopathy appriciated, No Carotid Bruits.  6. Symmetrical Chest wall movement, Mod air movement bilaterally, No wheezes or Rhales  7. RRR, No Gallops, Rubs or Murmurs, No Parasternal Heave.  8. Positive Bowel Sounds, Abdomen Soft, Non tender, No organomegaly appriciated,  No rebound -guarding or rigidity.  9. No Cyanosis, Normal Skin Turgor, No Skin Rash or Bruise.  10. Good muscle tone, joints appear normal , no effusions, Normal ROM.  11. No Palpable Lymph Nodes in Neck or Axillae   Blood pressure 125/72, pulse 73, temperature 97.3 F (36.3 C), temperature source Oral, resp. rate 20, height 5\' 10"  (1.778 m), weight 93 kg (205 lb 0.4 oz), SpO2 91.00%.   Basename 07/24/11 0645  NA 130*  K 3.9  CL 89*  CO2 38*  GLUCOSE 125*  BUN 21  CREATININE 0.74  CALCIUM 9.1  MG 2.0  PHOS --   No results found for this basename: AST:2,ALT:2,ALKPHOS:2,BILITOT:2,PROT:2,ALBUMIN:2 in the last 72 hours No  results found for this basename: LIPASE:2,AMYLASE:2 in the last 72 hours  Basename 07/24/11  0645  WBC 10.3  NEUTROABS --  HGB 15.3  HCT 46.3  MCV 88.4  PLT 197    Signed: Penny Pia M.D. 07/25/2011, 4:56 PM

## 2011-07-25 NOTE — Progress Notes (Signed)
Subjective: No acute issues overnight.  Pt feels much better today.  Denies any fever or chills. Objective: Filed Vitals:   07/25/11 1610 07/25/11 0829 07/25/11 0925 07/25/11 1313  BP: 134/84   125/72  Pulse: 69  73   Temp: 97.3 F (36.3 C)     TempSrc: Oral     Resp: 20     Height:      Weight: 93 kg (205 lb 0.4 oz)     SpO2: 94% 94% 91%    Weight change: -0.1 kg (-3.5 oz)  Intake/Output Summary (Last 24 hours) at 07/25/11 1641 Last data filed at 07/25/11 0900  Gross per 24 hour  Intake    360 ml  Output    675 ml  Net   -315 ml    General: Alert, awake, oriented x3, in no acute distress. HEENT: No bruits, no goiter.  Heart: Regular rate and rhythm, without murmurs, rubs, gallops.  Lungs: Clear to auscultation.  Prolonged exp phase  Abdomen: Soft, nontender, nondistended, positive bowel sounds.  Neuro: Grossly intact, nonfocal. Extremities:  No edema  Lab Results:  Basename 07/24/11 0645  NA 130*  K 3.9  CL 89*  CO2 38*  GLUCOSE 125*  BUN 21  CREATININE 0.74  CALCIUM 9.1  MG 2.0  PHOS --   No results found for this basename: AST:2,ALT:2,ALKPHOS:2,BILITOT:2,PROT:2,ALBUMIN:2 in the last 72 hours No results found for this basename: LIPASE:2,AMYLASE:2 in the last 72 hours  Basename 07/24/11 0645  WBC 10.3  NEUTROABS --  HGB 15.3  HCT 46.3  MCV 88.4  PLT 197    Basename 07/24/11 1517 07/24/11 0645 07/23/11 2330  CKTOTAL 141 129 146  CKMB 9.4* 9.3* 10.1*  CKMBINDEX -- -- --  TROPONINI <0.30 <0.30 <0.30   No components found with this basename: POCBNP:3 No results found for this basename: DDIMER:2 in the last 72 hours No results found for this basename: HGBA1C:2 in the last 72 hours No results found for this basename: CHOL:2,HDL:2,LDLCALC:2,TRIG:2,CHOLHDL:2,LDLDIRECT:2 in the last 72 hours  Basename 07/24/11 0645  TSH 0.212*  T4TOTAL --  T3FREE --  THYROIDAB --   No results found for this basename:  VITAMINB12:2,FOLATE:2,FERRITIN:2,TIBC:2,IRON:2,RETICCTPCT:2 in the last 72 hours  Micro Results: Recent Results (from the past 240 hour(s))  CULTURE, BLOOD (ROUTINE X 2)     Status: Normal (Preliminary result)   Collection Time   07/20/11  5:44 PM      Component Value Range Status Comment   Specimen Description BLOOD   Final    Special Requests BOTTLES DRAWN AEROBIC AND ANAEROBIC   Final    Setup Time [id number]   Final    Culture     Final    Value:        BLOOD CULTURE RECEIVED NO GROWTH TO DATE CULTURE WILL BE HELD FOR 5 DAYS BEFORE ISSUING A FINAL NEGATIVE REPORT   Report Status PENDING   Incomplete   CULTURE, BLOOD (ROUTINE X 2)     Status: Normal (Preliminary result)   Collection Time   07/20/11  6:00 PM      Component Value Range Status Comment   Specimen Description BLOOD   Final    Special Requests BOTTLES DRAWN AEROBIC AND ANAEROBIC   Final    Setup Time [id number]   Final    Culture     Final    Value:        BLOOD CULTURE RECEIVED NO GROWTH TO DATE CULTURE WILL BE HELD FOR 5 DAYS BEFORE  ISSUING A FINAL NEGATIVE REPORT   Report Status PENDING   Incomplete   CULTURE, SPUTUM-ASSESSMENT     Status: Normal   Collection Time   07/22/11  7:26 PM      Component Value Range Status Comment   Specimen Description SPUTUM   Final    Special Requests Normal   Final    Sputum evaluation     Final    Value: THIS SPECIMEN IS ACCEPTABLE. RESPIRATORY CULTURE REPORT TO FOLLOW.   Report Status 07/22/2011 FINAL   Final   CULTURE, RESPIRATORY     Status: Normal   Collection Time   07/22/11  7:26 PM      Component Value Range Status Comment   Specimen Description SPUTUM   Final    Special Requests NONE   Final    Gram Stain     Final    Value: MODERATE WBC PRESENT, PREDOMINANTLY PMN     FEW SQUAMOUS EPITHELIAL CELLS PRESENT     FEW GRAM NEGATIVE RODS     MODERATE YEAST     FEW GRAM POSITIVE COCCI IN PAIRS   Culture NORMAL OROPHARYNGEAL FLORA   Final    Report Status 07/25/2011 FINAL    Final     Studies/Results: No results found.  Medications: I have reviewed the patient's current medications.   Patient Active Hospital Problem List: COPD with acute exacerbation (07/20/2011)  Improved.  Appreciate pulm's input.  Will plan on D/cing today on spiriva, pred taper, symbicort and abx  SOB (shortness of breath) (07/20/2011)  Patient is back to baseline  DM type 2 (diabetes mellitus, type 2) (07/20/2011) Stable and well controlled on current drug regimen  Hypertension (07/20/2011) Stable  Cough (07/20/2011)   Secondary to # 1 most likely. Currently improved.     LOS: 5 days   Penny Pia M.D.  [facility] 07/25/2011, 4:41 PM

## 2011-07-25 NOTE — Progress Notes (Signed)
Physical Therapy Evaluation Patient Details Name: Darrell Livingston MRN: [id number] DOB: 1921-07-17 Today's Date: 07/25/2011 Time: 7829-5621  Eval II Problem List:  Patient Active Problem List  Diagnoses  . SOB (shortness of breath)  . COPD (chronic obstructive pulmonary disease)  . DM type 2 (diabetes mellitus, type 2)  . Hypertension  . COPD with acute exacerbation  . Cough    Past Medical History:  Past Medical History  Diagnosis Date  . COPD (chronic obstructive pulmonary disease)   . Lung cancer   . Cardiac disorder   . Coronary artery disease   . Hypertension   . Diabetes mellitus   . COPD (chronic obstructive pulmonary disease) 07/20/2011  . DM type 2 (diabetes mellitus, type 2) 07/20/2011  . Hypertension 07/20/2011   Past Surgical History:  Past Surgical History  Procedure Date  . Cardiac surgery   . Cholecystectomy   . Coronary angioplasty with stent placement   . Eye surgery     PT Assessment/Plan/Recommendation PT Assessment Clinical Impression Statement: Pt presents with diagnosis of COPD with acute exacerbation, general weakness. Pt wil benefit from skilled PT in the acute care setting to improve general strength, activity tolerance in order to maximize independence in preperation for D/C home PT Recommendation/Assessment: Patient will need skilled PT in the acute care venue PT Problem List: Decreased strength;Decreased activity tolerance;Decreased knowledge of use of DME PT Therapy Diagnosis : Difficulty walking;Generalized weakness PT Plan PT Frequency: Min 3X/week PT Treatment/Interventions: DME instruction;Gait training;Stair training;Functional mobility training;Therapeutic activities;Therapeutic exercise;Patient/family education PT Recommendation Recommendations for Other Services: OT consult Follow Up Recommendations: Home health PT Equipment Recommended:  (Pt states he has RW available at home) PT Goals  Acute Rehab PT Goals PT Goal Formulation:  With patient Pt will go Sit to Stand: with modified independence Pt will go Stand to Sit: with modified independence Pt will Ambulate: with modified independence;51 - 150 feet;with least restrictive assistive device Pt will Go Up / Down Stairs: 1-2 stairs;with rail(s)  PT Evaluation Precautions/Restrictions    Prior Functioning  Home Living Lives With: Spouse;Son (wife has 24 hour caregiver) Receives Help From: Family Type of Home: House Home Layout: Two level;Able to live on main level with bedroom/bathroom Home Access: Stairs to enter Entrance Stairs-Rails: None Entrance Stairs-Number of Steps: 2 Additional Comments: Pt states he has RW available and wife has shower chair that he can use Prior Function Level of Independence: Independent with gait;Independent with transfers;Independent with basic ADLs Cognition Cognition Arousal/Alertness: Awake/alert Overall Cognitive Status: Appears within functional limits for tasks assessed Sensation/Coordination Coordination Gross Motor Movements are Fluid and Coordinated: Yes Extremity Assessment RLE Assessment RLE Assessment:  (Strength > or = 4/5 with functional activity) LLE Assessment LLE Assessment: Not tested (Strength > or = 4/5 with functional activity) Mobility (including Balance) Bed Mobility Bed Mobility: Yes Supine to Sit: 6: Modified independent (Device/Increase time) Sit to Supine - Left: 6: Modified independent (Device/Increase time) Transfers Transfers: Yes Sit to Stand: 5: Supervision Sit to Stand Details (indicate cue type and reason): VCs saftey, technique, hand placement Stand to Sit: 5: Supervision Stand to Sit Details: VCs safety, technique, hand placement Ambulation/Gait Ambulation/Gait: Yes Ambulation/Gait Assistance Details (indicate cue type and reason): Min-guard assist. VCs safety/safe use of RW. Pt fatigues fairly easily. 1 standing rest break to assess O2 sats - dropped to 86% on 3LO2.  Ambulation  Distance (Feet): 150 Feet Assistive device: Rolling walker Gait Pattern: Step-through pattern;Trunk flexed  Posture/Postural Control Posture/Postural Control: No significant limitations Balance Balance  Assessed: No (No LOB noted with ambulation) Exercise    End of Session PT - End of Session Equipment Utilized During Treatment: Gait belt Activity Tolerance: Patient limited by fatigue Patient left: in bed;with call bell in reach (Pt requested back to bed) General Behavior During Session: [facility] for tasks performed Cognition: [facility] for tasks performed  Rebeca Alert [facility] 07/25/2011, 10:04 AM

## 2011-07-25 NOTE — Progress Notes (Signed)
Subjective: Stable overnight, dyspnea much better, he wants to go home. Objective: Vital signs in last 24 hours: Blood pressure 134/84, pulse 73, temperature 97.3 F (36.3 C), temperature source Oral, resp. rate 20, height 5\' 10"  (1.778 m), weight 93 kg (205 lb 0.4 oz), SpO2 91.00%.  Intake/Output from previous day: 12/10 0701 - 12/11 0700 In: 480 [P.O.:480] Out: 250 [Urine:250]   Physical Exam:   ow male in nad Chest with a few scattered crackles, no wheezing, decreased bs. Cor with rrr Alert, oriented, moves all 4.    Lab Results:  [facility] 07/24/11 0645  WBC 10.3  HGB 15.3  HCT 46.3  PLT 197   BMET  Basename 07/24/11 0645  NA 130*  K 3.9  CL 89*  CO2 38*  GLUCOSE 125*  BUN 21  CREATININE 0.74  CALCIUM 9.1    Studies/Results: No results found.  Assessment/Plan: Patient Active Hospital Problem List:  COPD (chronic obstructive pulmonary disease) (07/20/2011)   Assessment: the pt is a little better, and no excessive wob currently.  His ct chest does not show ISLD, but does show scarring from prior xrt, and some nodular densities in LL that need to be followed.    Plan: change to po steroids and levaquin OK to d/c to home today  Send out on Spiriva and symbicort daily , pred taper, levaquin. Home PT/OT.  He already has oxygen.  He will need f/u with pulmonary.  I will ask the NP to arrange f/u OV soon with our outpt NP for post hospital f/u. [facility]   Beeper  [phone]  Cell  [phone] . 07/25/2011, 11:08 AM

## 2011-07-26 LAB — CULTURE, BLOOD (ROUTINE X 2)
Culture  Setup Time: 201212062217
Culture: NO GROWTH

## 2011-07-27 ENCOUNTER — Encounter (HOSPITAL_COMMUNITY): Payer: Self-pay

## 2011-08-01 ENCOUNTER — Encounter (HOSPITAL_COMMUNITY): Payer: Self-pay

## 2011-08-02 ENCOUNTER — Inpatient Hospital Stay: Payer: Medicare Other | Admitting: Adult Health

## 2011-08-03 ENCOUNTER — Encounter (HOSPITAL_COMMUNITY): Payer: Self-pay

## 2011-08-08 ENCOUNTER — Encounter (HOSPITAL_COMMUNITY): Payer: Self-pay

## 2011-08-10 ENCOUNTER — Encounter (HOSPITAL_COMMUNITY): Payer: Self-pay

## 2011-08-15 ENCOUNTER — Encounter (HOSPITAL_COMMUNITY): Payer: Self-pay

## 2011-08-17 ENCOUNTER — Encounter (HOSPITAL_COMMUNITY): Payer: Self-pay

## 2011-08-22 ENCOUNTER — Encounter (HOSPITAL_COMMUNITY): Payer: Self-pay

## 2011-08-24 ENCOUNTER — Encounter (HOSPITAL_COMMUNITY): Payer: Self-pay

## 2015-03-12 ENCOUNTER — Ambulatory Visit (INDEPENDENT_AMBULATORY_CARE_PROVIDER_SITE_OTHER): Payer: Medicare Other | Admitting: Podiatry

## 2015-03-12 ENCOUNTER — Ambulatory Visit: Payer: Self-pay | Admitting: Podiatry

## 2015-03-12 ENCOUNTER — Encounter: Payer: Self-pay | Admitting: Podiatry

## 2015-03-12 VITALS — BP 126/66 | HR 87 | Resp 18

## 2015-03-12 DIAGNOSIS — Q828 Other specified congenital malformations of skin: Secondary | ICD-10-CM

## 2015-03-12 DIAGNOSIS — M79676 Pain in unspecified toe(s): Secondary | ICD-10-CM

## 2015-03-12 DIAGNOSIS — B351 Tinea unguium: Secondary | ICD-10-CM

## 2015-03-12 NOTE — Progress Notes (Signed)
Subjective:     Patient ID: Darrell Livingston, male   DOB: [birth year], 81 y.o.   MRN: [id number]  HPIThis patient presents to the office for preventive foot care services.  He says his nails are thick and long and painful when he walks.  He also has corns on his toes in addition to callus on his forefoot both feet.  He has difficulty walking and needs to be treated with routine foot care. He is diabetic.   Review of Systems     Objective:   Physical Exam GENERAL APPEARANCE: Alert, conversant. Appropriately groomed. No acute distress.  VASCULAR: Pedal pulses not palpable at 2/4 due to swelling  bilateral.  Capillary refill time is immediate to all digits,   NEUROLOGIC: sensation is intact epicritically and protectively to 5.07 monofilament at 5/5 sites bilateral.  Light touch is intact bilateral, vibratory sensation intact bilateral, achilles tendon reflex is intact bilateral.  MUSCULOSKELETAL: acceptable muscle strength, tone and stability bilateral.  Intrinsic muscluature intact bilateral.  Rectus appearance of foot and digits noted bilateral.   DERMATOLOGIC: skin color, texture, and turgor are within normal limits.  No preulcerative lesions or ulcers  are seen, no interdigital maceration noted.  No open lesions present.  . No drainage noted. Porokeratosis three under right foot.   Severe porokeratosis sub3 left foot.  Corns noted fourth and fifth toe left foot. NAILS  Thick disfigured discolored nails with no paronychia noted.     Assessment:     Onychomycosis   Porokeratosis B/L     Plan:     IE  Debridement and Grinding of Nails B/L.  Debridement of porokeratosis.  RTC 3 months

## 2015-03-24 ENCOUNTER — Encounter: Payer: Self-pay | Admitting: Radiation Oncology

## 2015-03-24 NOTE — Progress Notes (Signed)
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

## 2015-03-24 NOTE — Progress Notes (Signed)
Thoracic Location of Tumor / Histology: lung ca  Patient presented with symptoms of: relapse found on surveillance CT  Biopsies revealed: CT-guided biopsy from right upper lobe 08/20/2008: squamous cell carcinoma. CT-guided biopsy from right middle lobe 12/29/2008: non small cell carcinoma, favor squamous cell carcinoma.  Tobacco/Marijuana/Snuff/ETOH use: former smoker quit in 1998  Past/Anticipated interventions by cardiothoracic surgery, if any: no  Past/Anticipated interventions by medical oncology, if any: surveillance  Signs/Symptoms  Weight changes, if any: No  Respiratory complaints, if any: dyspnea stable  Hemoptysis, if any: no  Pain issues, if any:  no  SAFETY ISSUES:  Prior radiation? Yes; stereotactic body radiotherapy completed in 10/2008 to the right upper lobe; then stereotactic body radiotherapy completed in 02/2009 to the right middle lobe at [facility] in 2010  Pacemaker/ICD? no   Possible current pregnancy?no  Is the patient on methotrexate? no  Current Complaints / other details:  81 year old male. Widowed. Now with relapse on CT: Enlargement of the small cavitated left upper lobe lesions. Stable posttreatment changes in the right upper and middle lobes.

## 2015-03-25 ENCOUNTER — Ambulatory Visit
Admission: RE | Admit: 2015-03-25 | Discharge: 2015-03-25 | Disposition: A | Discharge status: Home / Self Care | Payer: Medicare Other | Source: Ambulatory Visit | Attending: Radiation Oncology | Admitting: Radiation Oncology

## 2015-03-25 ENCOUNTER — Encounter: Payer: Self-pay | Admitting: Radiation Oncology

## 2015-03-25 VITALS — BP 137/64 | HR 96 | Resp 18 | Ht 69.0 in | Wt 210.0 lb

## 2015-03-25 DIAGNOSIS — J449 Chronic obstructive pulmonary disease, unspecified: Secondary | ICD-10-CM | POA: Insufficient documentation

## 2015-03-25 DIAGNOSIS — Z510 Encounter for antineoplastic radiation therapy: Secondary | ICD-10-CM | POA: Insufficient documentation

## 2015-03-25 DIAGNOSIS — I10 Essential (primary) hypertension: Secondary | ICD-10-CM | POA: Insufficient documentation

## 2015-03-25 DIAGNOSIS — Z87891 Personal history of nicotine dependence: Secondary | ICD-10-CM | POA: Insufficient documentation

## 2015-03-25 DIAGNOSIS — I2510 Atherosclerotic heart disease of native coronary artery without angina pectoris: Secondary | ICD-10-CM | POA: Diagnosis not present

## 2015-03-25 DIAGNOSIS — Z923 Personal history of irradiation: Secondary | ICD-10-CM | POA: Diagnosis not present

## 2015-03-25 DIAGNOSIS — D381 Neoplasm of uncertain behavior of trachea, bronchus and lung: Secondary | ICD-10-CM | POA: Diagnosis present

## 2015-03-25 DIAGNOSIS — Z85118 Personal history of other malignant neoplasm of bronchus and lung: Secondary | ICD-10-CM | POA: Insufficient documentation

## 2015-03-25 DIAGNOSIS — E119 Type 2 diabetes mellitus without complications: Secondary | ICD-10-CM | POA: Insufficient documentation

## 2015-03-25 DIAGNOSIS — C3412 Malignant neoplasm of upper lobe, left bronchus or lung: Secondary | ICD-10-CM

## 2015-03-25 NOTE — Progress Notes (Signed)
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 Neoplasm of uncertain behavior of trachea, bronchus, and lung 235.7 D38.1     HISTORY OF PRESENT ILLNESS::Jkwon E Rayner is a 81 y.o. male who was initially diagnosed with squamous cell carcinoma of the right upper lung in January 2010 by CT-guided biopsy. He underwent stereotactic body [facility] in March 2010 to 50 Gy in 5 fractions. He was subsequently diagnosed with a second right middle lung non-small cell carcinoma in May 2010 which was also treated with stereotactic body radiotherapy in July 2010 to 54 Gy in 3 fractions. The patient was in routine follow-up at the [facility] in [place]. Recent CT imaging on 03/01/2015 demonstrated 2 left upper lung findings. A 7 mm left cavitary upper lung nodule increased to 1.5 cm with a new solid 1.1 cm cavitating left upper lung lesion. These 2 lesions were felt to represent neoplasms the patient is currently been referred closer to home for potential management of synchronous stage I left upper lung cancers.  PREVIOUS RADIATION THERAPY: Yes SBRT completed in March 2010 to the right upper lobe. Total dose of 50 Gy in 5 fractions. SBRT completed in July 2010 to the right middle lobe. Total dose of 54 Gy in 3 fractions  PAST MEDICAL HISTORY:  has a past medical history of COPD (chronic obstructive pulmonary disease); Lung cancer; Cardiac disorder; Coronary artery disease; Hypertension; Diabetes mellitus; COPD (chronic obstructive pulmonary disease) (07/20/2011); DM type 2 (diabetes mellitus, type 2) (07/20/2011);  and Hypertension (07/20/2011).    PAST SURGICAL HISTORY: Past Surgical History  Procedure Laterality Date  . Cardiac surgery    . Cholecystectomy    . Coronary angioplasty with stent placement    . Eye surgery      FAMILY HISTORY: family history is negative for Cancer.  SOCIAL HISTORY:  Social History   Social History  . Marital Status: Married    Spouse Name: N/A  . Number of Children: N/A  . Years of Education: N/A   Occupational History  . Not on file.   Social History Main Topics  . Smoking status: Former Smoker -- 2.00 packs/day for 54 years    Types: Cigarettes    Quit date: 11/17/1996  . Smokeless tobacco: Never Used  . Alcohol Use: Yes     Comment: up to 2 drinks per day but not everyday per pt  . Drug Use: No  . Sexual Activity: No   Other Topics Concern  . Not on file   Social History Narrative    ALLERGIES: Review of patient's allergies indicates no known allergies.  MEDICATIONS:  Current Outpatient Prescriptions  Medication Sig Dispense Refill  . atorvastatin (LIPITOR) 20 MG tablet Take 20 mg by mouth daily.    . budesonide-formoterol (SYMBICORT) 160-4.5 MCG/ACT inhaler Inhale 2 puffs into the lungs 2 (two) times daily.      . cholecalciferol (VITAMIN D) 1000 UNITS tablet Take 1,000 Units by mouth daily. Take two tabets     . gabapentin (NEURONTIN) 300 MG capsule Take 300 mg by mouth 3 (three) times daily.    Marland Kitchen guaiFENesin (  MUCINEX) 600 MG 12 hr tablet Take 1,200 mg by mouth 2 (two) times daily.      Marland Kitchen latanoprost (XALATAN) 0.005 % ophthalmic solution 1 drop at bedtime.    Marland Kitchen lisinopril-hydrochlorothiazide (PRINZIDE,ZESTORETIC) 20-12.5 MG per tablet Take 1 tablet by mouth daily.      . metFORMIN (GLUCOPHAGE) 500 MG tablet Take 500 mg by mouth 2 (two) times daily with a meal.      . tiotropium (SPIRIVA) 18 MCG inhalation capsule Place 18 mcg into inhaler and inhale daily.       No current facility-administered medications for this encounter.     REVIEW OF SYSTEMS:  A 15 point review of systems is documented in the electronic medical record. This was obtained by the nursing staff. However, I reviewed this with the patient to discuss relevant findings and make appropriate changes.  Pertinent items are noted in HPI.   The pt reports mild left chest pain. Reports he fell a few days ago and hit his head. Patient reports he has neuropathy in his feet and his legs are weak; he relates this to why he fell. An bruise is noted left forehead. Continues to drive and live independently. Reports SOB with little exertion. Patient has hx of COPD.     PHYSICAL EXAM:  height is '5\' 9"'$  (1.753 m) and weight is 210 lb (95.255 kg). His blood pressure is 137/64 and his pulse is 96. His respiration is 18 and oxygen saturation is 95%.  Pt reports to the clinic in a wheelchair. No acute distress. Respiratory effort is unremarkable.  KPS = 90  100 - Normal; no complaints; no evidence of disease. 90   - Able to carry on normal activity; minor signs or symptoms of disease. 80   - Normal activity with effort; some signs or symptoms of disease. 15   - Cares for self; unable to carry on normal activity or to do active work. 60   - Requires occasional assistance, but is able to care for most of his personal needs. 50   - Requires considerable assistance and frequent medical care. 2   - Disabled; requires special care and assistance. 7   - Severely disabled; hospital admission is indicated although death not imminent. 28   - Very sick; hospital admission necessary; active supportive treatment necessary. 10   - Moribund; fatal processes progressing rapidly. 0     - Dead  Karnofsky DA, Abelmann [place], Craver LS and Burchenal [facility] [phone]) The use of the nitrogen mustards in the palliative treatment of carcinoma: with particular reference to bronchogenic carcinoma Cancer 1 634-56  LABORATORY DATA:  Lab Results  Component Value Date   WBC 10.3 07/24/2011   HGB 15.3  07/24/2011   HCT 46.3 07/24/2011   MCV 88.4 07/24/2011   PLT 197 07/24/2011   Lab Results  Component Value Date   NA 130* 07/24/2011   K 3.9 07/24/2011   CL 89* 07/24/2011   CO2 38* 07/24/2011   No results found for: ALT, AST, GGT, ALKPHOS, BILITOT   RADIOGRAPHY: No results found.    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 The pt may be eligible for SBRT. The question is whether any addition testing, such as a PET scan and biopsy, should be considered before treatment.  PLAN: Today, I talked to the patient and family about the findings and work-up thus far.  We discussed the natural history of early  stage lung cancer and general treatment, highlighting the role of stereotactic body radiotherapy in the management.  We discussed the available radiation techniques, and focused on the details of logistics and delivery.  We reviewed the anticipated acute and late sequelae associated with radiation in this setting.  The patient was encouraged to ask questions that I answered to the best of my ability. The patient would like to proceed with radiation and will be scheduled for CT simulation.  The pt will be in [place] next Tuesday through Saturday at a gathering of [employer], a group of [employer] who were stationed in [place] in the [place]. The pt will be back Monday, August 22. The pt would like his treatments at 10:30AM. The pt will f/u with me on August 25 at 10:30AM. I will contact the [facility] to get the pt's CT scan for review at [facility].  I spent 40 minutes minutes face to face with the patient and more than 50% of that time was spent in counseling and/or coordination of care.   This document serves as a record of services personally performed by Tyler Pita, MD. It was created on his behalf by Darcus Austin, a trained medical scribe. The creation of this record is based on the scribe's  personal observations and the provider's statements to them. This document has been checked and approved by the attending provider.     ------------------------------------------------  Sheral Apley Tammi Klippel, M.D.

## 2015-03-25 NOTE — Progress Notes (Signed)
See progress note under physician encounter. 

## 2015-03-25 NOTE — Progress Notes (Signed)
Former Company secretary. Former Engineer, maintenance (IT) until age 81. Reports mild left chest pain. Reports he fell a few days ago and hit his head. Patient reports he has neuropathy in his feet and his legs are weak; he relates this to why he fell. Old yellow bruise noted left forehead. Continues to drive and live independently. Reports SOB with little exertion. Patient has hx of COPD.   BP 137/64 mmHg  Pulse 96  Resp 18  Ht '5\' 9"'$  (1.753 m)  Wt 210 lb (95.255 kg)  BMI 31.00 kg/m2  SpO2 95% Wt Readings from Last 3 Encounters:  03/25/15 210 lb (95.255 kg)  07/25/11 205 lb 0.4 oz (93 kg)

## 2015-03-29 NOTE — Progress Notes (Signed)
Haven't seen this disc yet.  Darrell Livingston

## 2015-04-05 ENCOUNTER — Encounter: Payer: Self-pay | Admitting: Radiation Oncology

## 2015-04-05 NOTE — Progress Notes (Signed)
  Radiation Oncology         [phone] ________________________________  Name: Darrell Livingston MRN: [id number]  Date: 04/05/2015  DOB: 10-31-1920  Chart Note:  I reviewed this patient's CT images from the [place].  It does show two left upper lung nodules suspicious for malignancy.        Impression:  In light of this information, we will discuss at [place]:  At this point, I will forward the CD-Rom to Norton Blizzard for presentation at the next [facility] meeting.  We need to decide about next steps, which could include PET-CT, biopsy, possible SBRT with or without tissue, or ongoing surveillance.  ________________________________  Sheral Apley. Tammi Klippel, M.D.

## 2015-04-06 ENCOUNTER — Inpatient Hospital Stay
Admission: RE | Admit: 2015-04-06 | Discharge: 2015-04-06 | Disposition: A | Discharge status: Home / Self Care | Payer: Self-pay | Source: Ambulatory Visit | Attending: Radiation Oncology | Admitting: Radiation Oncology

## 2015-04-06 ENCOUNTER — Other Ambulatory Visit: Payer: Self-pay | Admitting: Radiation Oncology

## 2015-04-06 DIAGNOSIS — C3490 Malignant neoplasm of unspecified part of unspecified bronchus or lung: Secondary | ICD-10-CM

## 2015-04-08 ENCOUNTER — Encounter: Payer: Self-pay | Admitting: Radiation Oncology

## 2015-04-08 ENCOUNTER — Ambulatory Visit
Admission: RE | Admit: 2015-04-08 | Discharge: 2015-04-08 | Disposition: A | Discharge status: Home / Self Care | Payer: Medicare Other | Source: Ambulatory Visit | Attending: Radiation Oncology | Admitting: Radiation Oncology

## 2015-04-08 ENCOUNTER — Ambulatory Visit (HOSPITAL_COMMUNITY)
Admission: RE | Admit: 2015-04-08 | Discharge: 2015-04-08 | Disposition: A | Discharge status: Home / Self Care | Payer: Medicare Other | Source: Ambulatory Visit | Attending: Radiation Oncology | Admitting: Radiation Oncology

## 2015-04-08 VITALS — BP 116/60 | HR 100 | Temp 97.8°F | Resp 22 | Wt 211.1 lb

## 2015-04-08 DIAGNOSIS — Z510 Encounter for antineoplastic radiation therapy: Secondary | ICD-10-CM | POA: Diagnosis present

## 2015-04-08 DIAGNOSIS — J449 Chronic obstructive pulmonary disease, unspecified: Secondary | ICD-10-CM | POA: Diagnosis not present

## 2015-04-08 DIAGNOSIS — C3412 Malignant neoplasm of upper lobe, left bronchus or lung: Secondary | ICD-10-CM

## 2015-04-08 DIAGNOSIS — R0600 Dyspnea, unspecified: Secondary | ICD-10-CM

## 2015-04-08 DIAGNOSIS — I2510 Atherosclerotic heart disease of native coronary artery without angina pectoris: Secondary | ICD-10-CM | POA: Diagnosis not present

## 2015-04-08 DIAGNOSIS — R0602 Shortness of breath: Secondary | ICD-10-CM | POA: Insufficient documentation

## 2015-04-08 DIAGNOSIS — E119 Type 2 diabetes mellitus without complications: Secondary | ICD-10-CM | POA: Diagnosis not present

## 2015-04-08 DIAGNOSIS — Z923 Personal history of irradiation: Secondary | ICD-10-CM | POA: Diagnosis not present

## 2015-04-08 DIAGNOSIS — Z87891 Personal history of nicotine dependence: Secondary | ICD-10-CM | POA: Diagnosis not present

## 2015-04-08 DIAGNOSIS — Z85118 Personal history of other malignant neoplasm of bronchus and lung: Secondary | ICD-10-CM | POA: Diagnosis not present

## 2015-04-08 DIAGNOSIS — D381 Neoplasm of uncertain behavior of trachea, bronchus and lung: Secondary | ICD-10-CM | POA: Diagnosis present

## 2015-04-08 DIAGNOSIS — I10 Essential (primary) hypertension: Secondary | ICD-10-CM | POA: Diagnosis not present

## 2015-04-08 DIAGNOSIS — A419 Sepsis, unspecified organism: Secondary | ICD-10-CM | POA: Diagnosis not present

## 2015-04-08 NOTE — Progress Notes (Signed)
See progress note under physician encounter. 

## 2015-04-08 NOTE — Progress Notes (Addendum)
Weight and vital stable. Reports frequency of cough has increased. Reports cough is productive with yellow tinged sputum. Reports a decrease in appetite and decrease in portion size. Reports he is "weak as a kitten." Denies difficult or painful swallowing. Reports left chest pain 2 on a scale of 0-10 most when he coughs. Lips and finger tips are blue. Pulse ox 80%. Oxygen 2 liters applied. Patient does not have oxygen therapy at home. Will discuss with Dr. Tammi Klippel ordering home O2. Uses three point cane at home to ambulate. Patient denies any recent falls. Reports dyspnea with mild exertion. Report difficulty finding words at time. Speech broken as he catches his breath.  BP 116/60 mmHg  Pulse 100  Temp(Src) 97.8 F (36.6 C) (Oral)  Resp 22  Wt 211 lb 1.6 oz (95.754 kg)  SpO2 80% Wt Readings from Last 3 Encounters:  04/08/15 211 lb 1.6 oz (95.754 kg)  03/25/15 210 lb (95.255 kg)  07/25/11 205 lb 0.4 oz (93 kg)

## 2015-04-08 NOTE — Progress Notes (Signed)
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    ICD-9-CM ICD-10-CM   1. Neoplasm of uncertain behavior of trachea, bronchus, and lung 235.7 D38.1       ICD-9-CM ICD-10-CM   1. SOB (shortness of breath) 786.05 R06.02 For home use only DME oxygen  2. Cancer of upper lobe of left lung 162.3 C34.12 DG Chest 2 View  3. Dyspnea 786.09 R06.00 For home use only DME oxygen    Narrative:  The patient returns today for routine follow-up.  We discussed case in [place] and were planning to recommend PET CT to proceed with evaluation and possible SBRT for left upper lung nodules.  However, his condition has worsened.  Weight and vital stable. Reports frequency of cough has increased. Reports cough is productive with yellow tinged sputum. Reports a decrease in appetite and decrease in portion size. Reports he is "weak as a kitten." Denies difficult or painful swallowing. Reports left chest pain 2 on a scale of 0-10 most when he coughs. Lips and finger tips are blue. Pulse ox 80%. Oxygen 2 liters applied. Patient does not have oxygen therapy at home. Uses three point cane at home to ambulate. Patient denies any recent falls. Reports dyspnea with mild exertion. Report difficulty finding words at time. Speech broken as he catches his breath. The patient sees Dr. Felipa Eth, his PCP, about twice a year.    ALLERGIES:  has No Known Allergies.  Meds: No current facility-administered medications for this encounter.   No current outpatient prescriptions on file.   Facility-Administered Medications Ordered in Other Encounters  Medication Dose Route Frequency Provider Last Rate Last Dose  . albuterol  (PROVENTIL) (2.5 MG/3ML) 0.083% nebulizer solution 2.5 mg  2.5 mg Nebulization Q2H PRN Theodis Blaze, MD      . atorvastatin (LIPITOR) tablet 10 mg  10 mg Oral q1800 Etta Quill, DO   10 mg at 04/11/15 1819  . budesonide-formoterol (SYMBICORT) 80-4.5 MCG/ACT inhaler 2 puff  2 puff Inhalation BID Etta Quill, DO   2 puff at 04/11/15 0911  . cholecalciferol (VITAMIN D) tablet 1,000 Units  1,000 Units Oral Daily Etta Quill, DO   1,000 Units at 04/11/15 0950  . gabapentin (NEURONTIN) tablet 600 mg  600 mg Oral QHS Etta Quill, DO   600 mg at 04/10/15 2124  . guaiFENesin (MUCINEX) 12 hr tablet 600 mg  600 mg Oral BID Theodis Blaze, MD   600 mg at 04/11/15 1345  . guaifenesin (ROBITUSSIN) 100 MG/5ML syrup 200 mg  200 mg Oral Q4H PRN Theodis Blaze, MD   200 mg at 04/11/15 1820  . heparin injection 5,000 Units  5,000 Units Subcutaneous 3 times per day Etta Quill, DO   5,000 Units at 04/11/15 1400  . insulin aspart (novoLOG) injection 0-9 Units  0-9 Units Subcutaneous TID WC Etta Quill, DO   0 Units at 04/10/15 0800  . latanoprost (XALATAN) 0.005 % ophthalmic solution 1 drop  1 drop Both Eyes QHS Etta Quill, DO   1 drop at 04/10/15 2200  . lisinopril (PRINIVIL,ZESTRIL) tablet 2.5 mg  2.5 mg Oral Daily Etta Quill, DO  2.5 mg at 04/11/15 0950  . piperacillin-tazobactam (ZOSYN) IVPB 3.375 g  3.375 g Intravenous Q8H Veronda P Bryk, RPH   3.375 g at 04/11/15 1200  . tiotropium (SPIRIVA) inhalation capsule 18 mcg  18 mcg Inhalation Daily Etta Quill, DO   18 mcg at 04/11/15 0912  . triamcinolone (KENALOG) 2.423 % cream 1 application  1 application Topical [id number] Etta Quill, DO   1 application at 43/15/40 1820  . vancomycin (VANCOCIN) IVPB 750 mg/150 ml premix  750 mg Intravenous Q12H Laren Everts, RPH   750 mg at 04/11/15 0867    Physical Findings: The patient is in no acute distress. Patient is alert and oriented.  weight is 211 lb 1.6 oz (95.754 kg). His oral  temperature is 97.8 F (36.6 C). His blood pressure is 116/60 and his pulse is 100. His respiration is 22 and oxygen saturation is 91%. .  Tachycardic but regular, lung sounds distant throughout. No significant changes.  Lab Findings: Lab Results  Component Value Date   WBC 9.7 04/11/2015   HGB 13.9 04/11/2015   HCT 45.2 04/11/2015   PLT 160 04/11/2015    Lab Results  Component Value Date   NA 138 04/11/2015   K 4.4 04/11/2015   CO2 33* 04/11/2015   GLUCOSE 104* 04/11/2015   BUN 15 04/11/2015   CREATININE 0.88 04/11/2015   CALCIUM 8.6* 04/11/2015   ANIONGAP 8 04/11/2015    Radiographic Findings: Dg Chest 2 View  04/09/2015   CLINICAL DATA:  COPD exacerbation since yesterday. Shortness of breath, weakness, left-sided chest pain.  EXAM: CHEST  2 VIEW  COMPARISON:  Chest 04/08/2015.  CT chest 03/01/2015  FINDINGS: Postoperative changes in the mediastinum. Normal heart size and pulmonary vascularity. Calcified and tortuous aorta. Focal opacities demonstrated in the right upper and right mid lung as well as in the left lung base. These were present on the previous study without significant interval change. Similar findings have been present on radiographs dating back to 07/21/2011. Correlation with previous outside chest CT suggested these likely represent areas of confluent fibrosis or scarring. Blunting of costophrenic angles suggesting thickened pleura. Focal infiltration in the left lung base could indicate a superimposed area of pneumonia. This does not present on the previous CT. Scattered calcified granulomas. No pneumothorax. Hyperinflation suggesting emphysema. Mild diffuse interstitial pattern suggesting fibrosis.  IMPRESSION: Focal confluent areas of infiltration or scarring in the right mid lung and right upper lung as well as left lower lung. Emphysematous changes and interstitial fibrosis. Possible superimposed developing infiltration in the left lung base posteriorly. Appearance  similar to yesterday's study.   Electronically Signed   By: Lucienne Capers M.D.   On: 04/09/2015 18:07   Dg Chest 2 View  04/08/2015   CLINICAL DATA:  History of lung cancer, recent increase in shortness of breath and decreased O2 sats.  EXAM: CHEST  2 VIEW  COMPARISON:  Chest x-ray dated 07/21/2011. Comparison also made to outside chest CT dated 03/01/2015.  FINDINGS: There are patchy airspace opacities at each lung base which are not convincingly seen on the recent chest CT, or are at least mildly increased suggesting developing pneumonia or edema. The confluent consolidations within the right mid to upper lung regions correspond to consolidations versus confluent scarring/fibrosis identified on the recent chest CT.  There is no pleural effusion seen. No pneumothorax. Cardiomediastinal silhouette is stable in size and configuration. Median sternotomy wires appear intact and stable in alignment. No acute osseous abnormality  seen.  IMPRESSION: Confluent airspace opacities within the right mid to upper lung regions correspond to consolidations versus chronic confluent scarring/fibrosis identified on the recent outside chest CT (favor chronic confluent scarring/fibrosis). Alternatively, this may be related to the given history of lung cancer (treated disease?). The report from the recent outside chest CTs are unavailable.  Increased patchy airspace opacities at each lung base, particularly on the left, suspicious for developing bibasilar pneumonias or asymmetric edema, less likely atelectasis.   Electronically Signed   By: Franki Cabot M.D.   On: 04/08/2015 12:46   Ct Angio Chest Pe W/cm &/or Wo Cm  04/09/2015   CLINICAL DATA:  81 year old male with extreme shortness of breath and leg swelling. History of COPD. Review of electronic records states "cancer of upper lobe of left lung".  EXAM: CT ANGIOGRAPHY CHEST WITH CONTRAST  TECHNIQUE: Multidetector CT imaging of the chest was performed using the standard  protocol during bolus administration of intravenous contrast. Multiplanar CT image reconstructions and MIPs were obtained to evaluate the vascular anatomy.  CONTRAST:  161m OMNIPAQUE IOHEXOL 350 MG/ML SOLN  COMPARISON:  Radiographs earlier today. Chest CT 03/01/2015, 02/27/2014, 07/22/2011  FINDINGS: There are no filling defects within the pulmonary arteries to suggest pulmonary embolus.  Calcified and noncalcified atheromatous plaque throughout thoracic aorta which tortuous. No aneurysmal dilatation. No evidence of dissection or acute aortic abnormality. Patient is post median sternotomy with calcifications of the native coronary arteries. There is mild multi chamber cardiomegaly. Small bilateral pleural effusions. No pericardial effusion.  Prominent mediastinal lymph nodes. For example, left lower paratracheal lymph node measures 10 mm in short axis dimension, previously 8 mm. Right lower paratracheal/anterior carinal lymph node measures 12 mm short axis dimension, previously 7 mm. Left lower hilar lymph node measures 11 mm in short axis dimension, direct comparison difficult given lack of contrast on prior exam. No definite right hilar adenopathy.  The esophagus is patulous with minimal fluid in the midportion.  Chronic opacity in the right upper lobe with air bronchograms. Chronic opacity in the right middle lobe with air bronchograms and punctate calcifications. Chronic opacity in the lingula. These are all unchanged dating back to 2012.  There are bilateral patchy lower lobe opacities dependently abutting the diaphragms, some of which appear slightly nodular, new from recent prior exam.  Left upper lobe nodule small central cavitation measures 9 mm, unchanged by my retrospective measurement. Left upper lobe nodule and subpleural region anteriorly measures 10 x 7 mm previously 7 x 6 mm by my retrospective measurement. Emphysematous change at the lung apices.  Evaluation of the upper abdomen demonstrates  minimal thickening of the left adrenal gland without discrete nodule. No definite acute abnormality.  Fractures of right lateral third and fourth ribs are unchanged from recent prior with minimal surrounding scleroses. No acute fracture. No destructive lytic or sclerotic lesions.  Review of the MIP images confirms the above findings.  IMPRESSION: 1. No pulmonary embolus. 2. New bilateral lower lobe patchy and slightly nodular opacities dependently. This may reflect pneumonia or aspiration. Neoplasm is not entirely excluded, however is thought less likely given development over 5 weeks. 3. Chronic opacities in the lingula, right middle and right upper lobe dating back to 2012, likely scarring. 4. Left upper lobe nodules, 1 of which anteriorly has increased in size currently measuring 10 mm. Small 979mcavitary nodule is unchanged. Findings are suspicious for malignancy. 5. Small bilateral pleural effusions, increased from prior. Mild increase in mediastinal lymph nodes, may be reactive  or neoplastic, reactive being favored.   Electronically Signed   By: Jeb Levering M.D.   On: 04/09/2015 22:28    Impression:  The patient has a low oxygen saturation level of 80% today. In light of this, we will hold off on having a PET-CT done at this time.   Plan:  Chest X-ray today. Start home oxygen and refer back to his PCP, Dr. Felipa Eth. If he can be stabilized/improved, may revisit management of left upper lung nodules.  However his declining condition and hypoxia are certainly the first priority.   This document serves as a record of services personally performed by Tyler Pita, MD. It was created on his behalf by Arlyce Harman, a trained medical scribe. The creation of this record is based on the scribe's personal observations and the provider's statements to them. This document has been checked and approved by the attending provider.    _____________________________________  Sheral Apley. Tammi Klippel,  M.D.

## 2015-04-09 ENCOUNTER — Telehealth: Payer: Self-pay | Admitting: *Deleted

## 2015-04-09 ENCOUNTER — Encounter (HOSPITAL_COMMUNITY): Payer: Self-pay | Admitting: *Deleted

## 2015-04-09 ENCOUNTER — Telehealth: Payer: Self-pay | Admitting: Radiation Oncology

## 2015-04-09 ENCOUNTER — Emergency Department (HOSPITAL_COMMUNITY): Payer: Medicare Other

## 2015-04-09 ENCOUNTER — Inpatient Hospital Stay (HOSPITAL_COMMUNITY)
Admission: EM | Admit: 2015-04-09 | Discharge: 2015-04-21 | DRG: 871 | Disposition: A | Discharge status: Skilled Nursing Facility | Payer: Medicare Other | Attending: Internal Medicine | Admitting: Internal Medicine

## 2015-04-09 DIAGNOSIS — J441 Chronic obstructive pulmonary disease with (acute) exacerbation: Secondary | ICD-10-CM | POA: Diagnosis not present

## 2015-04-09 DIAGNOSIS — Z66 Do not resuscitate: Secondary | ICD-10-CM | POA: Diagnosis present

## 2015-04-09 DIAGNOSIS — I2510 Atherosclerotic heart disease of native coronary artery without angina pectoris: Secondary | ICD-10-CM | POA: Diagnosis present

## 2015-04-09 DIAGNOSIS — Z6831 Body mass index (BMI) 31.0-31.9, adult: Secondary | ICD-10-CM

## 2015-04-09 DIAGNOSIS — Z87891 Personal history of nicotine dependence: Secondary | ICD-10-CM | POA: Diagnosis not present

## 2015-04-09 DIAGNOSIS — J449 Chronic obstructive pulmonary disease, unspecified: Secondary | ICD-10-CM | POA: Diagnosis present

## 2015-04-09 DIAGNOSIS — E872 Acidosis: Secondary | ICD-10-CM | POA: Diagnosis present

## 2015-04-09 DIAGNOSIS — Z85118 Personal history of other malignant neoplasm of bronchus and lung: Secondary | ICD-10-CM | POA: Diagnosis not present

## 2015-04-09 DIAGNOSIS — C3412 Malignant neoplasm of upper lobe, left bronchus or lung: Secondary | ICD-10-CM | POA: Diagnosis present

## 2015-04-09 DIAGNOSIS — E119 Type 2 diabetes mellitus without complications: Secondary | ICD-10-CM | POA: Diagnosis present

## 2015-04-09 DIAGNOSIS — Y95 Nosocomial condition: Secondary | ICD-10-CM | POA: Diagnosis present

## 2015-04-09 DIAGNOSIS — J9602 Acute respiratory failure with hypercapnia: Secondary | ICD-10-CM | POA: Diagnosis present

## 2015-04-09 DIAGNOSIS — Z923 Personal history of irradiation: Secondary | ICD-10-CM | POA: Diagnosis not present

## 2015-04-09 DIAGNOSIS — A419 Sepsis, unspecified organism: Secondary | ICD-10-CM | POA: Diagnosis present

## 2015-04-09 DIAGNOSIS — J849 Interstitial pulmonary disease, unspecified: Secondary | ICD-10-CM | POA: Diagnosis present

## 2015-04-09 DIAGNOSIS — Z955 Presence of coronary angioplasty implant and graft: Secondary | ICD-10-CM

## 2015-04-09 DIAGNOSIS — I10 Essential (primary) hypertension: Secondary | ICD-10-CM | POA: Diagnosis present

## 2015-04-09 DIAGNOSIS — R1310 Dysphagia, unspecified: Secondary | ICD-10-CM | POA: Diagnosis not present

## 2015-04-09 DIAGNOSIS — R410 Disorientation, unspecified: Secondary | ICD-10-CM | POA: Diagnosis present

## 2015-04-09 DIAGNOSIS — E8729 Other acidosis: Secondary | ICD-10-CM | POA: Diagnosis present

## 2015-04-09 DIAGNOSIS — E669 Obesity, unspecified: Secondary | ICD-10-CM | POA: Diagnosis present

## 2015-04-09 DIAGNOSIS — R0602 Shortness of breath: Secondary | ICD-10-CM | POA: Diagnosis present

## 2015-04-09 DIAGNOSIS — R4182 Altered mental status, unspecified: Secondary | ICD-10-CM | POA: Diagnosis present

## 2015-04-09 DIAGNOSIS — J9601 Acute respiratory failure with hypoxia: Secondary | ICD-10-CM | POA: Diagnosis present

## 2015-04-09 DIAGNOSIS — J189 Pneumonia, unspecified organism: Secondary | ICD-10-CM | POA: Diagnosis not present

## 2015-04-09 DIAGNOSIS — E440 Moderate protein-calorie malnutrition: Secondary | ICD-10-CM | POA: Diagnosis present

## 2015-04-09 DIAGNOSIS — M7989 Other specified soft tissue disorders: Secondary | ICD-10-CM

## 2015-04-09 DIAGNOSIS — E118 Type 2 diabetes mellitus with unspecified complications: Secondary | ICD-10-CM | POA: Diagnosis not present

## 2015-04-09 DIAGNOSIS — R6889 Other general symptoms and signs: Secondary | ICD-10-CM

## 2015-04-09 DIAGNOSIS — G92 Toxic encephalopathy: Secondary | ICD-10-CM | POA: Diagnosis not present

## 2015-04-09 DIAGNOSIS — C3492 Malignant neoplasm of unspecified part of left bronchus or lung: Secondary | ICD-10-CM | POA: Diagnosis present

## 2015-04-09 LAB — URINALYSIS, ROUTINE W REFLEX MICROSCOPIC
Glucose, UA: NEGATIVE mg/dL
Hgb urine dipstick: NEGATIVE
KETONES UR: NEGATIVE mg/dL
LEUKOCYTES UA: NEGATIVE
NITRITE: NEGATIVE
Protein, ur: 100 mg/dL — AB
SPECIFIC GRAVITY, URINE: 1.027 (ref 1.005–1.030)
UROBILINOGEN UA: 1 mg/dL (ref 0.0–1.0)
pH: 5.5 (ref 5.0–8.0)

## 2015-04-09 LAB — BASIC METABOLIC PANEL
Anion gap: 7 (ref 5–15)
BUN: 22 mg/dL — AB (ref 6–20)
CALCIUM: 9.3 mg/dL (ref 8.9–10.3)
CHLORIDE: 101 mmol/L (ref 101–111)
CO2: 29 mmol/L (ref 22–32)
CREATININE: 0.94 mg/dL (ref 0.61–1.24)
GFR calc Af Amer: >60 mL/min (ref >60)
GFR calc non Af Amer: >60 mL/min (ref >60)
GLUCOSE: 104 mg/dL — AB (ref 65–99)
Potassium: 4.3 mmol/L (ref 3.5–5.1)
Sodium: 137 mmol/L (ref 135–145)

## 2015-04-09 LAB — URINE MICROSCOPIC-ADD ON

## 2015-04-09 LAB — PROCALCITONIN: Procalcitonin: 0.31 ng/mL

## 2015-04-09 LAB — CBC
HCT: 44.2 % (ref 39.0–52.0)
Hemoglobin: 14.1 g/dL (ref 13.0–17.0)
MCH: 30.3 pg (ref 26.0–34.0)
MCHC: 31.9 g/dL (ref 30.0–36.0)
MCV: 94.8 fL (ref 78.0–100.0)
PLATELETS: 176 10*3/uL (ref 150–400)
RBC: 4.66 MIL/uL (ref 4.22–5.81)
RDW: 16.4 % — AB (ref 11.5–15.5)
WBC: 12.9 10*3/uL — ABNORMAL HIGH (ref 4.0–10.5)

## 2015-04-09 LAB — I-STAT TROPONIN, ED: TROPONIN I, POC: 0.04 ng/mL (ref 0.00–0.08)

## 2015-04-09 LAB — I-STAT CG4 LACTIC ACID, ED
Lactic Acid, Venous: 1.69 mmol/L (ref 0.5–2.0)
Lactic Acid, Venous: 2.27 mmol/L (ref 0.5–2.0)

## 2015-04-09 LAB — D-DIMER, QUANTITATIVE: D-Dimer, Quant: 2.66 ug/mL-FEU — ABNORMAL HIGH (ref 0.00–0.48)

## 2015-04-09 LAB — BRAIN NATRIURETIC PEPTIDE: B NATRIURETIC PEPTIDE 5: 436.6 pg/mL — AB (ref 0.0–100.0)

## 2015-04-09 MED ORDER — SODIUM CHLORIDE 0.9 % IV BOLUS (SEPSIS)
500.0000 mL | Freq: Once | INTRAVENOUS | Status: AC
  Administered 2015-04-09: 500 mL via INTRAVENOUS

## 2015-04-09 MED ORDER — PIPERACILLIN-TAZOBACTAM 3.375 G IVPB: 3.3750 g | Freq: Once | INTRAVENOUS | Status: DC

## 2015-04-09 MED ORDER — INSULIN ASPART 100 UNIT/ML ~~LOC~~ SOLN
0.0000 [IU] | Freq: Three times a day (TID) | SUBCUTANEOUS | Status: DC
  Administered 2015-04-12 – 2015-04-14 (×4): 1 [IU] via SUBCUTANEOUS
  Administered 2015-04-16 – 2015-04-17 (×3): 2 [IU] via SUBCUTANEOUS
  Administered 2015-04-19 – 2015-04-20 (×2): 1 [IU] via SUBCUTANEOUS

## 2015-04-09 MED ORDER — VITAMIN D 1000 UNITS PO TABS
1000.0000 [IU] | ORAL_TABLET | Freq: Every day | ORAL | Status: DC
  Administered 2015-04-10 – 2015-04-11 (×2): 1000 [IU] via ORAL
  Filled 2015-04-09 (×2): qty 1

## 2015-04-09 MED ORDER — ATORVASTATIN CALCIUM 10 MG PO TABS
10.0000 mg | ORAL_TABLET | Freq: Every day | ORAL | Status: DC
  Administered 2015-04-10 – 2015-04-11 (×2): 10 mg via ORAL
  Filled 2015-04-09 (×2): qty 1

## 2015-04-09 MED ORDER — IPRATROPIUM-ALBUTEROL 0.5-2.5 (3) MG/3ML IN SOLN
3.0000 mL | RESPIRATORY_TRACT | Status: AC
  Administered 2015-04-09 (×3): 3 mL via RESPIRATORY_TRACT
  Filled 2015-04-09: qty 9

## 2015-04-09 MED ORDER — GABAPENTIN 300 MG PO CAPS: 600.0000 mg | ORAL_CAPSULE | Freq: Every day | ORAL | Status: DC

## 2015-04-09 MED ORDER — PIPERACILLIN-TAZOBACTAM IN DEX 2-0.25 GM/50ML IV SOLN
3.3750 g | Freq: Once | INTRAVENOUS | Status: DC
  Filled 2015-04-09: qty 100

## 2015-04-09 MED ORDER — VANCOMYCIN HCL IN DEXTROSE 750-5 MG/150ML-% IV SOLN
750.0000 mg | Freq: Two times a day (BID) | INTRAVENOUS | Status: DC
  Administered 2015-04-10 – 2015-04-16 (×13): 750 mg via INTRAVENOUS
  Filled 2015-04-09 (×14): qty 150

## 2015-04-09 MED ORDER — SODIUM CHLORIDE 0.9 % IV SOLN
2000.0000 mg | Freq: Once | INTRAVENOUS | Status: AC
  Administered 2015-04-09: 2000 mg via INTRAVENOUS
  Filled 2015-04-09: qty 2000

## 2015-04-09 MED ORDER — TRIAMCINOLONE ACETONIDE 0.025 % EX CREA
1.0000 "application " | TOPICAL_CREAM | Freq: Every day | CUTANEOUS | Status: DC
  Administered 2015-04-11 – 2015-04-19 (×9): 1 via TOPICAL
  Filled 2015-04-09 (×2): qty 15

## 2015-04-09 MED ORDER — LATANOPROST 0.005 % OP SOLN
1.0000 [drp] | Freq: Every day | OPHTHALMIC | Status: DC
  Administered 2015-04-10 – 2015-04-20 (×12): 1 [drp] via OPHTHALMIC
  Filled 2015-04-09: qty 2.5

## 2015-04-09 MED ORDER — BUDESONIDE-FORMOTEROL FUMARATE 80-4.5 MCG/ACT IN AERO
2.0000 | INHALATION_SPRAY | Freq: Two times a day (BID) | RESPIRATORY_TRACT | Status: DC
  Administered 2015-04-10 – 2015-04-21 (×18): 2 via RESPIRATORY_TRACT
  Filled 2015-04-09 (×2): qty 6.9

## 2015-04-09 MED ORDER — SODIUM CHLORIDE 0.9 % IV SOLN
INTRAVENOUS | Status: DC
  Administered 2015-04-09: via INTRAVENOUS

## 2015-04-09 MED ORDER — ALBUTEROL SULFATE HFA 108 (90 BASE) MCG/ACT IN AERS: 1.0000 | INHALATION_SPRAY | Freq: Four times a day (QID) | RESPIRATORY_TRACT | Status: DC | PRN

## 2015-04-09 MED ORDER — TIOTROPIUM BROMIDE MONOHYDRATE 18 MCG IN CAPS
18.0000 ug | ORAL_CAPSULE | Freq: Every day | RESPIRATORY_TRACT | Status: DC
  Administered 2015-04-10 – 2015-04-13 (×3): 18 ug via RESPIRATORY_TRACT
  Filled 2015-04-09: qty 5

## 2015-04-09 MED ORDER — IOHEXOL 350 MG/ML SOLN
100.0000 mL | Freq: Once | INTRAVENOUS | Status: AC | PRN
  Administered 2015-04-09: 100 mL via INTRAVENOUS

## 2015-04-09 MED ORDER — LISINOPRIL 2.5 MG PO TABS
2.5000 mg | ORAL_TABLET | Freq: Every day | ORAL | Status: DC
  Administered 2015-04-10 – 2015-04-11 (×2): 2.5 mg via ORAL
  Filled 2015-04-09 (×3): qty 1

## 2015-04-09 MED ORDER — PIPERACILLIN-TAZOBACTAM 3.375 G IVPB 30 MIN
3.3750 g | Freq: Once | INTRAVENOUS | Status: AC
  Administered 2015-04-09: 3.375 g via INTRAVENOUS
  Filled 2015-04-09: qty 50

## 2015-04-09 MED ORDER — HEPARIN SODIUM (PORCINE) 5000 UNIT/ML IJ SOLN
5000.0000 [IU] | Freq: Three times a day (TID) | INTRAMUSCULAR | Status: DC
  Administered 2015-04-09 – 2015-04-16 (×21): 5000 [IU] via SUBCUTANEOUS
  Filled 2015-04-09 (×20): qty 1

## 2015-04-09 MED ORDER — PIPERACILLIN-TAZOBACTAM 3.375 G IVPB
3.3750 g | Freq: Three times a day (TID) | INTRAVENOUS | Status: DC
  Administered 2015-04-10 – 2015-04-16 (×20): 3.375 g via INTRAVENOUS
  Filled 2015-04-09 (×22): qty 50

## 2015-04-09 NOTE — Telephone Encounter (Signed)
Patient  Is going to see Dr. Felipa Eth today (04/09/15) @ 3:15 pm, patient is aware of this appt.

## 2015-04-09 NOTE — Progress Notes (Signed)
ANTIBIOTIC CONSULT NOTE - INITIAL  Pharmacy Consult for Vancocin and Zosyn Indication: rule out pneumonia  No Known Allergies  Patient Measurements: Height: '5\' 9"'$  (175.3 cm) Weight: 210 lb 9.6 oz (95.528 kg) IBW/kg (Calculated) : 70.7  Vital Signs: Temp: 98.9 F (37.2 C) (08/26 1627) Temp Source: Oral (08/26 1627) BP: 137/68 mmHg (08/26 2315) Pulse Rate: 107 (08/26 2315)  Labs:  Recent Labs  04/09/15 1636  WBC 12.9*  HGB 14.1  PLT 176  CREATININE 0.94   Estimated Creatinine Clearance: 57.2 mL/min (by C-G formula based on Cr of 0.94).   Medical History: Past Medical History  Diagnosis Date  . COPD (chronic obstructive pulmonary disease)   . Lung cancer   . Cardiac disorder   . Coronary artery disease   . Hypertension   . Diabetes mellitus   . COPD (chronic obstructive pulmonary disease) 07/20/2011  . DM type 2 (diabetes mellitus, type 2) 07/20/2011  . Hypertension 07/20/2011     Assessment: 81yo male c/o worsening SOB and cough over a few weeks, noted h/o lung ca, started on O2 yesterday but sx continue to worsen, CT reveals BLL opacities representing PNA or aspiration, to begin IV ABX.  Goal of Therapy:  Vancomycin trough level 15-20 mcg/ml  Plan:  Rec'd vanc 2g and Zosyn 3.375g IV in ED; will continue with vancomycin '750mg'$  IV Q12H and Zosyn 3.375g IV Q8H and monitor CBC, Cx, levels prn.  Wynona Neat, PharmD, BCPS  04/09/2015,11:28 PM

## 2015-04-09 NOTE — H&P (Signed)
[facility] History and Physical  CARLEY STRICKLING TIW:[id number] DOB: 12/29/1920 DOA: 04/09/2015  Referring physician: EDP PCP: Mathews Argyle, MD   Chief Complaint: SOB   HPI: Darrell Livingston is a 81 y.o. male who presents to the ED with c/o SOB and cough.  Symptoms have onset insidiously over the past couple of weeks.  Have been worsening.  Got put on O2 by his PCP yesterday and sent in to hospital today as these continued to worsen.  Patient has h/o lung CA, very small tumor burden with LUL nodules that is being managed by oncology (see onc note from yesterday).  Work up today demonstrates bilateral lower lobe Pneumonia, patient lives in a facility.  Review of Systems: Systems reviewed.  As above, otherwise negative  Past Medical History  Diagnosis Date  . COPD (chronic obstructive pulmonary disease)   . Lung cancer   . Cardiac disorder   . Coronary artery disease   . Hypertension   . Diabetes mellitus   . COPD (chronic obstructive pulmonary disease) 07/20/2011  . DM type 2 (diabetes mellitus, type 2) 07/20/2011  . Hypertension 07/20/2011   Past Surgical History  Procedure Laterality Date  . Cardiac surgery    . Cholecystectomy    . Coronary angioplasty with stent placement    . Eye surgery     Social History:  reports that he quit smoking about 18 years ago. His smoking use included Cigarettes. He has a 108 pack-year smoking history. He has never used smokeless tobacco. He reports that he drinks alcohol. He reports that he does not use illicit drugs.  No Known Allergies  Family History  Problem Relation Age of Onset  . Cancer Neg Hx      Prior to Admission medications   Medication Sig Start Date End Date Taking? Authorizing Provider  albuterol (PROVENTIL HFA;VENTOLIN HFA) 108 (90 BASE) MCG/ACT inhaler Inhale 1-2 puffs into the lungs every 6 (six) hours as needed for wheezing or shortness of breath.   Yes Historical Provider, MD  atorvastatin (LIPITOR)  20 MG tablet Take 10 mg by mouth daily at 6 PM.    Yes Historical Provider, MD  budesonide-formoterol (SYMBICORT) 80-4.5 MCG/ACT inhaler Inhale 2 puffs into the lungs 2 (two) times daily.   Yes Historical Provider, MD  cholecalciferol (VITAMIN D) 1000 UNITS tablet Take 1,000 Units by mouth daily. Take two tabets    Yes Historical Provider, MD  FUROSEMIDE PO Take 1 tablet by mouth daily at 12 noon.   Yes Historical Provider, MD  gabapentin (NEURONTIN) 300 MG capsule Take 600 mg by mouth at bedtime.    Yes Historical Provider, MD  latanoprost (XALATAN) 0.005 % ophthalmic solution Place 1 drop into both eyes at bedtime.    Yes Historical Provider, MD  lisinopril (PRINIVIL,ZESTRIL) 5 MG tablet Take 2.5 mg by mouth daily.   Yes Historical Provider, MD  metFORMIN (GLUCOPHAGE) 500 MG tablet Take 500 mg by mouth daily with breakfast.    Yes Historical Provider, MD  tiotropium (SPIRIVA) 18 MCG inhalation capsule Place 18 mcg into inhaler and inhale daily.     Yes Historical Provider, MD  triamcinolone (KENALOG) 0.025 % cream Apply 1 application topically daily at 12 noon.    Yes Historical Provider, MD   Physical Exam: Filed Vitals:   04/09/15 2300  BP: 122/70  Pulse: 111  Temp:   Resp: 20    BP 122/70 mmHg  Pulse 111  Temp(Src) 98.9 F (37.2 C) (Oral)  Resp 20  Ht '5\' 9"'$  (1.753 m)  Wt 95.528 kg (210 lb 9.6 oz)  BMI 31.09 kg/m2  SpO2 93%  General Appearance:    Alert, oriented, no distress, appears stated age  Head:    Normocephalic, atraumatic  Eyes:    PERRL, EOMI, sclera non-icteric        Nose:   Nares without drainage or epistaxis. Mucosa, turbinates normal  Throat:   Moist mucous membranes. Oropharynx without erythema or exudate.  Neck:   Supple. No carotid bruits.  No thyromegaly.  No lymphadenopathy.   Back:     No CVA tenderness, no spinal tenderness  Lungs:     Clear to auscultation bilaterally, without wheezes, rhonchi or rales  Chest wall:    No tenderness to palpitation   Heart:    Regular rate and rhythm without murmurs, gallops, rubs  Abdomen:     Soft, non-tender, nondistended, normal bowel sounds, no organomegaly  Genitalia:    deferred  Rectal:    deferred  Extremities:   No clubbing, cyanosis or edema.  Pulses:   2+ and symmetric all extremities  Skin:   Skin color, texture, turgor normal, no rashes or lesions  Lymph nodes:   Cervical, supraclavicular, and axillary nodes normal  Neurologic:   CNII-XII intact. Normal strength, sensation and reflexes      throughout    Labs on Admission:  Basic Metabolic Panel:  Recent Labs Lab 04/09/15 1636  NA 137  K 4.3  CL 101  CO2 29  GLUCOSE 104*  BUN 22*  CREATININE 0.94  CALCIUM 9.3   Liver Function Tests: No results for input(s): AST, ALT, ALKPHOS, BILITOT, PROT, ALBUMIN in the last 168 hours. No results for input(s): LIPASE, AMYLASE in the last 168 hours. No results for input(s): AMMONIA in the last 168 hours. CBC:  Recent Labs Lab 04/09/15 1636  WBC 12.9*  HGB 14.1  HCT 44.2  MCV 94.8  PLT 176   Cardiac Enzymes: No results for input(s): CKTOTAL, CKMB, CKMBINDEX, TROPONINI in the last 168 hours.  BNP (last 3 results) No results for input(s): PROBNP in the last 8760 hours. CBG: No results for input(s): GLUCAP in the last 168 hours.  Radiological Exams on Admission: Dg Chest 2 View  04/09/2015   CLINICAL DATA:  COPD exacerbation since yesterday. Shortness of breath, weakness, left-sided chest pain.  EXAM: CHEST  2 VIEW  COMPARISON:  Chest 04/08/2015.  CT chest 03/01/2015  FINDINGS: Postoperative changes in the mediastinum. Normal heart size and pulmonary vascularity. Calcified and tortuous aorta. Focal opacities demonstrated in the right upper and right mid lung as well as in the left lung base. These were present on the previous study without significant interval change. Similar findings have been present on radiographs dating back to 07/21/2011. Correlation with previous outside  chest CT suggested these likely represent areas of confluent fibrosis or scarring. Blunting of costophrenic angles suggesting thickened pleura. Focal infiltration in the left lung base could indicate a superimposed area of pneumonia. This does not present on the previous CT. Scattered calcified granulomas. No pneumothorax. Hyperinflation suggesting emphysema. Mild diffuse interstitial pattern suggesting fibrosis.  IMPRESSION: Focal confluent areas of infiltration or scarring in the right mid lung and right upper lung as well as left lower lung. Emphysematous changes and interstitial fibrosis. Possible superimposed developing infiltration in the left lung base posteriorly. Appearance similar to yesterday's study.   Electronically Signed   By: Lucienne Capers M.D.   On: 04/09/2015 18:07   Dg Chest  2 View  04/08/2015   CLINICAL DATA:  History of lung cancer, recent increase in shortness of breath and decreased O2 sats.  EXAM: CHEST  2 VIEW  COMPARISON:  Chest x-ray dated 07/21/2011. Comparison also made to outside chest CT dated 03/01/2015.  FINDINGS: There are patchy airspace opacities at each lung base which are not convincingly seen on the recent chest CT, or are at least mildly increased suggesting developing pneumonia or edema. The confluent consolidations within the right mid to upper lung regions correspond to consolidations versus confluent scarring/fibrosis identified on the recent chest CT.  There is no pleural effusion seen. No pneumothorax. Cardiomediastinal silhouette is stable in size and configuration. Median sternotomy wires appear intact and stable in alignment. No acute osseous abnormality seen.  IMPRESSION: Confluent airspace opacities within the right mid to upper lung regions correspond to consolidations versus chronic confluent scarring/fibrosis identified on the recent outside chest CT (favor chronic confluent scarring/fibrosis). Alternatively, this may be related to the given history of lung  cancer (treated disease?). The report from the recent outside chest CTs are unavailable.  Increased patchy airspace opacities at each lung base, particularly on the left, suspicious for developing bibasilar pneumonias or asymmetric edema, less likely atelectasis.   Electronically Signed   By: Franki Cabot M.D.   On: 04/08/2015 12:46   Ct Angio Chest Pe W/cm &/or Wo Cm  04/09/2015   CLINICAL DATA:  81 year old male with extreme shortness of breath and leg swelling. History of COPD. Review of electronic records states "cancer of upper lobe of left lung".  EXAM: CT ANGIOGRAPHY CHEST WITH CONTRAST  TECHNIQUE: Multidetector CT imaging of the chest was performed using the standard protocol during bolus administration of intravenous contrast. Multiplanar CT image reconstructions and MIPs were obtained to evaluate the vascular anatomy.  CONTRAST:  18m OMNIPAQUE IOHEXOL 350 MG/ML SOLN  COMPARISON:  Radiographs earlier today. Chest CT 03/01/2015, 02/27/2014, 07/22/2011  FINDINGS: There are no filling defects within the pulmonary arteries to suggest pulmonary embolus.  Calcified and noncalcified atheromatous plaque throughout thoracic aorta which tortuous. No aneurysmal dilatation. No evidence of dissection or acute aortic abnormality. Patient is post median sternotomy with calcifications of the native coronary arteries. There is mild multi chamber cardiomegaly. Small bilateral pleural effusions. No pericardial effusion.  Prominent mediastinal lymph nodes. For example, left lower paratracheal lymph node measures 10 mm in short axis dimension, previously 8 mm. Right lower paratracheal/anterior carinal lymph node measures 12 mm short axis dimension, previously 7 mm. Left lower hilar lymph node measures 11 mm in short axis dimension, direct comparison difficult given lack of contrast on prior exam. No definite right hilar adenopathy.  The esophagus is patulous with minimal fluid in the midportion.  Chronic opacity in the  right upper lobe with air bronchograms. Chronic opacity in the right middle lobe with air bronchograms and punctate calcifications. Chronic opacity in the lingula. These are all unchanged dating back to 2012.  There are bilateral patchy lower lobe opacities dependently abutting the diaphragms, some of which appear slightly nodular, new from recent prior exam.  Left upper lobe nodule small central cavitation measures 9 mm, unchanged by my retrospective measurement. Left upper lobe nodule and subpleural region anteriorly measures 10 x 7 mm previously 7 x 6 mm by my retrospective measurement. Emphysematous change at the lung apices.  Evaluation of the upper abdomen demonstrates minimal thickening of the left adrenal gland without discrete nodule. No definite acute abnormality.  Fractures of right lateral third and fourth  ribs are unchanged from recent prior with minimal surrounding scleroses. No acute fracture. No destructive lytic or sclerotic lesions.  Review of the MIP images confirms the above findings.  IMPRESSION: 1. No pulmonary embolus. 2. New bilateral lower lobe patchy and slightly nodular opacities dependently. This may reflect pneumonia or aspiration. Neoplasm is not entirely excluded, however is thought less likely given development over 5 weeks. 3. Chronic opacities in the lingula, right middle and right upper lobe dating back to 2012, likely scarring. 4. Left upper lobe nodules, 1 of which anteriorly has increased in size currently measuring 10 mm. Small 87m cavitary nodule is unchanged. Findings are suspicious for malignancy. 5. Small bilateral pleural effusions, increased from prior. Mild increase in mediastinal lymph nodes, may be reactive or neoplastic, reactive being favored.   Electronically Signed   By: MJeb LeveringM.D.   On: 04/09/2015 22:28    EKG: Independently reviewed.  Assessment/Plan Principal Problem:   HCAP (healthcare-associated pneumonia) Active Problems:   COPD (chronic  obstructive pulmonary disease)   DM type 2 (diabetes mellitus, type 2)   Hypertension   Cancer of upper lobe of left lung   1. HCAP - located in bilateral lower lobes 1. Zosyn and vanc 2. PNA pathway 3. Cultures pending 4. Gentle hydration 2. DM2 - 1. Hold metformin 2. Putting patient on low dose SSI ac/hs 3. HTN - continue home meds 4. COPD - continue home nebs 5. LUL lung cancer - small tumor burden as noted on CT scan, just saw oncology yesterday, not currently on systemic chemo.    Code Status: Full code  Family Communication: Son at bedside Disposition Plan: Admit to inpatient   Time spent: 70 min  Ilma Achee M. [facility] Pager [phone] If 7AM-7PM, please contact the day team taking care of the patient [email or web address] Password [email or web address] 04/09/2015, 11:18 PM

## 2015-04-09 NOTE — ED Provider Notes (Signed)
CSN: [id number]     Arrival date & time 04/09/15  1610 History   First MD Initiated Contact with Patient 04/09/15 1839     Chief Complaint  Patient presents with  . Shortness of Breath  . Cough   Patient is a 81 y.o. male presenting with general illness. The history is provided by the patient and a relative. No language interpreter was used.  Illness Location:  NA Quality:  SOB Severity:  Moderate Onset quality:  Gradual Timing:  Constant Progression:  Worsening Chronicity:  New Context:  Patient has past history of interstitial lung disease as well as severe COPD and newly diagnosed CHF. Patient was seen by his cardiologist for worsening shortness of breath and obtain a chest x-ray which had not resolved left concerning for cancerous lesions. Patient is consented for a PET scan. At that time patient had oxygen saturation to 80 on room air and was Associated symptoms: cough, fever and shortness of breath   Associated symptoms: no abdominal pain, no chest pain, no diarrhea, no nausea and no vomiting     Past Medical History  Diagnosis Date  . COPD (chronic obstructive pulmonary disease)   . Lung cancer   . Cardiac disorder   . Coronary artery disease   . Hypertension   . Diabetes mellitus   . COPD (chronic obstructive pulmonary disease) 07/20/2011  . DM type 2 (diabetes mellitus, type 2) 07/20/2011  . Hypertension 07/20/2011   Past Surgical History  Procedure Laterality Date  . Cardiac surgery    . Cholecystectomy    . Coronary angioplasty with stent placement    . Eye surgery     Family History  Problem Relation Age of Onset  . Cancer Neg Hx    Social History  Substance Use Topics  . Smoking status: Former Smoker -- 2.00 packs/day for 54 years    Types: Cigarettes    Quit date: 11/17/1996  . Smokeless tobacco: Never Used  . Alcohol Use: Yes     Comment: up to 2 drinks per day but not everyday per pt    Review of Systems  Constitutional: Positive for fever.   Respiratory: Positive for cough and shortness of breath.   Cardiovascular: Positive for leg swelling. Negative for chest pain and palpitations.  Gastrointestinal: Negative for nausea, vomiting, abdominal pain and diarrhea.  All other systems reviewed and are negative.   Allergies  Review of patient's allergies indicates no known allergies.  Home Medications   Prior to Admission medications   Medication Sig Start Date End Date Taking? Authorizing Provider  albuterol (PROVENTIL HFA;VENTOLIN HFA) 108 (90 BASE) MCG/ACT inhaler Inhale 1-2 puffs into the lungs every 6 (six) hours as needed for wheezing or shortness of breath.   Yes Historical Provider, MD  atorvastatin (LIPITOR) 20 MG tablet Take 10 mg by mouth daily at 6 PM.    Yes Historical Provider, MD  budesonide-formoterol (SYMBICORT) 80-4.5 MCG/ACT inhaler Inhale 2 puffs into the lungs 2 (two) times daily.   Yes Historical Provider, MD  cholecalciferol (VITAMIN D) 1000 UNITS tablet Take 1,000 Units by mouth daily. Take two tabets    Yes Historical Provider, MD  FUROSEMIDE PO Take 1 tablet by mouth daily at 12 noon.   Yes Historical Provider, MD  gabapentin (NEURONTIN) 300 MG capsule Take 600 mg by mouth at bedtime.    Yes Historical Provider, MD  latanoprost (XALATAN) 0.005 % ophthalmic solution Place 1 drop into both eyes at bedtime.    Yes Historical  Provider, MD  lisinopril (PRINIVIL,ZESTRIL) 5 MG tablet Take 2.5 mg by mouth daily.   Yes Historical Provider, MD  metFORMIN (GLUCOPHAGE) 500 MG tablet Take 500 mg by mouth daily with breakfast.    Yes Historical Provider, MD  tiotropium (SPIRIVA) 18 MCG inhalation capsule Place 18 mcg into inhaler and inhale daily.     Yes Historical Provider, MD  triamcinolone (KENALOG) 0.025 % cream Apply 1 application topically daily at 12 noon.    Yes Historical Provider, MD   BP 137/68 mmHg  Pulse 107  Temp(Src) 98.9 F (37.2 C) (Oral)  Resp 16  Ht '5\' 9"'$  (1.753 m)  Wt 210 lb 9.6 oz (95.528 kg)   BMI 31.09 kg/m2  SpO2 99%   Physical Exam  Constitutional: He is oriented to person, place, and time. No distress.  HENT:  Head: Normocephalic and atraumatic.  Eyes: Conjunctivae are normal. Pupils are equal, round, and reactive to light.  Neck: Normal range of motion. Neck supple.  Cardiovascular: Tachycardia present.   Pulmonary/Chest: No respiratory distress. He has no wheezes.  Coarse breath sounds, increased WOB, sat's 80% on RA, mid 90's on 3.5L  Abdominal: Soft. Bowel sounds are normal. He exhibits no distension. There is no tenderness.  Musculoskeletal: Normal range of motion.  Neurological: He is alert and oriented to person, place, and time.  Skin: He is not diaphoretic.  Nursing note and vitals reviewed.   ED Course  Procedures   Labs Review Labs Reviewed  BASIC METABOLIC PANEL - Abnormal; Notable for the following:    Glucose, Bld 104 (*)    BUN 22 (*)    All other components within normal limits  CBC - Abnormal; Notable for the following:    WBC 12.9 (*)    RDW 16.4 (*)    All other components within normal limits  D-DIMER, QUANTITATIVE (NOT AT [facility]) - Abnormal; Notable for the following:    D-Dimer, Quant 2.66 (*)    All other components within normal limits  BRAIN NATRIURETIC PEPTIDE - Abnormal; Notable for the following:    B Natriuretic Peptide 436.6 (*)    All other components within normal limits  URINALYSIS, ROUTINE W REFLEX MICROSCOPIC (NOT AT [facility]) - Abnormal; Notable for the following:    Color, Urine AMBER (*)    APPearance CLOUDY (*)    Bilirubin Urine SMALL (*)    Protein, ur 100 (*)    All other components within normal limits  URINE MICROSCOPIC-ADD ON - Abnormal; Notable for the following:    Crystals CA OXALATE CRYSTALS (*)    All other components within normal limits  I-STAT CG4 LACTIC ACID, ED - Abnormal; Notable for the following:    Lactic Acid, Venous 2.27 (*)    All other components within normal limits  CULTURE, BLOOD (ROUTINE X 2)   CULTURE, BLOOD (ROUTINE X 2)  URINE CULTURE  CULTURE, BLOOD (ROUTINE X 2)  CULTURE, BLOOD (ROUTINE X 2)  CULTURE, EXPECTORATED SPUTUM-ASSESSMENT  GRAM STAIN  PROCALCITONIN  HIV ANTIBODY (ROUTINE TESTING)  LEGIONELLA ANTIGEN, URINE  STREP PNEUMONIAE URINARY ANTIGEN  I-STAT TROPOININ, ED  I-STAT CG4 LACTIC ACID, ED    Imaging Review Dg Chest 2 View  04/09/2015   CLINICAL DATA:  COPD exacerbation since yesterday. Shortness of breath, weakness, left-sided chest pain.  EXAM: CHEST  2 VIEW  COMPARISON:  Chest 04/08/2015.  CT chest 03/01/2015  FINDINGS: Postoperative changes in the mediastinum. Normal heart size and pulmonary vascularity. Calcified and tortuous aorta. Focal opacities demonstrated  in the right upper and right mid lung as well as in the left lung base. These were present on the previous study without significant interval change. Similar findings have been present on radiographs dating back to 07/21/2011. Correlation with previous outside chest CT suggested these likely represent areas of confluent fibrosis or scarring. Blunting of costophrenic angles suggesting thickened pleura. Focal infiltration in the left lung base could indicate a superimposed area of pneumonia. This does not present on the previous CT. Scattered calcified granulomas. No pneumothorax. Hyperinflation suggesting emphysema. Mild diffuse interstitial pattern suggesting fibrosis.  IMPRESSION: Focal confluent areas of infiltration or scarring in the right mid lung and right upper lung as well as left lower lung. Emphysematous changes and interstitial fibrosis. Possible superimposed developing infiltration in the left lung base posteriorly. Appearance similar to yesterday's study.   Electronically Signed   By: Lucienne Capers M.D.   On: 04/09/2015 18:07   Dg Chest 2 View  04/08/2015   CLINICAL DATA:  History of lung cancer, recent increase in shortness of breath and decreased O2 sats.  EXAM: CHEST  2 VIEW  COMPARISON:   Chest x-ray dated 07/21/2011. Comparison also made to outside chest CT dated 03/01/2015.  FINDINGS: There are patchy airspace opacities at each lung base which are not convincingly seen on the recent chest CT, or are at least mildly increased suggesting developing pneumonia or edema. The confluent consolidations within the right mid to upper lung regions correspond to consolidations versus confluent scarring/fibrosis identified on the recent chest CT.  There is no pleural effusion seen. No pneumothorax. Cardiomediastinal silhouette is stable in size and configuration. Median sternotomy wires appear intact and stable in alignment. No acute osseous abnormality seen.  IMPRESSION: Confluent airspace opacities within the right mid to upper lung regions correspond to consolidations versus chronic confluent scarring/fibrosis identified on the recent outside chest CT (favor chronic confluent scarring/fibrosis). Alternatively, this may be related to the given history of lung cancer (treated disease?). The report from the recent outside chest CTs are unavailable.  Increased patchy airspace opacities at each lung base, particularly on the left, suspicious for developing bibasilar pneumonias or asymmetric edema, less likely atelectasis.   Electronically Signed   By: Franki Cabot M.D.   On: 04/08/2015 12:46   Ct Angio Chest Pe W/cm &/or Wo Cm  04/09/2015   CLINICAL DATA:  81 year old male with extreme shortness of breath and leg swelling. History of COPD. Review of electronic records states "cancer of upper lobe of left lung".  EXAM: CT ANGIOGRAPHY CHEST WITH CONTRAST  TECHNIQUE: Multidetector CT imaging of the chest was performed using the standard protocol during bolus administration of intravenous contrast. Multiplanar CT image reconstructions and MIPs were obtained to evaluate the vascular anatomy.  CONTRAST:  159m OMNIPAQUE IOHEXOL 350 MG/ML SOLN  COMPARISON:  Radiographs earlier today. Chest CT 03/01/2015, 02/27/2014,  07/22/2011  FINDINGS: There are no filling defects within the pulmonary arteries to suggest pulmonary embolus.  Calcified and noncalcified atheromatous plaque throughout thoracic aorta which tortuous. No aneurysmal dilatation. No evidence of dissection or acute aortic abnormality. Patient is post median sternotomy with calcifications of the native coronary arteries. There is mild multi chamber cardiomegaly. Small bilateral pleural effusions. No pericardial effusion.  Prominent mediastinal lymph nodes. For example, left lower paratracheal lymph node measures 10 mm in short axis dimension, previously 8 mm. Right lower paratracheal/anterior carinal lymph node measures 12 mm short axis dimension, previously 7 mm. Left lower hilar lymph node measures 11 mm in short  axis dimension, direct comparison difficult given lack of contrast on prior exam. No definite right hilar adenopathy.  The esophagus is patulous with minimal fluid in the midportion.  Chronic opacity in the right upper lobe with air bronchograms. Chronic opacity in the right middle lobe with air bronchograms and punctate calcifications. Chronic opacity in the lingula. These are all unchanged dating back to 2012.  There are bilateral patchy lower lobe opacities dependently abutting the diaphragms, some of which appear slightly nodular, new from recent prior exam.  Left upper lobe nodule small central cavitation measures 9 mm, unchanged by my retrospective measurement. Left upper lobe nodule and subpleural region anteriorly measures 10 x 7 mm previously 7 x 6 mm by my retrospective measurement. Emphysematous change at the lung apices.  Evaluation of the upper abdomen demonstrates minimal thickening of the left adrenal gland without discrete nodule. No definite acute abnormality.  Fractures of right lateral third and fourth ribs are unchanged from recent prior with minimal surrounding scleroses. No acute fracture. No destructive lytic or sclerotic lesions.   Review of the MIP images confirms the above findings.  IMPRESSION: 1. No pulmonary embolus. 2. New bilateral lower lobe patchy and slightly nodular opacities dependently. This may reflect pneumonia or aspiration. Neoplasm is not entirely excluded, however is thought less likely given development over 5 weeks. 3. Chronic opacities in the lingula, right middle and right upper lobe dating back to 2012, likely scarring. 4. Left upper lobe nodules, 1 of which anteriorly has increased in size currently measuring 10 mm. Small 108m cavitary nodule is unchanged. Findings are suspicious for malignancy. 5. Small bilateral pleural effusions, increased from prior. Mild increase in mediastinal lymph nodes, may be reactive or neoplastic, reactive being favored.   Electronically Signed   By: MJeb LeveringM.D.   On: 04/09/2015 22:28   I have personally reviewed and evaluated these images and lab results as part of my medical decision-making.   EKG Interpretation   Date/Time:  Friday April 09 2015 16:19:43 EDT Ventricular Rate:  102 PR Interval:  184 QRS Duration: 130 QT Interval:  368 QTC Calculation: 479 R Axis:   47 Text Interpretation:  Sinus tachycardia with occasional Premature  ventricular complexes Right bundle branch block Abnormal ECG RBBB no acute  ischemia Confirmed by MGerald Leitz([id number] on 04/09/2015 10:58:29 PM      MDM  Patient is a 81year old male presenting with worsening shortness of breath. Patient has past history of interstitial lung disease as well as severe COPD and newly diagnosed CHF. Patient was seen by his cardiologist for worsening shortness of breath and obtain a chest x-ray which had not resolved left concerning for cancerous lesions. Patient is consented for a PET scan. At that time patient had oxygen saturation to 80 on room air and was was sent home with oxygen as well as any more prescription of her furosemide. Patient has continued to progress with his shortness of  breath despite this.  Elderly male lying in bed in no acute distress. Mild tachycardia to 100s. Mild hip into the low 20s. Patient is normotensive currently. Saturations on room air were 80% but improved to 95 on 3 L nasal cannula. Patient has coarse breath sounds throughout worse on the left. Abdominal exam benign.  EKG showing no acute changes. First troponin is within normal limits. Blood cell count is elevated to 12.9. BNP elevated to 400. Patient with recent trip & SOB & LE edema so D-dimer ordered and was elevated to 2.66.  CTA obtained and remarkable for no evidence of pulmonary edema but showing bilateral patchy opacities concerning for pneumonia as well as possible neoplasm which was previously undiagnosed with an addition of underlying interstitial lung disease as well as mild bilateral pleural effusions. Blood cultures obtained. Vancomycin and Zosyn started for concern for age (patient limiving facility).  Initially proceeded with judicious fluid resuscitation given previous history of lower extremity swelling and unknown severity of CHF but given rising lactate acidosis from 1.69 to 2.27 repeated a bolus of fluids.  Given patient's ocular Department of 3 L, and his tachycardia to the 1 teens. As well as his rising lactic acidosis spoke with inpatient team and agreed that patient would be appropriate for stepdown unit.  Patient to the stepdown unit for further evaluation and management of his increased fracture or entrance of breath the setting of a slight interstitial lung disease with presumed overlying HCAP (living in facility). Patient and family understand and agree with the plan and had no further questions or concerns at this time.  Patient care discussed with him followed by my attending Dr. Thomasene Lot  Clinical Impression: 1. HCAP 2. ILD 3. COPD 4. CHF exacerbation 5. Increased oxygen demand 6. Lactic acidosis  Mayer Camel, MD 04/09/15 [place],  MD 04/10/15 2411

## 2015-04-09 NOTE — ED Notes (Signed)
Pt was sent for eval by his PCP for COPD exacerbation and CHF. Pt reports increasing SOB over the last few weeks. Pt noted to be 82% on 3L increased to  90%

## 2015-04-09 NOTE — ED Notes (Signed)
Lab at the bedside 

## 2015-04-09 NOTE — Telephone Encounter (Signed)
Phoned patient's son, [name]. Confirm home oxygen therapy was delivered last night by [facility]. Romie Jumper to arrange appointment for follow up with [place] and contact [name] later today.

## 2015-04-09 NOTE — ED Provider Notes (Signed)
I saw and evaluated the patient, reviewed the resident's note and I agree with the findings and plan.   EKG Interpretation   Date/Time:  Friday April 09 2015 16:19:43 EDT Ventricular Rate:  102 PR Interval:  184 QRS Duration: 130 QT Interval:  368 QTC Calculation: 479 R Axis:   47 Text Interpretation:  Sinus tachycardia with occasional Premature  ventricular complexes Right bundle branch block Abnormal ECG RBBB no acute  ischemia Confirmed by Gerald Leitz ([id number]) on 04/09/2015 10:58:29 PM      Patient is a 81 year old male presenting with increasing oxygen requirement shortest breath and cough. Patient was started on home O2. And also started on Lasix. We think that he has nodules suspicious for cancer. Patient scheduled for PET scan. Will need step down in the setting of rising lactate and SOB.  Courteney Julio Alm, MD 04/09/15 2258

## 2015-04-10 LAB — GLUCOSE, CAPILLARY
GLUCOSE-CAPILLARY: 107 mg/dL — AB (ref 65–99)
GLUCOSE-CAPILLARY: 93 mg/dL (ref 65–99)
Glucose-Capillary: 78 mg/dL (ref 65–99)
Glucose-Capillary: 110 mg/dL — ABNORMAL HIGH (ref 65–99)

## 2015-04-10 LAB — STREP PNEUMONIAE URINARY ANTIGEN: STREP PNEUMO URINARY ANTIGEN: NEGATIVE

## 2015-04-10 LAB — MRSA PCR SCREENING: MRSA BY PCR: NEGATIVE

## 2015-04-10 MED ORDER — CETYLPYRIDINIUM CHLORIDE 0.05 % MT LIQD
7.0000 mL | Freq: Two times a day (BID) | OROMUCOSAL | Status: DC
  Administered 2015-04-10 – 2015-04-11 (×3): 7 mL via OROMUCOSAL

## 2015-04-10 MED ORDER — ALBUTEROL SULFATE (2.5 MG/3ML) 0.083% IN NEBU: 2.5000 mg | INHALATION_SOLUTION | Freq: Four times a day (QID) | RESPIRATORY_TRACT | Status: DC | PRN

## 2015-04-10 MED ORDER — IPRATROPIUM-ALBUTEROL 0.5-2.5 (3) MG/3ML IN SOLN
3.0000 mL | Freq: Four times a day (QID) | RESPIRATORY_TRACT | Status: AC
  Administered 2015-04-10 – 2015-04-11 (×3): 3 mL via RESPIRATORY_TRACT
  Filled 2015-04-10 (×3): qty 3

## 2015-04-10 MED ORDER — GABAPENTIN 600 MG PO TABS
600.0000 mg | ORAL_TABLET | Freq: Every day | ORAL | Status: DC
  Administered 2015-04-10 (×2): 600 mg via ORAL
  Filled 2015-04-10 (×3): qty 1

## 2015-04-10 MED ORDER — ALBUTEROL SULFATE (2.5 MG/3ML) 0.083% IN NEBU: 2.5000 mg | INHALATION_SOLUTION | RESPIRATORY_TRACT | Status: DC | PRN

## 2015-04-10 NOTE — Progress Notes (Signed)
Pt arrived from the ED to [place] at 0000. All VS's WNL, pt has no complaints of pain, only complaint is his productive cough.  RN will continue to monitor.

## 2015-04-10 NOTE — Progress Notes (Signed)
Patient ID: Darrell Livingston, male   DOB: 03/21/1921, 81 y.o.   MRN: [id number]  [facility] PROGRESS NOTE  Darrell Livingston ASN:[id number] DOB: [birth year] DOA: 04/09/2015 PCP: Mathews Argyle, MD   Brief narrative:    81 y.o. male with known COPD, DM type II, LUL cancer, who presented to [facility] 04/09/2015 with main concern of several weeks duration of progressively worsening productive cough of yellow sputum, occasionally blood tinged, dyspnea with exertion and at rest, associated with subjective fevers, chills, malaise, poor oral intake. Pt was placed on oxygen via [place] by PCP and was referred to ED for further evaluation.   In ED, VS notable for T 79F, RR up to 29 and HR up to 116, oxygen saturation 84% on RA, blood work notable for WBC 12.9. CXR notable for bilateral lower lobe PNA. [facility] asked to admit to SDU for further evaluation and management.   Assessment/Plan:    Principal Problem:   Sepsis secondary to HCAP (healthcare-associated pneumonia), bilateral lower lobes - unknown pathogen at this time - continue vancomycin and zosyn day #2 for now and narrow down as quickly as possible - follow up on urine legionella and strep pneumo, sputum and blood culture  - repeat CBC in AM to follow up on WBC  Active Problems:   Acute hypoxic respiratory failure - secondary to HCAP imposed on known COPD - still requiring oxygen and may need to continue even upon discharge  - treat HCAP as noted above - provide BD's scheduled and as needed     COPD (chronic obstructive pulmonary disease) - no wheezing on exam this AM - BD as noted above     DM type 2 (diabetes mellitus, type 2) - reasonable inpatient control     Hypertension - reasonable inpatient control     Cancer of upper lobe of left lung - will notify oncologist of pt's admission     Obesity  - Body mass index is 31.24 kg/(m^2).  DVT prophylaxis - Heparin SQ  Code Status: Full.  Family Communication:  plan of care  discussed with the patient Disposition Plan: Home when stable.   IV access:  Peripheral IV  Procedures and diagnostic studies:    Dg Chest 2 View 04/09/2015 Focal confluent areas of infiltration or scarring in the right mid lung and right upper lung as well as left lower lung. Emphysematous changes and interstitial fibrosis. Possible superimposed developing infiltration in the left lung base posteriorly.  Dg Chest 2 View 04/08/2015 Confluent airspace opacities within the right mid to upper lung regions correspond to consolidations versus chronic confluent scarring/fibrosis identified on the recent outside chest CT (favor chronic confluent scarring/fibrosis).   Ct Angio Chest Pe W/cm &/or Wo Cm 04/09/2015  No pulmonary embolus. 2. New bilateral lower lobe patchy and slightly nodular opacities dependently. This may reflect pneumonia or aspiration. Neoplasm is not entirely excluded, however is thought less likely given development over 5 weeks. 3. Chronic opacities in the lingula, right middle and right upper lobe dating back to 2012, likely scarring. 4. Left upper lobe nodules, 1 of which anteriorly has increased in size currently measuring 10 mm. Small 32m cavitary nodule is unchanged. Findings are suspicious for malignancy. 5. Small bilateral pleural effusions, increased from prior. Mild increase in mediastinal lymph nodes, may be reactive or neoplastic, reactive being favored.    Medical Consultants:  None  Other Consultants:  None  IAnti-Infectives:   Vancomycin 8/26 --> Zosyn 8/26 -->  Darrell Westrup, MD  [facility]  Pager [phone]  If 7PM-7AM, please contact night-coverage [email or web address] Password [email or web address] 04/10/2015, 2:20 PM   LOS: 1 day   HPI/Subjective: No events overnight. Still with cough and exertional dyspnea.   Objective: Filed Vitals:   04/10/15 0800 04/10/15 0806 04/10/15 0807 04/10/15 1200  BP: 137/60   141/75  Pulse: 85  87 89  Temp: 97.3 F (36.3 C)   98.7 F (37.1  C)  TempSrc: Axillary   Axillary  Resp: '23  24 18  '$ Height:      Weight:      SpO2: 100% 80% 100% 97%    Intake/Output Summary (Last 24 hours) at 04/10/15 1420 Last data filed at 04/10/15 0400  Gross per 24 hour  Intake    375 ml  Output      0 ml  Net    375 ml    Exam:   General:  Pt is alert, follows commands appropriately, not in acute distress  Cardiovascular: Regular rate and rhythm, no rubs, no gallops  Respiratory: Bibasilar rhonchi, no wheezing   Abdomen: Soft, non tender, non distended, bowel sounds present, no guarding  Extremities: No edema, pulses DP and PT palpable bilaterally  Data Reviewed: Basic Metabolic Panel:  Recent Labs Lab 04/09/15 1636  NA 137  K 4.3  CL 101  CO2 29  GLUCOSE 104*  BUN 22*  CREATININE 0.94  CALCIUM 9.3   CBC:  Recent Labs Lab 04/09/15 1636  WBC 12.9*  HGB 14.1  HCT 44.2  MCV 94.8  PLT 176   Recent Results (from the past 240 hour(s))  Blood Culture (routine x 2)     Status: None (Preliminary result)   Collection Time: 04/09/15  7:49 PM  Result Value Ref Range Status   Specimen Description BLOOD RIGHT ARM  Final   Special Requests BOTTLES DRAWN AEROBIC AND ANAEROBIC 5CC  Final   Culture NO GROWTH < 24 HOURS  Final   Report Status PENDING  Incomplete  Blood Culture (routine x 2)     Status: None (Preliminary result)   Collection Time: 04/09/15  7:56 PM  Result Value Ref Range Status   Specimen Description BLOOD RIGHT HAND  Final   Special Requests BOTTLES DRAWN AEROBIC ONLY 3CC  Final   Culture NO GROWTH < 24 HOURS  Final   Report Status PENDING  Incomplete  Urine culture     Status: None (Preliminary result)   Collection Time: 04/09/15  8:27 PM  Result Value Ref Range Status   Specimen Description URINE, CLEAN CATCH  Final   Special Requests Normal  Final   Culture NO GROWTH < 24 HOURS  Final   Report Status PENDING  Incomplete  Culture, blood (routine x 2) Call MD if unable to obtain prior to  antibiotics being given     Status: None (Preliminary result)   Collection Time: 04/09/15 11:55 PM  Result Value Ref Range Status   Specimen Description BLOOD LEFT HAND  Final   Special Requests BOTTLES DRAWN AEROBIC AND ANAEROBIC 5CC  Final   Culture PENDING  Incomplete   Report Status PENDING  Incomplete  MRSA PCR Screening     Status: None   Collection Time: 04/10/15 12:09 AM  Result Value Ref Range Status   MRSA by PCR NEGATIVE NEGATIVE Final   Scheduled Meds: . atorvastatin  10 mg Oral q1800  . budesonide-formoterol  2 puff Inhalation BID  . gabapentin  600 mg Oral QHS  . heparin  5,000 Units Subcutaneous 3  times per day  . insulin aspart  0-9 Units Subcutaneous TID WC  . latanoprost  1 drop Both Eyes QHS  . lisinopril  2.5 mg Oral Daily  . ZOSYN  IV  3.375 g Intravenous Q8H  . tiotropium  18 mcg Inhalation Daily  . triamcinolone  1 application Topical G9842  . vancomycin  750 mg Intravenous Q12H   Continuous Infusions: . sodium chloride 75 mL/hr at 04/09/15 2345

## 2015-04-10 NOTE — Progress Notes (Signed)
Upon arrival to patient room to give inhalers, noted patient to have a pulse ox reading of 80%, with a good waveform, on 6L nasal cannula.  Patient also appeared to have blue lips.  Was placed on 50% venturi mask with improvement in sats to 100%.  Patient tolerating well.  Will continue to monitor.

## 2015-04-11 LAB — CBC
HCT: 45.2 % (ref 39.0–52.0)
HEMOGLOBIN: 13.9 g/dL (ref 13.0–17.0)
MCH: 30.5 pg (ref 26.0–34.0)
MCHC: 30.8 g/dL (ref 30.0–36.0)
MCV: 99.1 fL (ref 78.0–100.0)
PLATELETS: 160 10*3/uL (ref 150–400)
RBC: 4.56 MIL/uL (ref 4.22–5.81)
RDW: 16.2 % — ABNORMAL HIGH (ref 11.5–15.5)
WBC: 9.7 10*3/uL (ref 4.0–10.5)

## 2015-04-11 LAB — GLUCOSE, CAPILLARY
GLUCOSE-CAPILLARY: 96 mg/dL (ref 65–99)
Glucose-Capillary: 133 mg/dL — ABNORMAL HIGH (ref 65–99)
Glucose-Capillary: 95 mg/dL (ref 65–99)
Glucose-Capillary: 125 mg/dL — ABNORMAL HIGH (ref 65–99)

## 2015-04-11 LAB — BASIC METABOLIC PANEL
Anion gap: 8 (ref 5–15)
BUN: 15 mg/dL (ref 6–20)
CHLORIDE: 97 mmol/L — AB (ref 101–111)
CO2: 33 mmol/L — ABNORMAL HIGH (ref 22–32)
CREATININE: 0.88 mg/dL (ref 0.61–1.24)
Calcium: 8.6 mg/dL — ABNORMAL LOW (ref 8.9–10.3)
Glucose, Bld: 104 mg/dL — ABNORMAL HIGH (ref 65–99)
POTASSIUM: 4.4 mmol/L (ref 3.5–5.1)
SODIUM: 138 mmol/L (ref 135–145)

## 2015-04-11 LAB — PROCALCITONIN: PROCALCITONIN: 0.18 ng/mL

## 2015-04-11 LAB — HIV ANTIBODY (ROUTINE TESTING W REFLEX): HIV Screen 4th Generation wRfx: NONREACTIVE

## 2015-04-11 MED ORDER — GUAIFENESIN 100 MG/5ML PO SYRP
200.0000 mg | ORAL_SOLUTION | ORAL | Status: DC | PRN
  Administered 2015-04-11: 200 mg via ORAL
  Filled 2015-04-11 (×2): qty 10

## 2015-04-11 MED ORDER — GABAPENTIN 300 MG PO CAPS
600.0000 mg | ORAL_CAPSULE | Freq: Every day | ORAL | Status: DC
  Administered 2015-04-11: 600 mg via ORAL
  Filled 2015-04-11: qty 2

## 2015-04-11 MED ORDER — GUAIFENESIN ER 600 MG PO TB12
600.0000 mg | ORAL_TABLET | Freq: Two times a day (BID) | ORAL | Status: DC
  Administered 2015-04-11 – 2015-04-21 (×19): 600 mg via ORAL
  Filled 2015-04-11 (×19): qty 1

## 2015-04-11 NOTE — Progress Notes (Signed)
Maximum assist X 2 OOB to chair. Pt has non productive cough and remains high aspiration risk. Family concerned about his declining energy. Questions answered and suggestion to space family visits, allowing for rest intervals

## 2015-04-11 NOTE — Progress Notes (Addendum)
Patient ID: Darrell Livingston, male   DOB: 1921/04/21, 81 y.o.   MRN: [id number]  [facility] PROGRESS NOTE  Darrell Livingston NWG:[id number] DOB: 12-Nov-1920 DOA: 04/09/2015 PCP: Mathews Argyle, MD   Brief narrative:    81 y.o. male with known COPD, DM type II, LUL cancer, who presented to [facility] 04/09/2015 with main concern of several weeks duration of progressively worsening productive cough of yellow sputum, occasionally blood tinged, dyspnea with exertion and at rest, associated with subjective fevers, chills, malaise, poor oral intake. Pt was placed on oxygen via [place] by PCP and was referred to ED for further evaluation.   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 The patient was in routine follow-up at the [facility] in [place]. Recent CT imaging on 03/01/2015 demonstrated 2 left upper lung findings. A 7 mm left cavitary upper lung nodule increased to 1.5 cm with a new solid 1.1 cm cavitating left upper lung lesion. These 2 lesions were felt to represent neoplasms the patient is currently been referred closer to home for potential management of synchronous stage I left upper lung cancers.  PREVIOUS RADIATION THERAPY: Yes SBRT completed in March 2010 to the right upper lobe. Total dose of 50 Gy in 5 fractions. SBRT completed in July 2010 to the right middle lobe. Total dose of 54 Gy in 3 fractions  In ED, VS notable for T 105F, RR up to 29 and HR up to 116, oxygen saturation 84% on RA, blood work notable for WBC 12.9. CXR notable for bilateral lower lobe PNA. TRH asked to admit to SDU for further evaluation and management.   Assessment/Plan:    Principal  Problem:   Sepsis secondary to HCAP (healthcare-associated pneumonia), bilateral lower lobes - unknown pathogen at this time - continue vancomycin and zosyn day #3/7 for now and narrow down as quickly as possible - follow up on urine legionella and strep pneumo, sputum and blood culture - pt remains afebrile in the past 24 hours, WBC now WNL  - ambulate and move OOB to chair, check oxygen saturation with ambulation  - repeat CBC in AM to follow up on WBC - repeat CXR as needed   Active Problems:   Acute hypoxic respiratory failure - secondary to HCAP imposed on known COPD - still requiring oxygen and may need to continue even upon discharge  - treat HCAP as noted above - provide BD's scheduled and as needed     COPD (chronic obstructive pulmonary disease) - no wheezing on exam this AM - BD as noted above, scheduled and as needed  - add mucinex and scheduled and robitussin syrup as needed     Moderate PCM - nutritionist consulted     DM type 2 (diabetes mellitus, type 2) - reasonable inpatient control     Hypertension - reasonable inpatient control     Cancer of upper lobe of left lung - will notify Dr. Tammi Klippel that pt was admitted     Obesity  - Body mass index is 31.5 kg/(m^2).  DVT prophylaxis - Heparin SQ  Code Status: Full.  Family Communication:  plan of care discussed with the patient Disposition Plan: Home when stable.  IV access:  Peripheral IV  Procedures and diagnostic studies:    Dg Chest 2 View 04/09/2015 Focal confluent areas of infiltration or scarring in the right mid lung and right upper lung as well as left lower lung. Emphysematous changes and interstitial fibrosis. Possible superimposed developing infiltration in the left lung base posteriorly.  Dg Chest 2 View 04/08/2015 Confluent airspace opacities within the right mid to upper lung regions correspond to consolidations versus chronic confluent scarring/fibrosis identified on the recent outside  chest CT (favor chronic confluent scarring/fibrosis).   Ct Angio Chest Pe W/cm &/or Wo Cm 04/09/2015  No pulmonary embolus. 2. New bilateral lower lobe patchy and slightly nodular opacities dependently. This may reflect pneumonia or aspiration. Neoplasm is not entirely excluded, however is thought less likely given development over 5 weeks. 3. Chronic opacities in the lingula, right middle and right upper lobe dating back to 2012, likely scarring. 4. Left upper lobe nodules, 1 of which anteriorly has increased in size currently measuring 10 mm. Small 34m cavitary nodule is unchanged. Findings are suspicious for malignancy. 5. Small bilateral pleural effusions, increased from prior. Mild increase in mediastinal lymph nodes, may be reactive or neoplastic, reactive being favored.    Medical Consultants:  None  Other Consultants:  PT Nutritionist   IAnti-Infectives:   Vancomycin 8/26 --> Zosyn 8/26 -->  MFaye Ramsay MD  TGeorg[facility] Retina Surgery Center LLCPager [phone] If 7PM-7AM, please contact night-coverage [email or web address] Password [email or web address] 04/11/2015, 1:30 PM   LOS: 2 days   HPI/Subjective: No events overnight. Still with cough and exertional dyspnea, difficulty clearing secretions. Eating breakfast, sitting in chair.  Objective: Filed Vitals:   04/11/15 0700 04/11/15 0912 04/11/15 0913 04/11/15 1200  BP: 152/65   119/68  Pulse: 104     Temp: 98.2 F (36.8 C)   98.3 F (36.8 C)  TempSrc: Oral   Oral  Resp: 28   33  Height:      Weight:      SpO2: 94% 100% 100% 96%    Intake/Output Summary (Last 24 hours) at 04/11/15 1330 Last data filed at 04/11/15 1300  Gross per 24 hour  Intake   3010 ml  Output    475 ml  Net   2535 ml    Exam:   General:  Pt is alert,appears tired and chronically ill, coughing yellow sputum   Cardiovascular: Regular rate and rhythm, no rubs, no gallops  Respiratory: Bibasilar rhonchi, diminished breath sounds at bases   Abdomen: Soft, non tender, non distended,  bowel sounds present, no guarding  Extremities: No edema, pulses DP and PT palpable bilaterally  Data Reviewed: Basic Metabolic Panel:  Recent Labs Lab 04/09/15 1636 04/11/15 0616  NA 137 138  K 4.3 4.4  CL 101 97*  CO2 29 33*  GLUCOSE 104* 104*  BUN 22* 15  CREATININE 0.94 0.88  CALCIUM 9.3 8.6*   CBC:  Recent Labs Lab 04/09/15 1636 04/11/15 0616  WBC 12.9* 9.7  HGB 14.1 13.9  HCT 44.2 45.2  MCV 94.8 99.1  PLT 176 160   Recent Results (from the past 240 hour(s))  Blood Culture (routine x 2)     Status: None (Preliminary result)   Collection Time: 04/09/15  7:49 PM  Result Value Ref Range Status   Specimen Description BLOOD RIGHT ARM  Final   Special Requests BOTTLES DRAWN AEROBIC AND ANAEROBIC 5CC  Final   Culture NO GROWTH < 24 HOURS  Final   Report Status PENDING  Incomplete  Blood  Culture (routine x 2)     Status: None (Preliminary result)   Collection Time: 04/09/15  7:56 PM  Result Value Ref Range Status   Specimen Description BLOOD RIGHT HAND  Final   Special Requests BOTTLES DRAWN AEROBIC ONLY 3CC  Final   Culture NO GROWTH < 24 HOURS  Final   Report Status PENDING  Incomplete  Urine culture     Status: None (Preliminary result)   Collection Time: 04/09/15  8:27 PM  Result Value Ref Range Status   Specimen Description URINE, CLEAN CATCH  Final   Special Requests Normal  Final   Culture NO GROWTH < 24 HOURS  Final   Report Status PENDING  Incomplete  Culture, blood (routine x 2) Call MD if unable to obtain prior to antibiotics being given     Status: None (Preliminary result)   Collection Time: 04/09/15 11:55 PM  Result Value Ref Range Status   Specimen Description BLOOD LEFT HAND  Final   Special Requests BOTTLES DRAWN AEROBIC AND ANAEROBIC 5CC  Final   Culture PENDING  Incomplete   Report Status PENDING  Incomplete  MRSA PCR Screening     Status: None   Collection Time: 04/10/15 12:09 AM  Result Value Ref Range Status   MRSA by PCR NEGATIVE  NEGATIVE Final   Scheduled Meds: . atorvastatin  10 mg Oral q1800  . budesonide-formoterol  2 puff Inhalation BID  . gabapentin  600 mg Oral QHS  . heparin  5,000 Units Subcutaneous 3 times per day  . insulin aspart  0-9 Units Subcutaneous TID WC  . latanoprost  1 drop Both Eyes QHS  . lisinopril  2.5 mg Oral Daily  . ZOSYN  IV  3.375 g Intravenous Q8H  . tiotropium  18 mcg Inhalation Daily  . triamcinolone  1 application Topical B1694  . vancomycin  750 mg Intravenous Q12H   Continuous Infusions:

## 2015-04-11 NOTE — Progress Notes (Signed)
RN ambulated pt up and down the hall one lap before bed at 2200.  RN placed pt on venti mask because O2 sats did not read well while ambulating. Pt walked and pushed a wheel chair in front of him.  Oxygen remained above 90%, pt did get SOB a few times and paused to catch his breath.  Overall pt tolerated very well and walked with a one assist and some verbal coaching.

## 2015-04-12 ENCOUNTER — Inpatient Hospital Stay (HOSPITAL_COMMUNITY): Payer: Medicare Other

## 2015-04-12 LAB — GLUCOSE, CAPILLARY
GLUCOSE-CAPILLARY: 107 mg/dL — AB (ref 65–99)
GLUCOSE-CAPILLARY: 113 mg/dL — AB (ref 65–99)
GLUCOSE-CAPILLARY: 123 mg/dL — AB (ref 65–99)
GLUCOSE-CAPILLARY: 95 mg/dL (ref 65–99)
GLUCOSE-CAPILLARY: 99 mg/dL (ref 65–99)

## 2015-04-12 LAB — LEGIONELLA ANTIGEN, URINE

## 2015-04-12 LAB — BASIC METABOLIC PANEL
ANION GAP: 7 (ref 5–15)
BUN: 12 mg/dL (ref 6–20)
CALCIUM: 8.8 mg/dL — AB (ref 8.9–10.3)
CO2: 33 mmol/L — ABNORMAL HIGH (ref 22–32)
CREATININE: 0.81 mg/dL (ref 0.61–1.24)
Chloride: 98 mmol/L — ABNORMAL LOW (ref 101–111)
GFR calc Af Amer: >60 mL/min (ref >60)
GLUCOSE: 122 mg/dL — AB (ref 65–99)
Potassium: 4.4 mmol/L (ref 3.5–5.1)
Sodium: 138 mmol/L (ref 135–145)

## 2015-04-12 LAB — BLOOD GAS, ARTERIAL
ACID-BASE EXCESS: 8.1 mmol/L — AB (ref 0.0–2.0)
ACID-BASE EXCESS: 6.7 mmol/L — AB (ref 0.0–2.0)
BICARBONATE: 37.1 meq/L — AB (ref 20.0–24.0)
BICARBONATE: 34 meq/L — AB (ref 20.0–24.0)
DELIVERY SYSTEMS: POSITIVE
DRAWN BY: 280981
DRAWN BY: 280981
Expiratory PAP: 8
FIO2: 0.4
Inspiratory PAP: 16
LHR: 12 {breaths}/min
O2 Content: 2 L/min
O2 SAT: 96.7 %
O2 Saturation: 93.8 %
PATIENT TEMPERATURE: 98.6
Patient temperature: 98.6
TCO2: 40.6 mmol/L (ref 0–100)
TCO2: 36.6 mmol/L (ref 0–100)
pCO2 arterial: 84.3 mmHg (ref 35.0–45.0)
pH, Arterial: 7.131 — CL (ref 7.350–7.450)
pH, Arterial: 7.229 — ABNORMAL LOW (ref 7.350–7.450)
pO2, Arterial: 93.9 mmHg (ref 80.0–100.0)
pO2, Arterial: 72.3 mmHg — ABNORMAL LOW (ref 80.0–100.0)

## 2015-04-12 LAB — CBC
HCT: 45.4 % (ref 39.0–52.0)
Hemoglobin: 13.9 g/dL (ref 13.0–17.0)
MCH: 30.5 pg (ref 26.0–34.0)
MCHC: 30.6 g/dL (ref 30.0–36.0)
MCV: 99.6 fL (ref 78.0–100.0)
PLATELETS: 181 10*3/uL (ref 150–400)
RBC: 4.56 MIL/uL (ref 4.22–5.81)
RDW: 16.1 % — AB (ref 11.5–15.5)
WBC: 8.9 10*3/uL (ref 4.0–10.5)

## 2015-04-12 LAB — URINE CULTURE: SPECIAL REQUESTS: NORMAL

## 2015-04-12 MED ORDER — ENSURE ENLIVE PO LIQD
237.0000 mL | Freq: Two times a day (BID) | ORAL | Status: DC
  Administered 2015-04-14 – 2015-04-21 (×13): 237 mL via ORAL

## 2015-04-12 MED ORDER — MORPHINE SULFATE (PF) 2 MG/ML IV SOLN: 1.0000 mg | INTRAVENOUS | Status: DC | PRN

## 2015-04-12 NOTE — Evaluation (Addendum)
Physical Therapy Evaluation Patient Details Name: Darrell Livingston MRN: [id number] DOB: 1920/10/01 Today's Date: 04/12/2015   History of Present Illness  Darrell Livingston is a 81 y.o. male who presents to the ED with c/o SOB and cough. Symptoms have onset insidiously over the past couple of weeks with worsening. Patient has h/o lung CA, very small tumor burden with LUL nodules that is being managed by oncology . Sepsis with HCAP  Clinical Impression  Mr.Curless with eyes open, blank stare and flat affect on arrival. Pt stated "hey" on greeting but no other verbalization throughout session. Pt was total assist to transfer to EOB, not able to manage secretions, not following commands, minimal movement of 2/5 bil grip strength and no AROM of bil LE. Pt was living alone using a cane prior to admission with help of children for various household chores. Pt currently with limited ability to participate with therapy based on cognition. Pt will benefit from acute therapy to maximize strength, balance, cognition and function to decrease burden of care. RN Justice Rocher and Dr.Mcclung made aware of all above    Follow Up Recommendations SNF;Supervision/Assistance - 24 hour    Equipment Recommendations  Other (comment) (TBD with pt progression)    Recommendations for Other Services OT consult;Speech consult     Precautions / Restrictions Precautions Precautions: Fall      Mobility  Bed Mobility Overal bed mobility: Needs Assistance;+2 for physical assistance Bed Mobility: Supine to Sit;Sit to Supine     Supine to sit: Total assist;HOB elevated Sit to supine: Total assist   General bed mobility comments: total assist with use of pad and HOB 30degrees to pivot pt to EOB. Return to bed total assist. Total assist to scoot to [facility] in reverse trendelenburg  Transfers Overall transfer level:  (unable with 1 person assist)                  Ambulation/Gait                Stairs             Wheelchair Mobility    Modified Rankin (Stroke Patients Only) Modified Rankin (Stroke Patients Only) Pre-Morbid Rankin Score: No significant disability Modified Rankin: Severe disability     Balance Overall balance assessment: Needs assistance;History of Falls   Sitting balance-Leahy Scale: Zero Sitting balance - Comments: pt with flexed trunk, posterior lean and mod-max assist to maintain balance EOB Postural control: Posterior lean;Right lateral lean                                   Pertinent Vitals/Pain Pain Assessment: Faces Pain Score: 0-No pain  98% on 23L 97 HR    Home Living Family/patient expects to be discharged to:: Private residence Living Arrangements: Children Available Help at Discharge: Available PRN/intermittently Type of Home: House Home Access: Stairs to enter   [employer] of Steps: 2 Home Layout: Two level;Able to live on main level with bedroom/bathroom Home Equipment: Cane - quad      Prior Function Level of Independence: Independent with assistive device(s)         Comments: pt was living alone, using cane, driving to the grocery store, son spent the night approximately half the time. Pt at baseline has slow speech and report of several recent falls. Performing ADLs on his own     Hand Dominance  Extremity/Trunk Assessment   Upper Extremity Assessment: RUE deficits/detail;LUE deficits/detail RUE Deficits / Details: minimal grip 2/5, attempting to assist with pushing trunk off surface 2/5 no other active ROM     LUE Deficits / Details: minimal grip 2/5, attempting to assist with pushing trunk off surface 2/5 no other active ROM   Lower Extremity Assessment: RLE deficits/detail;LLE deficits/detail RLE Deficits / Details: no AROM throughout session despite cues, assist and transfers performed LLE Deficits / Details: no AROM throughout session despite cues, assist and transfers performed      Communication   Communication: HOH  Cognition Arousal/Alertness: Awake/alert Behavior During Therapy: Flat affect Overall Cognitive Status: Difficult to assess (pt only responding "hey" no other verbal response)                      General Comments      Exercises        Assessment/Plan    PT Assessment Patient needs continued PT services  PT Diagnosis Difficulty walking;Abnormality of gait;Generalized weakness;Altered mental status   PT Problem List Decreased strength;Decreased range of motion;Decreased activity tolerance;Decreased balance;Decreased mobility;Decreased coordination;Decreased safety awareness;Decreased knowledge of use of DME;Decreased cognition;Obesity  PT Treatment Interventions DME instruction;Gait training;Functional mobility training;Therapeutic activities;Therapeutic exercise;Balance training;Patient/family education;Neuromuscular re-education;Cognitive remediation   PT Goals (Current goals can be found in the Care Plan section) Acute Rehab PT Goals Patient Stated Goal: return home PT Goal Formulation: With family Time For Goal Achievement: 04/26/15 Potential to Achieve Goals: Fair    Frequency Min 3X/week   Barriers to discharge Decreased caregiver support      Co-evaluation               End of Session   Activity Tolerance: Patient tolerated treatment well Patient left: in bed;with call bell/phone within reach;with bed alarm set Nurse Communication: Mobility status;Precautions;Need for lift equipment         Time: 8675-4492 PT Time Calculation (min) (ACUTE ONLY): 15 min   Charges:   PT Evaluation $Initial PT Evaluation Tier I: 1 Procedure     PT G CodesMelford Aase 04/12/2015, 9:21 AM Elwyn Reach, [place]

## 2015-04-12 NOTE — Progress Notes (Signed)
04/12/2015 patient was not responding, very sluggish and weak. It was reported that patient was more alert 2 day ago, last yesterday and last night he was not responding. Dr Thereasa Solo was made aware of patient condition and order was given for EKG, blood gas and portable chest x-ray. [facility] RN.

## 2015-04-12 NOTE — Progress Notes (Signed)
Pt is less responsive and does not follow verbal commands. Pt is arousable and all VS have remained the same.  Gradual changes in pts neuro status and orientation have been communicated to MD and day shift MD's will evaluate. RN will continue to monitor.

## 2015-04-12 NOTE — Care Management Important Message (Signed)
Important Message  Patient Details  Name: Darrell Livingston MRN: [id number] Date of Birth: [birth year]   Medicare Important Message Given:  Yes-second notification given    Pricilla Handler 04/12/2015, 3:09 PM

## 2015-04-12 NOTE — Clinical Documentation Improvement (Signed)
Hospitalist  Can the diagnosis of CHF be further specified?    Acuity - Acute, Chronic, Acute on Chronic   Type - Systolic, Diastolic, Systolic and Diastolic  Other  Clinically Undetermined   Document any associated diagnoses/conditions   Supporting Information: Stated in record that pt is newly diagnosed with heart failure.  CXR reveals bilateral plerural effusions.   Please exercise your independent, professional judgment when responding. A specific answer is not anticipated or expected.   Thank You,  [place]

## 2015-04-12 NOTE — Progress Notes (Signed)
Initial Nutrition Assessment  DOCUMENTATION CODES:   Obesity unspecified  INTERVENTION:   Ensure Enlive po BID, each supplement provides 350 kcal and 20 grams of protein  NUTRITION DIAGNOSIS:   Increased nutrient needs related to chronic illness as evidenced by estimated needs  GOAL:   Patient will meet greater than or equal to 90% of their needs  MONITOR:   PO intake, Supplement acceptance, Labs, Weight trends, I & O's  REASON FOR ASSESSMENT:   Consult Assessment of nutrition requirement/status  ASSESSMENT:   81 y.o. Male who presents to the ED with c/o SOB and cough. Symptoms have onset insidiously over the past couple of weeks. Have been worsening. Got put on O2 by his PCP yesterday and sent in to hospital today as these continued to worsen. Patient has h/o lung CA, very small tumor burden with LUL nodules that is being managed by oncology.  Pt currently on BiPAP.  Unable to obtain nutrition hx.  Pt's fiance's friend at bedside.  PO intake has been very poor at 15% per flowsheet records.  Will add oral nutrition supplements to help meet kcal, protein needs.  RD unable to complete Nutrition Focused Physical Exam at this time.  Diet Order:  DIET DYS 3 Room service appropriate?: Yes; Fluid consistency:: Thin  Skin:  Reviewed, no issues  Last BM:  8/27  Height:   Ht Readings from Last 1 Encounters:  04/10/15 '5\' 9"'$  (1.753 m)    Weight:   Wt Readings from Last 1 Encounters:  04/11/15 213 lb 6.5 oz (96.8 kg)    Ideal Body Weight:  73 kg  BMI:  Body mass index is 31.5 kg/(m^2).  Estimated Nutritional Needs:   Kcal:  1650-1850  Protein:  80-90 gm  Fluid:  1.6-1.8 L  EDUCATION NEEDS:   No education needs identified at this time  Arthur Holms, RD, LDN Pager #: [phone] After-Hours Pager #: [phone]

## 2015-04-12 NOTE — Plan of Care (Signed)
Problem: Phase I Progression Outcomes Goal: Progress activity as tolerated unless otherwise ordered Outcome: Progressing Pt ambulated very well on 8/28 with this RN.  Today pt appears extremely tired and weak so RN did not ambulate.

## 2015-04-12 NOTE — Progress Notes (Addendum)
[place] TEAM 1 - Stepdown/ICU TEAM Progress Note  Darrell Livingston BJY:[id number] DOB: 1920/09/11 DOA: 04/09/2015 PCP: Mathews Argyle, MD  Admit HPI / Brief Narrative: 81 y.o. male with hx of COPD, DM type II, and LUL lung cancer who presented to [facility] 04/09/2015 with main concern of several weeks duration of progressively worsening productive cough of yellow sputum, occasionally blood tinged, dyspnea with exertion and at rest, subjective fevers, chills, malaise, and poor oral intake.   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 He was subsequently diagnosed with a right middle lung non-small cell carcinoma in May 2010 which was also treated with stereotactic body radiotherapy in July 2010. The patient obtains routine follow-up at the [facility] in [place]. CT imaging on 03/01/2015 demonstrated 2 left upper lung findings. A 7 mm cavitary upper lung nodule increased to 1.5 cm with a new solid 1.1 cm cavitating left upper lung lesion. These 2 lesions were felt to represent neoplasms and the patient is currently been referred closer to home for potential management of synchronous stage I left upper lung cancers.  HPI/Subjective: The patient is obtunded at the time of visit today.  Per the patient's nurse his mental status has been on a consistent gradual decline over the last 24 hours.  I suspect the patient is suffering with worsening hypercarbic respiratory failure.  A stat chest x-ray and ABG have been requested.  I spoke with 2 of the patient's sons at bedside at length.  They make it clear that the patient desires a no CODE BLUE status and would not wish to be intubated.  I agree this be inappropriate.  They do wish to continue active medical care to include BiPAP for a reasonable period of time to determine if it will assist patient and  recovering.  We have agreed to use BiPAP on a continuous basis until he either wakes up and stabilizes or until Wednesday morning.  Assessment/Plan:  Sepsis secondary to HCAP bilateral lower lobes - continue vancomycin and zosyn   Acute hypoxic respiratory failure - acute hypercarbic respiratory failure - secondary to HCAP imposed on known COPD - an acute decline today likely due to increased stress upon pulmonary system of baseline severe COPD, lung cancer, and now new pneumonia -IR plan is to continue BiPAP and monitor the patient for now - should he prove to decline even on BiPAP we will not hesitate to transition to full comfort care - at this time however the family still desires active care and I feel this is reasonable (short of intubation/vasopressors/CODE BLUE)  COPD  -No clear bronchospastic exacerbation at present but this appears to be contributing to his acute decline simply due to his poor baseline pulmonary function  Moderate PCM - nutritionist consulted   DM type 2 - CBG currently well-controlled  Hypertension - Blood pressure reasonably controlled at present time  Cancer of upper lobe of left lung  Obesity  - Body mass index is 31.5 kg/(m^2).  Code Status: NO CODE BLUE Family Communication: Spoke with 2 sons at bedside at length Disposition Plan: SDU  Consultants: Rad Onc  Procedures: none  Antibiotics: Vancomycin 8/26 > Zosyn 8/26 >  DVT prophylaxis: SQ heparin   Objective: Blood pressure 136/95, pulse 96, temperature 99.1 F (37.3 C), temperature source Oral, resp. rate 29, height '5\' 9"'$  (1.753 m), weight 96.8 kg (  213 lb 6.5 oz), SpO2 98 %.  Intake/Output Summary (Last 24 hours) at 04/12/15 0852 Last data filed at 04/12/15 0400  Gross per 24 hour  Intake   1335 ml  Output      0 ml  Net   1335 ml   Exam: General: Significantly increased work of breathing with tachypnea and shallow respirations Lungs: Very poor air movement throughout all  fields with no active wheeze Cardiovascular: Regular rate and rhythm without murmur gallop or rub  Abdomen: Nontender, nondistended, soft, bowel sounds positive, no rebound, no ascites, no appreciable mass Extremities: No significant cyanosis, clubbing, or edema bilateral lower extremities  Data Reviewed: Basic Metabolic Panel:  Recent Labs Lab 04/09/15 1636 04/11/15 0616 04/12/15 0500  NA 137 138 138  K 4.3 4.4 4.4  CL 101 97* 98*  CO2 29 33* 33*  GLUCOSE 104* 104* 122*  BUN 22* 15 12  CREATININE 0.94 0.88 0.81  CALCIUM 9.3 8.6* 8.8*    CBC:  Recent Labs Lab 04/09/15 1636 04/11/15 0616 04/12/15 0500  WBC 12.9* 9.7 8.9  HGB 14.1 13.9 13.9  HCT 44.2 45.2 45.4  MCV 94.8 99.1 99.6  PLT 176 160 181    Liver Function Tests: No results for input(s): AST, ALT, ALKPHOS, BILITOT, PROT, ALBUMIN in the last 168 hours. No results for input(s): LIPASE, AMYLASE in the last 168 hours. No results for input(s): AMMONIA in the last 168 hours.  CBG:  Recent Labs Lab 04/11/15 0804 04/11/15 1236 04/11/15 1622 04/11/15 2113 04/12/15 0735  GLUCAP 96 133* 95 125* 123*    Recent Results (from the past 240 hour(s))  Blood Culture (routine x 2)     Status: None (Preliminary result)   Collection Time: 04/09/15  7:49 PM  Result Value Ref Range Status   Specimen Description BLOOD RIGHT ARM  Final   Special Requests BOTTLES DRAWN AEROBIC AND ANAEROBIC 5CC  Final   Culture NO GROWTH 2 DAYS  Final   Report Status PENDING  Incomplete  Blood Culture (routine x 2)     Status: None (Preliminary result)   Collection Time: 04/09/15  7:56 PM  Result Value Ref Range Status   Specimen Description BLOOD RIGHT HAND  Final   Special Requests BOTTLES DRAWN AEROBIC ONLY 3CC  Final   Culture NO GROWTH 2 DAYS  Final   Report Status PENDING  Incomplete  Urine culture     Status: None (Preliminary result)   Collection Time: 04/09/15  8:27 PM  Result Value Ref Range Status   Specimen Description  URINE, CLEAN CATCH  Final   Special Requests Normal  Final   Culture CULTURE REINCUBATED FOR BETTER GROWTH  Final   Report Status PENDING  Incomplete  Culture, blood (routine x 2) Call MD if unable to obtain prior to antibiotics being given     Status: None (Preliminary result)   Collection Time: 04/09/15 11:55 PM  Result Value Ref Range Status   Specimen Description BLOOD LEFT HAND  Final   Special Requests BOTTLES DRAWN AEROBIC AND ANAEROBIC 5CC  Final   Culture NO GROWTH 1 DAY  Final   Report Status PENDING  Incomplete  Culture, blood (routine x 2) Call MD if unable to obtain prior to antibiotics being given     Status: None (Preliminary result)   Collection Time: 04/10/15 12:01 AM  Result Value Ref Range Status   Specimen Description BLOOD RIGHT HAND  Final   Special Requests BOTTLES DRAWN AEROBIC AND ANAEROBIC 5CC  Final   Culture NO GROWTH 1 DAY  Final   Report Status PENDING  Incomplete  MRSA PCR Screening     Status: None   Collection Time: 04/10/15 12:09 AM  Result Value Ref Range Status   MRSA by PCR NEGATIVE NEGATIVE Final    Comment:        The GeneXpert MRSA Assay (FDA approved for NASAL specimens only), is one component of a comprehensive MRSA colonization surveillance program. It is not intended to diagnose MRSA infection nor to guide or monitor treatment for MRSA infections.      Studies:   Recent x-ray studies have been reviewed in detail by the Attending Physician  Scheduled Meds:  Scheduled Meds: . atorvastatin  10 mg Oral q1800  . budesonide-formoterol  2 puff Inhalation BID  . cholecalciferol  1,000 Units Oral Daily  . gabapentin  600 mg Oral QHS  . guaiFENesin  600 mg Oral BID  . heparin  5,000 Units Subcutaneous 3 times per day  . insulin aspart  0-9 Units Subcutaneous TID WC  . latanoprost  1 drop Both Eyes QHS  . lisinopril  2.5 mg Oral Daily  . piperacillin-tazobactam (ZOSYN)  IV  3.375 g Intravenous Q8H  . tiotropium  18 mcg Inhalation  Daily  . triamcinolone  1 application Topical [id number]  . vancomycin  750 mg Intravenous Q12H    Time spent on care of this patient: 35 mins   MCCLUNG,JEFFREY T , MD   [facility] Office  [phone] Pager - Text Page per Shea Evans as per below:  On-Call/Text Page:      [email or web address]      password [email or web address]  If 7PM-7AM, please contact night-coverage [email or web address] Password [email or web address] 04/12/2015, 8:52 AM   LOS: 3 days

## 2015-04-12 NOTE — Progress Notes (Addendum)
CRITICAL VALUE ALERT  Critical value received:  Ph (7.131), CO2 (above reportable range)  Date of notification:  04/12/2015  Time of notification:  0925  Critical value read back:Yes.    Nurse who received alert:  Rico Sheehan RN  MD notified (1st page):  Dr Thereasa Solo  Time of first page:  0927  MD notified (2nd page):  Time of second page:  Responding MD:  Dr Thereasa Solo  Time MD responded:  [phone]

## 2015-04-12 NOTE — Progress Notes (Signed)
Yesterday pt was AOx4 and very communicative.  Pt was also continent of bowel and bladder. Today pt has been slow to answer questions and disoriented to place and incontinent of urine.  VS are all WNL. RN will continue to monitor.

## 2015-04-12 NOTE — Progress Notes (Signed)
CRITICAL VALUE ALERT  Critical value received:  Ph (7.229  Date of notification:  04/12/2015  Time of notification:  2947  Critical value read back:Yes.    Nurse who received alert:  Rico Sheehan RN  MD notified (1st page):  Dr Thereasa Solo  Time of first page:  62  MD notified (2nd page):  Time of second page:  Responding MD:  Dr Thereasa Solo  Time MD responded:  [phone]

## 2015-04-12 NOTE — Progress Notes (Signed)
Pt placed on NIV per MD order due to ABG results.  Pt tolerating well at this time.  Rt will monitor.

## 2015-04-13 ENCOUNTER — Inpatient Hospital Stay (HOSPITAL_COMMUNITY): Payer: Medicare Other

## 2015-04-13 ENCOUNTER — Telehealth: Payer: Self-pay | Admitting: Radiation Oncology

## 2015-04-13 DIAGNOSIS — E118 Type 2 diabetes mellitus with unspecified complications: Secondary | ICD-10-CM

## 2015-04-13 DIAGNOSIS — J9602 Acute respiratory failure with hypercapnia: Secondary | ICD-10-CM

## 2015-04-13 DIAGNOSIS — A419 Sepsis, unspecified organism: Principal | ICD-10-CM

## 2015-04-13 DIAGNOSIS — J189 Pneumonia, unspecified organism: Secondary | ICD-10-CM

## 2015-04-13 DIAGNOSIS — I10 Essential (primary) hypertension: Secondary | ICD-10-CM | POA: Diagnosis present

## 2015-04-13 DIAGNOSIS — C3492 Malignant neoplasm of unspecified part of left bronchus or lung: Secondary | ICD-10-CM

## 2015-04-13 DIAGNOSIS — J9601 Acute respiratory failure with hypoxia: Secondary | ICD-10-CM | POA: Diagnosis present

## 2015-04-13 DIAGNOSIS — J441 Chronic obstructive pulmonary disease with (acute) exacerbation: Secondary | ICD-10-CM

## 2015-04-13 LAB — COMPREHENSIVE METABOLIC PANEL
ALK PHOS: 79 U/L (ref 38–126)
ALT: 28 U/L (ref 17–63)
ANION GAP: 7 (ref 5–15)
AST: 23 U/L (ref 15–41)
Albumin: 2.4 g/dL — ABNORMAL LOW (ref 3.5–5.0)
BUN: 17 mg/dL (ref 6–20)
CALCIUM: 8.5 mg/dL — AB (ref 8.9–10.3)
CO2: 35 mmol/L — AB (ref 22–32)
Chloride: 95 mmol/L — ABNORMAL LOW (ref 101–111)
Creatinine, Ser: 0.83 mg/dL (ref 0.61–1.24)
GFR calc non Af Amer: >60 mL/min (ref >60)
Glucose, Bld: 116 mg/dL — ABNORMAL HIGH (ref 65–99)
Potassium: 4.3 mmol/L (ref 3.5–5.1)
SODIUM: 137 mmol/L (ref 135–145)
Total Bilirubin: 1.2 mg/dL (ref 0.3–1.2)
Total Protein: 6.7 g/dL (ref 6.5–8.1)

## 2015-04-13 LAB — BLOOD GAS, ARTERIAL
ACID-BASE EXCESS: 8.1 mmol/L — AB (ref 0.0–2.0)
ACID-BASE EXCESS: 10.4 mmol/L — AB (ref 0.0–2.0)
BICARBONATE: 34.4 meq/L — AB (ref 20.0–24.0)
Bicarbonate: 35.6 mEq/L — ABNORMAL HIGH (ref 20.0–24.0)
DRAWN BY: 405301
DRAWN BY: 276051
Delivery systems: POSITIVE
Expiratory PAP: 8
FIO2: 0.4
Inspiratory PAP: 16
LHR: 12 {breaths}/min
O2 Content: 3 L/min
O2 SAT: 95.7 %
O2 SAT: 99.2 %
PATIENT TEMPERATURE: 98.6
PCO2 ART: 72.2 mmHg — AB (ref 35.0–45.0)
PH ART: 7.299 — AB (ref 7.350–7.450)
PO2 ART: 115 mmHg — AB (ref 80.0–100.0)
Patient temperature: 98.6
TCO2: 36.6 mmol/L (ref 0–100)
TCO2: 37.5 mmol/L (ref 0–100)
pCO2 arterial: 60.2 mmHg (ref 35.0–45.0)
pH, Arterial: 7.39 (ref 7.350–7.450)
pO2, Arterial: 81 mmHg (ref 80.0–100.0)

## 2015-04-13 LAB — LIPID PANEL
Cholesterol: 104 mg/dL (ref 0–200)
HDL: 33 mg/dL — AB (ref >40)
LDL CALC: 58 mg/dL (ref 0–99)
Total CHOL/HDL Ratio: 3.2 RATIO
Triglycerides: 65 mg/dL (ref <150)
VLDL: 13 mg/dL (ref 0–40)

## 2015-04-13 LAB — GRAM STAIN

## 2015-04-13 LAB — CBC
HCT: 45.1 % (ref 39.0–52.0)
Hemoglobin: 13.5 g/dL (ref 13.0–17.0)
MCH: 29.9 pg (ref 26.0–34.0)
MCHC: 29.9 g/dL — ABNORMAL LOW (ref 30.0–36.0)
MCV: 100 fL (ref 78.0–100.0)
PLATELETS: 174 10*3/uL (ref 150–400)
RBC: 4.51 MIL/uL (ref 4.22–5.81)
RDW: 15.7 % — ABNORMAL HIGH (ref 11.5–15.5)
WBC: 8.2 10*3/uL (ref 4.0–10.5)

## 2015-04-13 LAB — EXPECTORATED SPUTUM ASSESSMENT W GRAM STAIN, RFLX TO RESP C

## 2015-04-13 LAB — GLUCOSE, CAPILLARY
GLUCOSE-CAPILLARY: 125 mg/dL — AB (ref 65–99)
GLUCOSE-CAPILLARY: 114 mg/dL — AB (ref 65–99)
GLUCOSE-CAPILLARY: 151 mg/dL — AB (ref 65–99)
Glucose-Capillary: 118 mg/dL — ABNORMAL HIGH (ref 65–99)
Glucose-Capillary: 104 mg/dL — ABNORMAL HIGH (ref 65–99)

## 2015-04-13 LAB — EXPECTORATED SPUTUM ASSESSMENT W REFEX TO RESP CULTURE

## 2015-04-13 LAB — PROCALCITONIN: Procalcitonin: 0.12 ng/mL

## 2015-04-13 LAB — VANCOMYCIN, TROUGH: VANCOMYCIN TR: 14 ug/mL (ref 10.0–20.0)

## 2015-04-13 MED ORDER — IPRATROPIUM-ALBUTEROL 0.5-2.5 (3) MG/3ML IN SOLN
RESPIRATORY_TRACT | Status: AC
  Filled 2015-04-13: qty 3

## 2015-04-13 MED ORDER — IPRATROPIUM-ALBUTEROL 0.5-2.5 (3) MG/3ML IN SOLN
3.0000 mL | Freq: Four times a day (QID) | RESPIRATORY_TRACT | Status: DC
  Administered 2015-04-13 – 2015-04-14 (×6): 3 mL via RESPIRATORY_TRACT
  Filled 2015-04-13 (×5): qty 3

## 2015-04-13 MED ORDER — METHYLPREDNISOLONE SODIUM SUCC 125 MG IJ SOLR
60.0000 mg | INTRAMUSCULAR | Status: AC
  Administered 2015-04-13 – 2015-04-18 (×6): 60 mg via INTRAVENOUS
  Filled 2015-04-13 (×6): qty 2

## 2015-04-13 MED ORDER — SODIUM CHLORIDE 3 % IN NEBU
15.0000 mL | INHALATION_SOLUTION | Freq: Every day | RESPIRATORY_TRACT | Status: DC
  Filled 2015-04-13 (×2): qty 15

## 2015-04-13 NOTE — Clinical Social Work Note (Signed)
Clinical Social Work Assessment  Patient Details  Name: Darrell Livingston MRN: [id number] Date of Birth: 05/05/1921  Date of referral:  04/13/15               Reason for consult:  Facility Placement                Permission sought to share information with:  Family Supports Permission granted to share information::  No (pt disoriented )  Name::     Darrell Livingston  Agency::  [facility]  Relationship::  son  Contact Information:  [phone]  Housing/Transportation Living arrangements for the past 2 months:  [place] of Information:  Adult Children Patient Interpreter Needed:  None Criminal Activity/Legal Involvement Pertinent to Current Situation/Hospitalization:  No - Comment as needed Significant Relationships:  Adult Children Lives with:  Adult Children Do you feel safe going back to the place where you live?  Yes Need for family participation in patient care:  Yes (Comment)  Care giving concerns:  Pt lives with son but is currently requiring high level of physical assistance and pt 3 son unsure if they will be able to care for pt at home given current needs   Facilities manager / plan:  CSW spoke with pt son about SNF vs home with home health   Employment status:  Retired Nurse, adult PT Recommendations:  [place] / Referral to community resources:  [place]  Patient/Family's Response to care: Pt son states that the pt wife has been to SNF in the past and they are agreeable to sending the pt to SNF for short term rehab is necessary- they want to see how patient progresses but are willing to initiate bed search in case SNF is necessary  Patient/Family's Understanding of and Emotional Response to Diagnosis, Current Treatment, and Prognosis:  Patients sons have no questions or concerns at this time  Emotional Assessment Appearance:  Appears stated age Attitude/Demeanor/Rapport:   Unable to Assess Affect (typically observed):  Unable to Assess Orientation:  Oriented to Self Alcohol / Substance use:  Not Applicable Psych involvement (Current and /or in the community):  No (Comment)  Discharge Needs  Concerns to be addressed:  Care Coordination, Home Safety Concerns Readmission within the last 30 days:  No Current discharge risk:  Physical Impairment Barriers to Discharge:  Continued Medical Work up   [employer], LCSW 04/13/2015, 4:13 PM

## 2015-04-13 NOTE — Progress Notes (Addendum)
[place] TEAM 1 - Stepdown/ICU TEAM Progress Note  Darrell Livingston VOH:[id number] DOB: [birth year] DOA: 04/09/2015 PCP: Mathews Argyle, MD  Admit HPI / Brief Narrative: 81 y.o. PMHx  HTN, DM type II, COPD not O2 dependent, CAD native artery, LUL lung cancer,    presented to [facility] 04/09/2015 with main concern of several weeks duration of progressively worsening productive cough of yellow sputum, occasionally blood tinged, dyspnea with exertion and at rest, subjective fevers, chills, malaise, and poor oral intake.   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 He was subsequently diagnosed with a right middle lung non-small cell carcinoma in May 2010 which was also treated with stereotactic body radiotherapy in July 2010. The patient obtains routine follow-up at the [facility] in [place]. CT imaging on 03/01/2015 demonstrated 2 left upper lung findings. A 7 mm cavitary upper lung nodule increased to 1.5 cm with a new solid 1.1 cm cavitating left upper lung lesion. These 2 lesions were felt to represent neoplasms and the patient is currently been referred closer to home for potential management of synchronous stage I left upper lung cancers.   HPI/Subjective: 8/30 alert, but confused unable to answer questions appropriately, follows commands.. Per son patient's baseline is A/O 4. States it is not O2 dependent chronically. Was acutely O2 dependent starting last Thursday just before the requiring hospitalization. Lives with one of his brothers but is extremely independent and able to perform ADLs. States 6 years ago received XRT for lung cancer on the right side. Dr. Tammi Klippel (oncologist), prior to this acute illness was discussing with patient and family treatment plan for new nodules discovered on LUL.    Assessment/Plan: Sepsis secondary to  HCAP bilateral lower lobes - continue vancomycin and zosyn  -Sputum induction by respiratory -Flutter valve -Solu-Medrol 60 mg daily  Acute hypoxic respiratory failure - acute hypercarbic respiratory failure - secondary to HCAP imposed on known COPD - an acute decline today likely due to increased stress upon pulmonary system of baseline severe COPD, lung cancer, and now new pneumonia -Patient improved from admission but not back to baseline per son. Will try patient on nasal cannula (rest skin)  and obtain ABG~1200  COPD  -No clear bronchospastic exacerbation at present but this appears to be contributing to his acute decline simply due to his poor baseline pulmonary function -Rhonchi especially in left lung fields will try and obtain induced sputum for culture  Moderate PCM - nutritionist consulted   DM type 2 - CBG currently well-controlled -Hemoglobin A1c pending -Lipid panel pending  Hypertension - Blood pressure reasonably controlled at present time  Cancer of upper lobe of left lung -stage IA non-small cell carcinomas of the left upper lung without biopsy    Code Status: DO NOT RESUSCITATE Family Communication: Son present at time of exam Disposition Plan: Resolution HCAP    Consultants: Rad Onc  Procedure/Significant Events:    Culture 8/26 blood right arm/hand NGTD 8/26 urine positive multiple species; recollect 8/26 blood left hand NGTD 8/27 blood right hand NGTD 8/27 MRSA by PCR negative    Antibiotics: Vancomycin 8/26 > Zosyn 8/26 >   DVT prophylaxis: Subcutaneous heparin   Devices    LINES / TUBES:      Continuous Infusions:   Objective: VITAL SIGNS: Temp: 98.3 F (36.8 C) (08/30 0800) Temp Source: Axillary (08/30 0800) BP:  124/87 mmHg (08/30 0600) Pulse Rate: 108 (08/30 0600) SPO2; 95% on BiPAP 16/8, FIO2: 40%   Intake/Output Summary (Last 24 hours) at 04/13/15 1141 Last data filed at 04/13/15 0427  Gross per 24 hour    Intake    250 ml  Output      0 ml  Net    250 ml     Exam: General:alert, but confused unable to answer questions appropriately, follows commands, positive acute respiratory distress Eyes: Negative headache, eye pain, double vision,negative scleral hemorrhage ENT: Negative Runny nose, negative ear pain, negative tinnitus, negative gingival bleeding, Neck:  Negative scars, masses, torticollis, lymphadenopathy, JVD Lungs: diffuse mild bilateral expiratory wheezing, positive rhonchi Lt > Rt  Cardiovascular: Regular rate and rhythm without murmur gallop or rub normal S1 and S2 Abdomen:negative abdominal pain, negative dysphagia, nondistended, positive soft, bowel sounds, no rebound, no ascites, no appreciable mass Extremities: No significant cyanosis, clubbing, or edema bilateral lower extremities Psychiatric:  Unable to assess  Neurologic:  Cranial nerves II through XII intact, tongue/uvula midline, all extremities muscle strength 5/5, sensation intact throughout, negative dysarthria, negative expressive aphasia, negative receptive aphasia.   Data Reviewed: Basic Metabolic Panel:  Recent Labs Lab 04/09/15 1636 04/11/15 0616 04/12/15 0500 04/13/15 0349  NA 137 138 138 137  K 4.3 4.4 4.4 4.3  CL 101 97* 98* 95*  CO2 29 33* 33* 35*  GLUCOSE 104* 104* 122* 116*  BUN 22* '15 12 17  '$ CREATININE 0.94 0.88 0.81 0.83  CALCIUM 9.3 8.6* 8.8* 8.5*   Liver Function Tests:  Recent Labs Lab 04/13/15 0349  AST 23  ALT 28  ALKPHOS 79  BILITOT 1.2  PROT 6.7  ALBUMIN 2.4*   No results for input(s): LIPASE, AMYLASE in the last 168 hours. No results for input(s): AMMONIA in the last 168 hours. CBC:  Recent Labs Lab 04/09/15 1636 04/11/15 0616 04/12/15 0500 04/13/15 0349  WBC 12.9* 9.7 8.9 8.2  HGB 14.1 13.9 13.9 13.5  HCT 44.2 45.2 45.4 45.1  MCV 94.8 99.1 99.6 100.0  PLT 176 160 181 174   Cardiac Enzymes: No results for input(s): CKTOTAL, CKMB, CKMBINDEX, TROPONINI in the  last 168 hours. BNP (last 3 results)  Recent Labs  04/09/15 1949  BNP 436.6*    ProBNP (last 3 results) No results for input(s): PROBNP in the last 8760 hours.  CBG:  Recent Labs Lab 04/12/15 1607 04/12/15 2056 04/12/15 2334 04/13/15 0358 04/13/15 0820  GLUCAP 107* 95 113* 118* 104*    Recent Results (from the past 240 hour(s))  Blood Culture (routine x 2)     Status: None (Preliminary result)   Collection Time: 04/09/15  7:49 PM  Result Value Ref Range Status   Specimen Description BLOOD RIGHT ARM  Final   Special Requests BOTTLES DRAWN AEROBIC AND ANAEROBIC 5CC  Final   Culture NO GROWTH 3 DAYS  Final   Report Status PENDING  Incomplete  Blood Culture (routine x 2)     Status: None (Preliminary result)   Collection Time: 04/09/15  7:56 PM  Result Value Ref Range Status   Specimen Description BLOOD RIGHT HAND  Final   Special Requests BOTTLES DRAWN AEROBIC ONLY 3CC  Final   Culture NO GROWTH 3 DAYS  Final   Report Status PENDING  Incomplete  Urine culture     Status: None   Collection Time: 04/09/15  8:27 PM  Result Value Ref Range Status   Specimen Description URINE, CLEAN CATCH  Final  Special Requests Normal  Final   Culture MULTIPLE SPECIES PRESENT, SUGGEST RECOLLECTION  Final   Report Status 04/12/2015 FINAL  Final  Culture, blood (routine x 2) Call MD if unable to obtain prior to antibiotics being given     Status: None (Preliminary result)   Collection Time: 04/09/15 11:55 PM  Result Value Ref Range Status   Specimen Description BLOOD LEFT HAND  Final   Special Requests BOTTLES DRAWN AEROBIC AND ANAEROBIC 5CC  Final   Culture NO GROWTH 2 DAYS  Final   Report Status PENDING  Incomplete  Culture, blood (routine x 2) Call MD if unable to obtain prior to antibiotics being given     Status: None (Preliminary result)   Collection Time: 04/10/15 12:01 AM  Result Value Ref Range Status   Specimen Description BLOOD RIGHT HAND  Final   Special Requests BOTTLES  DRAWN AEROBIC AND ANAEROBIC 5CC  Final   Culture NO GROWTH 2 DAYS  Final   Report Status PENDING  Incomplete  MRSA PCR Screening     Status: None   Collection Time: 04/10/15 12:09 AM  Result Value Ref Range Status   MRSA by PCR NEGATIVE NEGATIVE Final    Comment:        The GeneXpert MRSA Assay (FDA approved for NASAL specimens only), is one component of a comprehensive MRSA colonization surveillance program. It is not intended to diagnose MRSA infection nor to guide or monitor treatment for MRSA infections.      Studies:  Recent x-ray studies have been reviewed in detail by the Attending Physician  Scheduled Meds:  Scheduled Meds: . budesonide-formoterol  2 puff Inhalation BID  . feeding supplement (ENSURE ENLIVE)  237 mL Oral BID BM  . guaiFENesin  600 mg Oral BID  . heparin  5,000 Units Subcutaneous 3 times per day  . insulin aspart  0-9 Units Subcutaneous TID WC  . ipratropium-albuterol  3 mL Nebulization Q6H  . ipratropium-albuterol      . latanoprost  1 drop Both Eyes QHS  . methylPREDNISolone (SOLU-MEDROL) injection  60 mg Intravenous Q24H  . piperacillin-tazobactam (ZOSYN)  IV  3.375 g Intravenous Q8H  . sodium chloride HYPERTONIC  15 mL Nebulization Daily  . tiotropium  18 mcg Inhalation Daily  . triamcinolone  1 application Topical [id number]  . vancomycin  750 mg Intravenous Q12H    Time spent on care of this patient: 40 mins   Izzac Rockett, Geraldo Docker , MD  [facility] Office  [phone] Pager - [phone]  On-Call/Text Page:      [email or web address]      password [email or web address]  If 7PM-7AM, please contact night-coverage [email or web address] Password [email or web address] 04/13/2015, 11:41 AM   LOS: 4 days   Care during the described time interval was provided by me .  I have reviewed this patient's available data, including medical history, events of note, physical examination, and all test results as part of my evaluation. I have personally reviewed and interpreted all radiology  studies.   Dia Crawford, MD [phone] Pager

## 2015-04-13 NOTE — Progress Notes (Addendum)
ANTIBIOTIC CONSULT NOTE - FOLLOW UP  Pharmacy Consult for Vancomycin Indication: pneumonia  No Known Allergies  Patient Measurements: Height: '5\' 9"'$  (175.3 cm) Weight: 213 lb 6.5 oz (96.8 kg) IBW/kg (Calculated) : 70.7 Adjusted Body Weight:    Vital Signs: Temp: 98.3 F (36.8 C) (08/30 0800) Temp Source: Axillary (08/30 0800) BP: 124/87 mmHg (08/30 0600) Pulse Rate: 108 (08/30 0600) Intake/Output from previous day: 08/29 0701 - 08/30 0700 In: 250 [IV Piggyback:250] Out: -  Intake/Output from this shift:    Labs:  Recent Labs  04/11/15 0616 04/12/15 0500 04/13/15 0349  WBC 9.7 8.9 8.2  HGB 13.9 13.9 13.5  PLT 160 181 174  CREATININE 0.88 0.81 0.83   Estimated Creatinine Clearance: 65.1 mL/min (by C-G formula based on Cr of 0.83).  Recent Labs  04/13/15 0938  [facility] 14     Microbiology: Recent Results (from the past 720 hour(s))  Blood Culture (routine x 2)     Status: None (Preliminary result)   Collection Time: 04/09/15  7:49 PM  Result Value Ref Range Status   Specimen Description BLOOD RIGHT ARM  Final   Special Requests BOTTLES DRAWN AEROBIC AND ANAEROBIC 5CC  Final   Culture NO GROWTH 3 DAYS  Final   Report Status PENDING  Incomplete  Blood Culture (routine x 2)     Status: None (Preliminary result)   Collection Time: 04/09/15  7:56 PM  Result Value Ref Range Status   Specimen Description BLOOD RIGHT HAND  Final   Special Requests BOTTLES DRAWN AEROBIC ONLY 3CC  Final   Culture NO GROWTH 3 DAYS  Final   Report Status PENDING  Incomplete  Urine culture     Status: None   Collection Time: 04/09/15  8:27 PM  Result Value Ref Range Status   Specimen Description URINE, CLEAN CATCH  Final   Special Requests Normal  Final   Culture MULTIPLE SPECIES PRESENT, SUGGEST RECOLLECTION  Final   Report Status 04/12/2015 FINAL  Final  Culture, blood (routine x 2) Call MD if unable to obtain prior to antibiotics being given     Status: None (Preliminary  result)   Collection Time: 04/09/15 11:55 PM  Result Value Ref Range Status   Specimen Description BLOOD LEFT HAND  Final   Special Requests BOTTLES DRAWN AEROBIC AND ANAEROBIC 5CC  Final   Culture NO GROWTH 2 DAYS  Final   Report Status PENDING  Incomplete  Culture, blood (routine x 2) Call MD if unable to obtain prior to antibiotics being given     Status: None (Preliminary result)   Collection Time: 04/10/15 12:01 AM  Result Value Ref Range Status   Specimen Description BLOOD RIGHT HAND  Final   Special Requests BOTTLES DRAWN AEROBIC AND ANAEROBIC 5CC  Final   Culture NO GROWTH 2 DAYS  Final   Report Status PENDING  Incomplete  MRSA PCR Screening     Status: None   Collection Time: 04/10/15 12:09 AM  Result Value Ref Range Status   MRSA by PCR NEGATIVE NEGATIVE Final    Comment:        The GeneXpert MRSA Assay (FDA approved for NASAL specimens only), is one component of a comprehensive MRSA colonization surveillance program. It is not intended to diagnose MRSA infection nor to guide or monitor treatment for MRSA infections.     Anti-infectives    Start     Dose/Rate Route Frequency Ordered Stop   04/10/15 1000  vancomycin (VANCOCIN) IVPB 750 mg/150  ml premix     750 mg 150 mL/hr over 60 Minutes Intravenous Every 12 hours 04/09/15 2334     04/10/15 0400  piperacillin-tazobactam (ZOSYN) IVPB 3.375 g     3.375 g 12.5 mL/hr over 240 Minutes Intravenous Every 8 hours 04/09/15 2334     04/09/15 2115  piperacillin-tazobactam (ZOSYN) IVPB 3.375 g  Status:  Discontinued     3.375 g 12.5 mL/hr over 240 Minutes Intravenous  Once 04/09/15 2102 04/09/15 2102   04/09/15 2115  piperacillin-tazobactam (ZOSYN) IVPB 3.375 g     3.375 g 100 mL/hr over 30 Minutes Intravenous  Once 04/09/15 2103 04/09/15 2306   04/09/15 1930  vancomycin (VANCOCIN) 2,000 mg in sodium chloride 0.9 % 500 mL IVPB     2,000 mg 250 mL/hr over 120 Minutes Intravenous  Once 04/09/15 1916 04/09/15 2257    04/09/15 1930  piperacillin-tazobactam (ZOSYN) IVPB 3.375 g  Status:  Discontinued     3.375 g 150 mL/hr over 30 Minutes Intravenous  Once 04/09/15 1916 04/09/15 2101      Assessment: CC: SOB, cough  PMH: COPD, lung cancer, CAD, HTN, DM  ID: Vanc/zosyn D#5 for sepsis/PNA - Afebrile (one temp 96.5), WBC WNL, PCT 0.31>>0.18, LA 2.27, strep pneumo negative. Vanco trough 14 (given 1 hrs early last PM so AM level may actually be 15 or higher)  Vanc 8/26>> Zosyn 8/26>>  8/27 MRSA - NEG 8/26 BLood - NGTD 8/27 BC>> 8/26 Urine - negative  Goal of Therapy:  Vancomycin trough level 15-20 mcg/ml  Plan:  Plan: - Continue Vanc '750mg'$  IV Q12H -  - Continue zosyn 3.375gm IV Q8H (4 hr inf) - F/u renal fxn, C&S, clinical status and additional trough levels if continued   Veron Senner S. Alford Highland, PharmD, [facility] Clinical Staff Pharmacist Pager [phone]  Eilene Ghazi Stillinger 04/13/2015,10:48 AM

## 2015-04-13 NOTE — Telephone Encounter (Signed)
Lollie Marrow, RN for [facility] phoned to inquire if patient was scheduled to start radiation therapy. Explained the patient was evaluated by Dr. Tammi Klippel on 8/25. The patient was found to be weak and hypoxic. A chest xray was preformed and the patient was advised to follow up with Dr. Felipa Eth. Ultimately, the patient was admitted to [facility] with pneumonia. Explained that if the patient's condition stabilized Dr. Tammi Klippel plan to re-evaluate if radiation should be given. She verbalized understanding and expressed appreciation for the return call.

## 2015-04-13 NOTE — Progress Notes (Signed)
CRITICAL VALUE ALERT  Critical value received:  ABG ph- 7.2   CO2- 72.2  O2- 81  Bicarb- 34   Date of notification:  04/12/15  Time of notification:  0200  Critical value read back: yes  Nurse who received alert:  Remo Lipps, RN  MD notified (1st page):  Baltazar Najjar, NP  Time of first page:  0215  Responding MD:  CO2 trending down, pt already on bipap.

## 2015-04-13 NOTE — Clinical Social Work Placement (Signed)
   CLINICAL SOCIAL WORK PLACEMENT  NOTE  Date:  04/13/2015  Patient Details  Name: Darrell Livingston MRN: [id number] Date of Birth: 04/10/1921  Clinical Social Work is seeking post-discharge placement for this patient at the [place] level of care (*CSW will initial, date and re-position this form in  chart as items are completed):  Yes   Patient/family provided with [facility] Work Department's list of facilities offering this level of care within the geographic area requested by the patient (or if unable, by the patient's family).  Yes   Patient/family informed of their freedom to choose among providers that offer the needed level of care, that participate in Medicare, Medicaid or managed care program needed by the patient, have an available bed and are willing to accept the patient.  Yes   Patient/family informed of [facility]'s ownership interest in [facility] and [facility], as well as of the fact that they are under no obligation to receive care at these facilities.  PASRR submitted to EDS on 04/13/15     PASRR number received on 04/13/15     Existing PASRR number confirmed on       FL2 transmitted to all facilities in geographic area requested by pt/family on 04/13/15     FL2 transmitted to all facilities within larger geographic area on       Patient informed that his/her managed care company has contracts with or will negotiate with certain facilities, including the following:            Patient/family informed of bed offers received.  Patient chooses bed at       Physician recommends and patient chooses bed at      Patient to be transferred to   on  .  Patient to be transferred to facility by       Patient family notified on   of transfer.  Name of family member notified:        PHYSICIAN Please sign FL2, Please sign DNR     Additional Comment:    _______________________________________________ Cranford Mon,  LCSW 04/13/2015, 4:21 PM

## 2015-04-14 LAB — CULTURE, BLOOD (ROUTINE X 2)
CULTURE: NO GROWTH
Culture: NO GROWTH

## 2015-04-14 LAB — BLOOD GAS, ARTERIAL
Acid-Base Excess: 12.4 mmol/L — ABNORMAL HIGH (ref 0.0–2.0)
Bicarbonate: 38.2 mEq/L — ABNORMAL HIGH (ref 20.0–24.0)
DRAWN BY: 277551
O2 Content: 6 L/min
O2 Saturation: 95.4 %
PH ART: 7.364 (ref 7.350–7.450)
Patient temperature: 98.6
TCO2: 40.3 mmol/L (ref 0–100)
pCO2 arterial: 68.8 mmHg (ref 35.0–45.0)
pO2, Arterial: 76.2 mmHg — ABNORMAL LOW (ref 80.0–100.0)

## 2015-04-14 LAB — GLUCOSE, CAPILLARY
GLUCOSE-CAPILLARY: 122 mg/dL — AB (ref 65–99)
GLUCOSE-CAPILLARY: 141 mg/dL — AB (ref 65–99)
GLUCOSE-CAPILLARY: 157 mg/dL — AB (ref 65–99)
Glucose-Capillary: 106 mg/dL — ABNORMAL HIGH (ref 65–99)
Glucose-Capillary: 142 mg/dL — ABNORMAL HIGH (ref 65–99)
Glucose-Capillary: 190 mg/dL — ABNORMAL HIGH (ref 65–99)

## 2015-04-14 LAB — HEMOGLOBIN A1C
Hgb A1c MFr Bld: 5.9 % — ABNORMAL HIGH (ref 4.8–5.6)
Mean Plasma Glucose: 123 mg/dL

## 2015-04-14 MED ORDER — FUROSEMIDE 10 MG/ML IJ SOLN
60.0000 mg | Freq: Once | INTRAMUSCULAR | Status: AC
  Administered 2015-04-14: 60 mg via INTRAVENOUS
  Filled 2015-04-14: qty 6

## 2015-04-14 MED ORDER — IPRATROPIUM-ALBUTEROL 0.5-2.5 (3) MG/3ML IN SOLN
3.0000 mL | Freq: Four times a day (QID) | RESPIRATORY_TRACT | Status: DC
  Administered 2015-04-14 – 2015-04-21 (×24): 3 mL via RESPIRATORY_TRACT
  Filled 2015-04-14 (×26): qty 3

## 2015-04-14 NOTE — Progress Notes (Signed)
CSW provided bed offers to patient son- choice pending  CSW will continue to follow.  Domenica Reamer, [place] Social Worker [phone]

## 2015-04-14 NOTE — Progress Notes (Signed)
[facility] TEAM 1 - Stepdown/ICU TEAM Progress Note  Darrell Livingston INO:[id number] DOB: 04-28-1921 DOA: 04/09/2015 PCP: Mathews Argyle, MD  Admit HPI / Brief Narrative: 81 y.o. male with hx of COPD, DM type II, and LUL lung cancer who presented to [facility] 04/09/2015 with main concern of several weeks duration of progressively worsening productive cough of yellow sputum, occasionally blood tinged, dyspnea with exertion and at rest, subjective fevers, chills, malaise, and poor oral intake.   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 He was subsequently diagnosed with a right middle lung non-small cell carcinoma in May 2010 which was also treated with stereotactic body radiotherapy in July 2010. The patient obtains routine follow-up at the [facility] in [place]. CT imaging on 03/01/2015 demonstrated 2 left upper lung findings. A 7 mm cavitary upper lung nodule increased to 1.5 cm with a new solid 1.1 cm cavitating left upper lung lesion. These 2 lesions were felt to represent neoplasms and the patient is currently been referred closer to home for potential management of synchronous stage I left upper lung cancers.  HPI/Subjective: The patient is much more alert today.  He answers some direct questions with one-word answers but for many others he simply stares in the examiner.  His eyes are open and he is looking about the room.  He doesn't interact with his sons to a limited extent but is essentially nonverbal at present.  His respirations appear comfortable at this time and he is currently not on BiPAP but requiring 6 L of nasal cannula oxygen.  Assessment/Plan:  Sepsis secondary to HCAP bilateral lower lobes - continue vancomycin and zosyn   Acute hypoxic respiratory failure - acute hypercarbic respiratory failure - secondary to HCAP imposed  on known COPD - acute decline likely due to increased stress upon pulmonary system of baseline severe COPD, lung cancer, and now new pneumonia -The patient responded well to BiPAP and is now been liberated - we will need to follow however to determine if he will continue to improve without BiPAP support  Altered mental status - nonverbal - This may simply represent toxic metabolic encephalopathy/critical-care delirium - nonetheless the patient also has multiple risk factors for CVA - regardless he would not be a candidate for aggressive intervention and is presently not stable from respiratory standpoint to warrant a trip to the radiology department - we will monitor the patient clinically and consider brain imaging if his symptoms do not improve but he stabilizes otherwise  COPD  -No clear bronchospastic exacerbation  Moderate PCM - nutritionist consulted   DM type 2 - CBG currently well-controlled  Hypertension - Blood pressure is trending upward - monitor without adjusting medical therapy today   Cancer of upper lobe of left lung  Obesity  - Body mass index is 31.5 kg/(m^2).  Code Status: NO CODE BLUE Family Communication: Spoke with 2 sons at bedside at length Disposition Plan: SDU  Consultants: Rad Onc  Procedures: none  Antibiotics: Vancomycin 8/26 > Zosyn 8/26 >  DVT prophylaxis: SQ heparin   Objective: Blood pressure 139/93, pulse 91, temperature 98.1 F (36.7 C), temperature source Oral, resp. rate 22, height '5\' 9"'$  (1.753 m), weight 96.8 kg (213 lb 6.5 oz), SpO2 98 %.  Intake/Output Summary (Last 24 hours) at 04/14/15 1131 Last data filed at 04/14/15 0303  Gross per 24 hour  Intake    300 ml  Output      0 ml  Net    300 ml   Exam: General: Alert but altered with comfortable respirations at time of my exam  Lungs: Very poor air movement throughout all fields - no active wheeze Cardiovascular: Regular rate and rhythm without murmur or gallop  Abdomen:  Nontender, nondistended, soft, bowel sounds positive, no rebound, no ascites, no appreciable mass Extremities: No significant cyanosis, clubbing, or edema bilateral lower extremities  Data Reviewed: Basic Metabolic Panel:  Recent Labs Lab 04/09/15 1636 04/11/15 0616 04/12/15 0500 04/13/15 0349  NA 137 138 138 137  K 4.3 4.4 4.4 4.3  CL 101 97* 98* 95*  CO2 29 33* 33* 35*  GLUCOSE 104* 104* 122* 116*  BUN 22* '15 12 17  '$ CREATININE 0.94 0.88 0.81 0.83  CALCIUM 9.3 8.6* 8.8* 8.5*    CBC:  Recent Labs Lab 04/09/15 1636 04/11/15 0616 04/12/15 0500 04/13/15 0349  WBC 12.9* 9.7 8.9 8.2  HGB 14.1 13.9 13.9 13.5  HCT 44.2 45.2 45.4 45.1  MCV 94.8 99.1 99.6 100.0  PLT 176 160 181 174    Liver Function Tests:  Recent Labs Lab 04/13/15 0349  AST 23  ALT 28  ALKPHOS 79  BILITOT 1.2  PROT 6.7  ALBUMIN 2.4*    CBG:  Recent Labs Lab 04/13/15 1613 04/13/15 2009 04/14/15 0026 04/14/15 0307 04/14/15 0814  GLUCAP 114* 151* 157* 122* 106*    Recent Results (from the past 240 hour(s))  Blood Culture (routine x 2)     Status: None   Collection Time: 04/09/15  7:49 PM  Result Value Ref Range Status   Specimen Description BLOOD RIGHT ARM  Final   Special Requests BOTTLES DRAWN AEROBIC AND ANAEROBIC 5CC  Final   Culture NO GROWTH 5 DAYS  Final   Report Status 04/14/2015 FINAL  Final  Blood Culture (routine x 2)     Status: None   Collection Time: 04/09/15  7:56 PM  Result Value Ref Range Status   Specimen Description BLOOD RIGHT HAND  Final   Special Requests BOTTLES DRAWN AEROBIC ONLY 3CC  Final   Culture NO GROWTH 5 DAYS  Final   Report Status 04/14/2015 FINAL  Final  Urine culture     Status: None   Collection Time: 04/09/15  8:27 PM  Result Value Ref Range Status   Specimen Description URINE, CLEAN CATCH  Final   Special Requests Normal  Final   Culture MULTIPLE SPECIES PRESENT, SUGGEST RECOLLECTION  Final   Report Status 04/12/2015 FINAL  Final  Culture,  blood (routine x 2) Call MD if unable to obtain prior to antibiotics being given     Status: None (Preliminary result)   Collection Time: 04/09/15 11:55 PM  Result Value Ref Range Status   Specimen Description BLOOD LEFT HAND  Final   Special Requests BOTTLES DRAWN AEROBIC AND ANAEROBIC 5CC  Final   Culture NO GROWTH 4 DAYS  Final   Report Status PENDING  Incomplete  Culture, blood (routine x 2) Call MD if unable to obtain prior to antibiotics being given     Status: None (Preliminary result)   Collection Time: 04/10/15 12:01 AM  Result Value Ref Range Status   Specimen Description BLOOD RIGHT HAND  Final   Special Requests BOTTLES DRAWN AEROBIC AND ANAEROBIC 5CC  Final   Culture NO GROWTH 4 DAYS  Final   Report Status PENDING  Incomplete  MRSA PCR  Screening     Status: None   Collection Time: 04/10/15 12:09 AM  Result Value Ref Range Status   MRSA by PCR NEGATIVE NEGATIVE Final    Comment:        The GeneXpert MRSA Assay (FDA approved for NASAL specimens only), is one component of a comprehensive MRSA colonization surveillance program. It is not intended to diagnose MRSA infection nor to guide or monitor treatment for MRSA infections.   Stat Gram stain     Status: None   Collection Time: 04/13/15 11:05 AM  Result Value Ref Range Status   Specimen Description SPUTUM  Final   Special Requests NONE  Final   Gram Stain   Final    ABUNDANT WBC PRESENT, PREDOMINANTLY PMN NO ORGANISMS SEEN    Report Status 04/13/2015 FINAL  Final  Culture, expectorated sputum-assessment     Status: None   Collection Time: 04/13/15 11:32 AM  Result Value Ref Range Status   Specimen Description   Final   Special Requests NONE  Final   Sputum evaluation   Final    THIS SPECIMEN IS ACCEPTABLE. RESPIRATORY CULTURE REPORT TO FOLLOW.   Report Status 04/13/2015 FINAL  Final  Culture, respiratory (NON-Expectorated)     Status: None (Preliminary result)   Collection Time: 04/13/15 11:32 AM  Result  Value Ref Range Status   Specimen Description SPUTUM  Final   Special Requests NONE  Final   Gram Stain   Final    FEW WBC PRESENT, PREDOMINANTLY MONONUCLEAR RARE SQUAMOUS EPITHELIAL CELLS PRESENT NO ORGANISMS SEEN Performed at [facility]    Culture   Final    Culture reincubated for better growth Performed at [facility]    Report Status PENDING  Incomplete     Studies:   Recent x-ray studies have been reviewed in detail by the Attending Physician  Scheduled Meds:  Scheduled Meds: . budesonide-formoterol  2 puff Inhalation BID  . feeding supplement (ENSURE ENLIVE)  237 mL Oral BID BM  . guaiFENesin  600 mg Oral BID  . heparin  5,000 Units Subcutaneous 3 times per day  . insulin aspart  0-9 Units Subcutaneous TID WC  . ipratropium-albuterol  3 mL Nebulization Q6H  . latanoprost  1 drop Both Eyes QHS  . methylPREDNISolone (SOLU-MEDROL) injection  60 mg Intravenous Q24H  . piperacillin-tazobactam (ZOSYN)  IV  3.375 g Intravenous Q8H  . tiotropium  18 mcg Inhalation Daily  . triamcinolone  1 application Topical [id number]  . vancomycin  750 mg Intravenous Q12H    Time spent on care of this patient: 35 mins   Page Pucciarelli T , MD   [facility] Office  [phone] Pager - Text Page per Shea Evans as per below:  On-Call/Text Page:      [email or web address]      password [email or web address]  If 7PM-7AM, please contact night-coverage [email or web address] Password [email or web address] 04/14/2015, 11:31 AM   LOS: 5 days

## 2015-04-15 ENCOUNTER — Inpatient Hospital Stay (HOSPITAL_COMMUNITY): Payer: Medicare Other

## 2015-04-15 DIAGNOSIS — C3412 Malignant neoplasm of upper lobe, left bronchus or lung: Secondary | ICD-10-CM

## 2015-04-15 DIAGNOSIS — E440 Moderate protein-calorie malnutrition: Secondary | ICD-10-CM

## 2015-04-15 DIAGNOSIS — E8729 Other acidosis: Secondary | ICD-10-CM | POA: Diagnosis present

## 2015-04-15 DIAGNOSIS — R410 Disorientation, unspecified: Secondary | ICD-10-CM

## 2015-04-15 DIAGNOSIS — E872 Acidosis: Secondary | ICD-10-CM

## 2015-04-15 DIAGNOSIS — J849 Interstitial pulmonary disease, unspecified: Secondary | ICD-10-CM | POA: Diagnosis present

## 2015-04-15 LAB — BLOOD GAS, ARTERIAL
ACID-BASE EXCESS: 15.8 mmol/L — AB (ref 0.0–2.0)
Bicarbonate: 41.9 mEq/L — ABNORMAL HIGH (ref 20.0–24.0)
DRAWN BY: 422461
O2 Content: 6 L/min
O2 SAT: 96.6 %
PATIENT TEMPERATURE: 97.7
PO2 ART: 94.2 mmHg (ref 80.0–100.0)
TCO2: 44.2 mmol/L (ref 0–100)
pCO2 arterial: 73.1 mmHg (ref 35.0–45.0)
pH, Arterial: 7.373 (ref 7.350–7.450)

## 2015-04-15 LAB — GLUCOSE, CAPILLARY
GLUCOSE-CAPILLARY: 108 mg/dL — AB (ref 65–99)
GLUCOSE-CAPILLARY: 129 mg/dL — AB (ref 65–99)
Glucose-Capillary: 144 mg/dL — ABNORMAL HIGH (ref 65–99)
Glucose-Capillary: 121 mg/dL — ABNORMAL HIGH (ref 65–99)

## 2015-04-15 LAB — CULTURE, BLOOD (ROUTINE X 2)
CULTURE: NO GROWTH
Culture: NO GROWTH

## 2015-04-15 LAB — COMPREHENSIVE METABOLIC PANEL
ALBUMIN: 2.5 g/dL — AB (ref 3.5–5.0)
ALT: 29 U/L (ref 17–63)
AST: 28 U/L (ref 15–41)
Alkaline Phosphatase: 76 U/L (ref 38–126)
Anion gap: 6 (ref 5–15)
BUN: 21 mg/dL — AB (ref 6–20)
CHLORIDE: 92 mmol/L — AB (ref 101–111)
CO2: 43 mmol/L — AB (ref 22–32)
CREATININE: 0.84 mg/dL (ref 0.61–1.24)
Calcium: 8.6 mg/dL — ABNORMAL LOW (ref 8.9–10.3)
GFR calc Af Amer: >60 mL/min (ref >60)
GFR calc non Af Amer: >60 mL/min (ref >60)
GLUCOSE: 150 mg/dL — AB (ref 65–99)
POTASSIUM: 3.9 mmol/L (ref 3.5–5.1)
SODIUM: 141 mmol/L (ref 135–145)
Total Bilirubin: 1 mg/dL (ref 0.3–1.2)
Total Protein: 6.3 g/dL — ABNORMAL LOW (ref 6.5–8.1)

## 2015-04-15 NOTE — Care Management Important Message (Signed)
Important Message  Patient Details  Name: JUNIUS FAUCETT MRN: [id number] Date of Birth: 20-Aug-1920   Medicare Important Message Given:  Yes-third notification given    Pricilla Handler 04/15/2015, 10:57 AM

## 2015-04-15 NOTE — Progress Notes (Signed)
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 He was subsequently diagnosed with a right middle lung non-small cell carcinoma in May 2010 which was also treated with stereotactic body radiotherapy in July 2010. The patient obtains routine follow-up at the [facility] in [place]. CT imaging on 03/01/2015 demonstrated 2 left upper lung findings. A 7 mm cavitary upper lung nodule increased to 1.5 cm with a new solid 1.1 cm cavitating left upper lung lesion. These 2 lesions were felt to represent neoplasms and the patient is currently been referred closer to home for potential management of synchronous stage I left upper lung cancers.   HPI/Subjective: 9/1  A/O 1 (does not know where, when, why), however does [place] [employer] with [employer].    Assessment/Plan: AMS  -Most likely multifactorial sepsis secondary to HCAP, and hypercapnia -Patient appears to be aspirating speech consult placed for swallow study -MRI brain negative acute stroke see results below; Given. Patient's findings. Mental status unlikely to significantly improve  Sepsis secondary to HCAP bilateral lower lobes - continue vancomycin and zosyn for full seven-day  course of treatment -NTS BID -Flutter valve -Solu-Medrol 60 mg daily  Acute hypoxic respiratory failure - acute hypercarbic respiratory failure - secondary to HCAP imposed on known COPD - an acute decline today likely due to increased stress upon pulmonary system of baseline severe COPD, lung cancer, and now new pneumonia -9/1 ABG shows PCO2 = 72, will NTS patient and then restart on BiPAP as this may be the cause of patient's AMS   Non-anion gap respiratory acidosis -Compensated  COPD/Aspiration?  -No clear bronchospastic exacerbation at present but this appears to be contributing to his acute decline simply due to his poor baseline pulmonary function -Diffuse Rhonchi but improved.  -Patient nothing by mouth -Speech consult placed for swallow study  Moderate malnutrition - nutritionist consulted   DM type 2 controlled - CBG currently well-controlled -8/30 Hemoglobin A1c= 5.9 -Lipid panel; within ADA guidelines except for low HDL   Hypertension - Blood pressure reasonably controlled at present time  Cancer of upper lobe of left lung -stage IA non-small cell carcinomas of the left upper lung without biopsy  Goals of care -Need to revisit palliative care vs hospice with son   Code Status: DO NOT RESUSCITATE Family Communication: Son present at time of exam Disposition Plan: Resolution HCAP    Consultants: Rad Onc  Procedure/Significant Events: 9/1 MRI brain;-Remote lacunar infarcts are present posteriorly in the right lentiform nucleus and throughout the left basal ganglia.  -Remote ischemic changes are present in the thalami bilaterally.  -Moderate chronic ischemic changes within the basal ganglia and brainstem.   Culture 8/26 blood right arm/hand NGTD 8/26 urine positive multiple species; recollect 8/26 blood left hand NGTD 8/27 blood right hand  NGTD 8/27 MRSA by PCR negative 8/30 sputum negative    Antibiotics: Vancomycin 8/26 > Zosyn 8/26 >   DVT  prophylaxis: Subcutaneous heparin   Devices    LINES / TUBES:      Continuous Infusions:   Objective: VITAL SIGNS: Temp: 97.7 F (36.5 C) (09/01 2000) Temp Source: Axillary (09/01 2000) BP: 145/61 mmHg (09/01 1800) Pulse Rate: 91 (09/01 1800) SPO2; 95% on BiPAP 16/8, FIO2: 40%   Intake/Output Summary (Last 24 hours) at 04/15/15 2259 Last data filed at 04/15/15 1615  Gross per 24 hour  Intake    320 ml  Output   2000 ml  Net  -1680 ml     Exam: General: A/O 1 (does not know where, when, why), confused unable to answer questions appropriately, follows commands, positive acute respiratory distress Eyes: Negative headache, eye pain, double vision,negative scleral hemorrhage ENT: Negative Runny nose, negative ear pain, negative tinnitus, negative gingival bleeding, Neck:  Negative scars, masses, torticollis, lymphadenopathy, JVD Lungs: diffuse mild bilateral expiratory wheezing, diffuse rhonchi Lt > Rt  Cardiovascular: Regular rate and rhythm without murmur gallop or rub normal S1 and S2 Abdomen:negative abdominal pain, negative dysphagia, nondistended, positive soft, bowel sounds, no rebound, no ascites, no appreciable mass Extremities: No significant cyanosis, clubbing, or edema bilateral lower extremities Psychiatric:  Unable to assess  Neurologic:  Cranial nerves II through XII intact, tongue/uvula midline, all extremities muscle strength 5/5, sensation intact throughout, negative dysarthria, negative expressive aphasia, negative receptive aphasia.   Data Reviewed: Basic Metabolic Panel:  Recent Labs Lab 04/09/15 1636 04/11/15 0616 04/12/15 0500 04/13/15 0349 04/15/15 0219  NA 137 138 138 137 141  K 4.3 4.4 4.4 4.3 3.9  CL 101 97* 98* 95* 92*  CO2 29 33* 33* 35* 43*  GLUCOSE 104* 104* 122* 116* 150*  BUN 22* '15 12 17 '$ 21*  CREATININE 0.94 0.88 0.81 0.83 0.84  CALCIUM 9.3 8.6* 8.8* 8.5* 8.6*   Liver Function Tests:  Recent Labs Lab 04/13/15 0349  04/15/15 0219  AST 23 28  ALT 28 29  ALKPHOS 79 76  BILITOT 1.2 1.0  PROT 6.7 6.3*  ALBUMIN 2.4* 2.5*   No results for input(s): LIPASE, AMYLASE in the last 168 hours. No results for input(s): AMMONIA in the last 168 hours. CBC:  Recent Labs Lab 04/09/15 1636 04/11/15 0616 04/12/15 0500 04/13/15 0349  WBC 12.9* 9.7 8.9 8.2  HGB 14.1 13.9 13.9 13.5  HCT 44.2 45.2 45.4 45.1  MCV 94.8 99.1 99.6 100.0  PLT 176 160 181 174   Cardiac Enzymes: No results for input(s): CKTOTAL, CKMB, CKMBINDEX, TROPONINI in the last 168 hours. BNP (last 3 results)  Recent Labs  04/09/15 1949  BNP 436.6*    ProBNP (last 3 results) No results for input(s): PROBNP in the last 8760 hours.  CBG:  Recent Labs Lab 04/14/15 2212 04/15/15 0743 04/15/15 1257 04/15/15 1749 04/15/15 2213  GLUCAP 190* 108* 144* 121* 129*    Recent Results (from the past 240 hour(s))  Blood Culture (routine x 2)     Status: None   Collection Time: 04/09/15  7:49 PM  Result Value Ref Range Status   Specimen Description BLOOD RIGHT ARM  Final   Special Requests BOTTLES DRAWN AEROBIC AND ANAEROBIC 5CC  Final   Culture NO GROWTH 5 DAYS  Final   Report Status 04/14/2015 FINAL  Final  Blood Culture (routine x 2)     Status: None   Collection Time: 04/09/15  7:56 PM  Result Value Ref Range Status   Specimen Description BLOOD RIGHT HAND  Final   Special Requests BOTTLES DRAWN AEROBIC ONLY 3CC  Final   Culture NO GROWTH 5 DAYS  Final   Report Status 04/14/2015 FINAL  Final  Urine culture     Status: None   Collection Time: 04/09/15  8:27 PM  Result Value Ref Range Status   Specimen Description URINE, CLEAN CATCH  Final   Special Requests Normal  Final   Culture MULTIPLE SPECIES PRESENT, SUGGEST RECOLLECTION  Final   Report Status 04/12/2015 FINAL  Final  Culture, blood (routine x 2) Call MD if unable to obtain prior to antibiotics being given     Status: None   Collection Time: 04/09/15 11:55 PM  Result  Value Ref Range Status   Specimen Description BLOOD LEFT HAND  Final   Special Requests BOTTLES DRAWN AEROBIC AND ANAEROBIC 5CC  Final   Culture NO GROWTH 5 DAYS  Final   Report Status 04/15/2015 FINAL  Final  Culture, blood (routine x 2) Call MD if unable to obtain prior to antibiotics being given     Status: None   Collection Time: 04/10/15 12:01 AM  Result Value Ref Range Status   Specimen Description BLOOD RIGHT HAND  Final   Special Requests BOTTLES DRAWN AEROBIC AND ANAEROBIC 5CC  Final   Culture NO GROWTH 5 DAYS  Final   Report Status 04/15/2015 FINAL  Final  MRSA PCR Screening     Status: None   Collection Time: 04/10/15 12:09 AM  Result Value Ref Range Status   MRSA by PCR NEGATIVE NEGATIVE Final    Comment:        The GeneXpert MRSA Assay (FDA approved for NASAL specimens only), is one component of a comprehensive MRSA colonization surveillance program. It is not intended to diagnose MRSA infection nor to guide or monitor treatment for MRSA infections.   Stat Gram stain     Status: None   Collection Time: 04/13/15 11:05 AM  Result Value Ref Range Status   Specimen Description SPUTUM  Final   Special Requests NONE  Final   Gram Stain   Final    ABUNDANT WBC PRESENT, PREDOMINANTLY PMN NO ORGANISMS SEEN    Report Status 04/13/2015 FINAL  Final  Culture, expectorated sputum-assessment     Status: None   Collection Time: 04/13/15 11:32 AM  Result Value Ref Range Status   Specimen Description   Final   Special Requests NONE  Final   Sputum evaluation   Final    THIS SPECIMEN IS ACCEPTABLE. RESPIRATORY CULTURE REPORT TO FOLLOW.   Report Status 04/13/2015 FINAL  Final  Culture, respiratory (NON-Expectorated)     Status: None (Preliminary result)   Collection Time: 04/13/15 11:32 AM  Result Value Ref Range Status   Specimen Description SPUTUM  Final   Special Requests NONE  Final   Gram Stain   Final    FEW WBC PRESENT, PREDOMINANTLY MONONUCLEAR RARE SQUAMOUS  EPITHELIAL CELLS PRESENT NO ORGANISMS SEEN Performed at [facility]    Culture   Final    MODERATE GRAM NEGATIVE RODS Performed at [facility]    Report Status PENDING  Incomplete     Studies:  Recent x-ray studies have been reviewed in detail by the Attending Physician  Scheduled Meds:  Scheduled Meds: . budesonide-formoterol  2 puff Inhalation BID  . feeding supplement (ENSURE ENLIVE)  237 mL Oral BID BM  . guaiFENesin  600 mg  Oral BID  . heparin  5,000 Units Subcutaneous 3 times per day  . insulin aspart  0-9 Units Subcutaneous TID WC  . ipratropium-albuterol  3 mL Nebulization QID  . latanoprost  1 drop Both Eyes QHS  . methylPREDNISolone (SOLU-MEDROL) injection  60 mg Intravenous Q24H  . piperacillin-tazobactam (ZOSYN)  IV  3.375 g Intravenous Q8H  . triamcinolone  1 application Topical F6433  . vancomycin  750 mg Intravenous Q12H    Time spent on care of this patient: 40 mins   WOODS, Geraldo Docker , MD  [facility] Office  [phone] Pager - [phone]  On-Call/Text Page:      [email or web address]      password [email or web address]  If 7PM-7AM, please contact night-coverage [email or web address] Password [email or web address] 04/15/2015, 10:59 PM   LOS: 6 days   Care during the described time interval was provided by me .  I have reviewed this patient's available data, including medical history, events of note, physical examination, and all test results as part of my evaluation. I have personally reviewed and interpreted all radiology studies.   Dia Crawford, MD [phone] Pager

## 2015-04-15 NOTE — Progress Notes (Signed)
NTS performed per MD order. Secretions very thick and not much returned. Pt tolerated well and no complications noted.

## 2015-04-16 LAB — GLUCOSE, CAPILLARY
GLUCOSE-CAPILLARY: 91 mg/dL (ref 65–99)
GLUCOSE-CAPILLARY: 152 mg/dL — AB (ref 65–99)
GLUCOSE-CAPILLARY: 176 mg/dL — AB (ref 65–99)
Glucose-Capillary: 139 mg/dL — ABNORMAL HIGH (ref 65–99)

## 2015-04-16 LAB — CULTURE, RESPIRATORY

## 2015-04-16 LAB — CULTURE, RESPIRATORY W GRAM STAIN

## 2015-04-16 MED ORDER — BISACODYL 10 MG RE SUPP
10.0000 mg | Freq: Every day | RECTAL | Status: DC | PRN
  Administered 2015-04-18 – 2015-04-19 (×2): 10 mg via RECTAL
  Filled 2015-04-16 (×2): qty 1

## 2015-04-16 MED ORDER — RESOURCE THICKENUP CLEAR PO POWD
ORAL | Status: DC | PRN
  Filled 2015-04-16: qty 125

## 2015-04-16 MED ORDER — STARCH (THICKENING) PO POWD: ORAL | Status: DC | PRN

## 2015-04-16 MED ORDER — SENNOSIDES-DOCUSATE SODIUM 8.6-50 MG PO TABS
1.0000 | ORAL_TABLET | Freq: Two times a day (BID) | ORAL | Status: DC
  Administered 2015-04-16 – 2015-04-21 (×10): 1 via ORAL
  Filled 2015-04-16 (×10): qty 1

## 2015-04-16 MED ORDER — LEVOFLOXACIN IN D5W 750 MG/150ML IV SOLN
750.0000 mg | INTRAVENOUS | Status: AC
  Administered 2015-04-16 – 2015-04-20 (×5): 750 mg via INTRAVENOUS
  Filled 2015-04-16 (×5): qty 150

## 2015-04-16 MED ORDER — POLYETHYLENE GLYCOL 3350 17 G PO PACK
17.0000 g | PACK | Freq: Two times a day (BID) | ORAL | Status: DC | PRN
  Administered 2015-04-16: 17 g via ORAL
  Filled 2015-04-16: qty 1

## 2015-04-16 MED ORDER — ENOXAPARIN SODIUM 40 MG/0.4ML ~~LOC~~ SOLN
40.0000 mg | SUBCUTANEOUS | Status: DC
  Administered 2015-04-16 – 2015-04-20 (×5): 40 mg via SUBCUTANEOUS
  Filled 2015-04-16 (×5): qty 0.4

## 2015-04-16 NOTE — Evaluation (Signed)
Clinical/Bedside Swallow Evaluation Patient Details  Name: Darrell Livingston MRN: [id number] Date of Birth: 1921/03/21  Today's Date: 04/16/2015 Time: SLP Start Time (ACUTE ONLY): 43 SLP Stop Time (ACUTE ONLY): 0950 SLP Time Calculation (min) (ACUTE ONLY): 30 min  Past Medical History:  Past Medical History  Diagnosis Date  . COPD (chronic obstructive pulmonary disease)   . Lung cancer   . Cardiac disorder   . Coronary artery disease   . Hypertension   . Diabetes mellitus   . COPD (chronic obstructive pulmonary disease) 07/20/2011  . DM type 2 (diabetes mellitus, type 2) 07/20/2011  . Hypertension 07/20/2011   Past Surgical History:  Past Surgical History  Procedure Laterality Date  . Cardiac surgery    . Cholecystectomy    . Coronary angioplasty with stent placement    . Eye surgery     HPI:  81 y.o. male who presents to the ED with c/o SOB and cough.CXR Stable multi focal pneumonia and pleural effusions since yesterday, emphysema. PMH: COPD, lung cancer, DM, HTN.   Assessment / Plan / Recommendation Clinical Impression  Pt with baseline cough due to pna, mildly increased work of breathing and history of COPD. Immediate coughs following thin with likely penetration or aspiration. Delayed mild coughs with nectar and puree difficult to determine etiology at bedside. SLP explained MBS to son who prefers not to proceed with MBS. Recommend Dys 1 (son/pt in agreement due to top denture ill fitting) nectar liquids, no straw, crush pills and full assist/supervision. ST to follow.    Aspiration Risk   (mod-severe)    Diet Recommendation Nectar;Dysphagia 1 (Puree)   Medication Administration: Crushed with puree Compensations: Slow rate;Small sips/bites    Other  Recommendations Oral Care Recommendations: Oral care BID   Follow Up Recommendations       Frequency and Duration min 1 x/week  2 weeks   Pertinent Vitals/Pain none    SLP Swallow Goals     Swallow Study Prior  Functional Status       General Other Pertinent Information: 81 y.o. male who presents to the ED with c/o SOB and cough.CXR Stable multi focal pneumonia and pleural effusions since yesterday, emphysema. PMH: COPD, lung cancer, DM, HTN. Type of Study: Bedside swallow evaluation Previous Swallow Assessment:  (none) Diet Prior to this Study: Dysphagia 1 (puree);Nectar-thick liquids Temperature Spikes Noted: No Respiratory Status: Supplemental O2 delivered via (comment) History of Recent Intubation: No Behavior/Cognition: Alert;Cooperative;Pleasant mood Oral Cavity - Dentition:  (upper lower denture, top ill fitting) Self-Feeding Abilities: Able to feed self;Needs set up Patient Positioning: Upright in bed Baseline Vocal Quality: Normal Volitional Cough: Strong Volitional Swallow: Able to elicit    Oral/Motor/Sensory Function Overall Oral Motor/Sensory Function: Appears within functional limits for tasks assessed   Ice Chips Ice chips: Impaired Presentation: Spoon Pharyngeal Phase Impairments: Cough - Delayed   Thin Liquid Thin Liquid: Impaired Presentation: Spoon Pharyngeal  Phase Impairments: Cough - Immediate    Nectar Thick Nectar Thick Liquid: Impaired Presentation: Cup Pharyngeal Phase Impairments: Cough - Delayed   Honey Thick Honey Thick Liquid: Not tested   Puree Puree: Impaired Pharyngeal Phase Impairments: Cough - Delayed   Solid   GO    Solid: Not tested (denture top not fitting)       Mick Sell, Orbie Pyo 04/16/2015,10:03 AM  Orbie Pyo Colvin Caroli[employer] [phone]

## 2015-04-16 NOTE — Progress Notes (Signed)
Nutrition Follow-up  DOCUMENTATION CODES:   Obesity unspecified  INTERVENTION:    Continue Ensure Enlive po BID, each supplement provides 350 kcal and 20 grams of protein   Magic cup TID with meals, each supplement provides 290 kcal and 9 grams of protein  NUTRITION DIAGNOSIS:   Increased nutrient needs related to chronic illness as evidenced by estimated needs, ongoing  GOAL:   Patient will meet greater than or equal to 90% of their needs, unmet  MONITOR:   PO intake, Supplement acceptance, Labs, Weight trends, I & O's  ASSESSMENT:   81 y.o. Male who presents to the ED with c/o SOB and cough. Symptoms have onset insidiously over the past couple of weeks. Have been worsening. Got put on O2 by his PCP yesterday and sent in to hospital today as these continued to worsen. Patient has h/o lung CA, very small tumor burden with LUL nodules that is being managed by oncology.  Family members shaving pt's facial hair upon RD visit.  Continues on a Dys 1, nectar thick liquid diet.  Speech Path following.  Noted pt's son does not want to proceed with MBSS.  PO intake variable at 10-50% per flowsheet records.  Receiving Ensure Enlive supplements BID.    Diet Order:  DIET - DYS 1 Room service appropriate?: Yes; Fluid consistency:: Nectar Thick  Skin:  Reviewed, no issues  Last BM:  8/27  Height:   Ht Readings from Last 1 Encounters:  04/10/15 '5\' 9"'$  (1.753 m)    Weight:   Wt Readings from Last 1 Encounters:  04/11/15 213 lb 6.5 oz (96.8 kg)    Ideal Body Weight:  73 kg  BMI:  Body mass index is 31.5 kg/(m^2).  Estimated Nutritional Needs:   Kcal:  1650-1850  Protein:  80-90 gm  Fluid:  1.6-1.8 L  EDUCATION NEEDS:   No education needs identified at this time  Arthur Holms, RD, LDN Pager #: [phone] After-Hours Pager #: [phone]

## 2015-04-16 NOTE — Progress Notes (Signed)
[place] TEAM 1 - Stepdown/ICU TEAM Progress Note  Darrell Livingston UXL:[id number] DOB: 1920/12/13 DOA: 04/09/2015 PCP: Mathews Argyle, MD  Admit HPI / Brief Narrative: 81 y.o. male [employer] [place] [employer] with hx of COPD, DM type II, and LUL lung cancer who presented to [facility] 04/09/2015 with main concern of several weeks duration of progressively worsening productive cough of yellow sputum, occasionally blood tinged, dyspnea with exertion and at rest, subjective fevers, chills, malaise, and poor oral intake.   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 He was subsequently diagnosed with a right middle lung non-small cell carcinoma in May 2010 which was also treated with stereotactic body radiotherapy in July 2010. The patient obtains routine follow-up at the [facility] in [place]. CT imaging on 03/01/2015 demonstrated 2 left upper lung findings. A 7 mm cavitary upper lung nodule increased to 1.5 cm with a new solid 1.1 cm cavitating left upper lung lesion. These 2 lesions were felt to represent neoplasms and the patient is currently been referred closer to home for potential management of synchronous stage I left upper lung cancers.  HPI/Subjective: The patient is much more alert and conversant today than I have seen him since assuming his care.  His voice is present but remains weak.  He denies chest pain shortness of breath fevers or chills.  Assessment/Plan:  Sepsis secondary to HCAP bilateral lower lobes - Change to Levaquin for better coverage of stenotrophomonas noted in respiratory culture - clinically improving steadily  Acute hypoxic respiratory failure - acute hypercarbic respiratory failure - secondary to HCAP imposed on known COPD - acute decline earlier in stay likely due to increased stress upon pulmonary system of  baseline severe COPD, lung cancer, and now new pneumonia -The patient responded well to BiPAP and has been been liberated to nasal cannula - he continues to slowly improve  Altered mental status - This appears to represent toxic metabolic encephalopathy/critical-care delirium - MRI unrevealing for acute changes - mental status dramatically improved today  Dysphagia - SLP has recommended dysphagia 1 diet with nectar thick liquids - hopefully this is simply due to weakness and will improve as he continues to improve  COPD  -No clear bronchospastic exacerbation  Moderate PCM - nutritionist consulted   DM type 2 - CBG currently well-controlled  Hypertension - Blood pressure is trending upward - monitor without adjusting medical therapy today   Cancer of upper lobe of left lung  Obesity  - Body mass index is 31.5 kg/(m^2).  Code Status: NO CODE BLUE Family Communication: Spoke with 2 sons at bedside at length Disposition Plan: SDU  Consultants: Rad Onc  Procedures: none  Antibiotics: Vancomycin 8/26 > 9/2 Zosyn 8/26 > 9/2 Levaquin 9/2 >  DVT prophylaxis: SQ heparin   Objective: Blood pressure 138/76, pulse 39, temperature 97.8 F (36.6 C), temperature source Oral, resp. rate 23, height '5\' 9"'$  (1.753 m), weight 96.8 kg (213 lb 6.5 oz), SpO2 95 %.  Intake/Output Summary (Last 24 hours) at 04/16/15 1216 Last data filed at 04/16/15 0700  Gross per 24 hour  Intake    250 ml  Output   2400 ml  Net  -2150 ml   Exam: General: Much more alert - no acute respiratory distress  Lungs: poor air movement throughout all fields - coarse crackles right lung to mid  zones - no active wheeze Cardiovascular: Regular rate and rhythm without murmur   Abdomen: Nontender, nondistended, soft, bowel sounds positive, no rebound, no ascites, no appreciable mass Extremities: No significant cyanosis, clubbing, edema bilateral lower extremities  Data Reviewed: Basic Metabolic  Panel:  Recent Labs Lab 04/09/15 1636 04/11/15 0616 04/12/15 0500 04/13/15 0349 04/15/15 0219  NA 137 138 138 137 141  K 4.3 4.4 4.4 4.3 3.9  CL 101 97* 98* 95* 92*  CO2 29 33* 33* 35* 43*  GLUCOSE 104* 104* 122* 116* 150*  BUN 22* '15 12 17 '$ 21*  CREATININE 0.94 0.88 0.81 0.83 0.84  CALCIUM 9.3 8.6* 8.8* 8.5* 8.6*    CBC:  Recent Labs Lab 04/09/15 1636 04/11/15 0616 04/12/15 0500 04/13/15 0349  WBC 12.9* 9.7 8.9 8.2  HGB 14.1 13.9 13.9 13.5  HCT 44.2 45.2 45.4 45.1  MCV 94.8 99.1 99.6 100.0  PLT 176 160 181 174    Liver Function Tests:  Recent Labs Lab 04/13/15 0349 04/15/15 0219  AST 23 28  ALT 28 29  ALKPHOS 79 76  BILITOT 1.2 1.0  PROT 6.7 6.3*  ALBUMIN 2.4* 2.5*    CBG:  Recent Labs Lab 04/15/15 0743 04/15/15 1257 04/15/15 1749 04/15/15 2213 04/16/15 0811  GLUCAP 108* 144* 121* 129* 91    Recent Results (from the past 240 hour(s))  Blood Culture (routine x 2)     Status: None   Collection Time: 04/09/15  7:49 PM  Result Value Ref Range Status   Specimen Description BLOOD RIGHT ARM  Final   Special Requests BOTTLES DRAWN AEROBIC AND ANAEROBIC 5CC  Final   Culture NO GROWTH 5 DAYS  Final   Report Status 04/14/2015 FINAL  Final  Blood Culture (routine x 2)     Status: None   Collection Time: 04/09/15  7:56 PM  Result Value Ref Range Status   Specimen Description BLOOD RIGHT HAND  Final   Special Requests BOTTLES DRAWN AEROBIC ONLY 3CC  Final   Culture NO GROWTH 5 DAYS  Final   Report Status 04/14/2015 FINAL  Final  Urine culture     Status: None   Collection Time: 04/09/15  8:27 PM  Result Value Ref Range Status   Specimen Description URINE, CLEAN CATCH  Final   Special Requests Normal  Final   Culture MULTIPLE SPECIES PRESENT, SUGGEST RECOLLECTION  Final   Report Status 04/12/2015 FINAL  Final  Culture, blood (routine x 2) Call MD if unable to obtain prior to antibiotics being given     Status: None   Collection Time: 04/09/15 11:55  PM  Result Value Ref Range Status   Specimen Description BLOOD LEFT HAND  Final   Special Requests BOTTLES DRAWN AEROBIC AND ANAEROBIC 5CC  Final   Culture NO GROWTH 5 DAYS  Final   Report Status 04/15/2015 FINAL  Final  Culture, blood (routine x 2) Call MD if unable to obtain prior to antibiotics being given     Status: None   Collection Time: 04/10/15 12:01 AM  Result Value Ref Range Status   Specimen Description BLOOD RIGHT HAND  Final   Special Requests BOTTLES DRAWN AEROBIC AND ANAEROBIC 5CC  Final   Culture NO GROWTH 5 DAYS  Final   Report Status 04/15/2015 FINAL  Final  MRSA PCR Screening     Status: None   Collection Time: 04/10/15 12:09 AM  Result Value Ref Range Status   MRSA by PCR NEGATIVE NEGATIVE Final  Comment:        The GeneXpert MRSA Assay (FDA approved for NASAL specimens only), is one component of a comprehensive MRSA colonization surveillance program. It is not intended to diagnose MRSA infection nor to guide or monitor treatment for MRSA infections.   Stat Gram stain     Status: None   Collection Time: 04/13/15 11:05 AM  Result Value Ref Range Status   Specimen Description SPUTUM  Final   Special Requests NONE  Final   Gram Stain   Final    ABUNDANT WBC PRESENT, PREDOMINANTLY PMN NO ORGANISMS SEEN    Report Status 04/13/2015 FINAL  Final  Culture, expectorated sputum-assessment     Status: None   Collection Time: 04/13/15 11:32 AM  Result Value Ref Range Status   Specimen Description   Final   Special Requests NONE  Final   Sputum evaluation   Final    THIS SPECIMEN IS ACCEPTABLE. RESPIRATORY CULTURE REPORT TO FOLLOW.   Report Status 04/13/2015 FINAL  Final  Culture, respiratory (NON-Expectorated)     Status: None   Collection Time: 04/13/15 11:32 AM  Result Value Ref Range Status   Specimen Description SPUTUM  Final   Special Requests NONE  Final   Gram Stain   Final    FEW WBC PRESENT, PREDOMINANTLY MONONUCLEAR RARE SQUAMOUS EPITHELIAL  CELLS PRESENT NO ORGANISMS SEEN Performed at [facility]    Culture   Final    MODERATE STENOTROPHOMONAS MALTOPHILIA Performed at [facility]    Report Status 04/16/2015 FINAL  Final   Organism ID, Bacteria STENOTROPHOMONAS MALTOPHILIA  Final      Susceptibility   Stenotrophomonas maltophilia - MIC*    TRIMETH/SULFA <=20 SENSITIVE Sensitive     LEVOFLOXACIN 0.5 SENSITIVE Sensitive     * MODERATE STENOTROPHOMONAS MALTOPHILIA     Studies:   Recent x-ray studies have been reviewed in detail by the Attending Physician  Scheduled Meds:  Scheduled Meds: . budesonide-formoterol  2 puff Inhalation BID  . feeding supplement (ENSURE ENLIVE)  237 mL Oral BID BM  . guaiFENesin  600 mg Oral BID  . heparin  5,000 Units Subcutaneous 3 times per day  . insulin aspart  0-9 Units Subcutaneous TID WC  . ipratropium-albuterol  3 mL Nebulization QID  . latanoprost  1 drop Both Eyes QHS  . methylPREDNISolone (SOLU-MEDROL) injection  60 mg Intravenous Q24H  . piperacillin-tazobactam (ZOSYN)  IV  3.375 g Intravenous Q8H  . triamcinolone  1 application Topical [id number]  . vancomycin  750 mg Intravenous Q12H    Time spent on care of this patient: 35 mins   Arlind Klingerman T , MD   [facility] Office  [phone] Pager - Text Page per Shea Evans as per below:  On-Call/Text Page:      [email or web address]      password [email or web address]  If 7PM-7AM, please contact night-coverage [email or web address] Password [email or web address] 04/16/2015, 12:16 PM   LOS: 7 days

## 2015-04-16 NOTE — Progress Notes (Signed)
ANTIBIOTIC CONSULT NOTE - FOLLOW UP  Pharmacy Consult for levaquin Indication: PNA  No Known Allergies  Patient Measurements: Height: '5\' 9"'$  (175.3 cm) Weight: 213 lb 6.5 oz (96.8 kg) IBW/kg (Calculated) : 70.7  Vital Signs: Temp: 98.4 F (36.9 C) (09/02 1300) Temp Source: Oral (09/02 1300) BP: 127/78 mmHg (09/02 1541) Pulse Rate: 49 (09/02 1541) Intake/Output from previous day: 09/01 0701 - 09/02 0700 In: 520 [P.O.:120; IV Piggyback:400] Out: 2400 [Urine:2400] Intake/Output from this shift: Total I/O In: 500 [P.O.:300; IV Piggyback:200] Out: 275 [Urine:275]  Labs:  Recent Labs  04/15/15 0219  CREATININE 0.84   Estimated Creatinine Clearance: 64.4 mL/min (by C-G formula based on Cr of 0.84). No results for input(s): VANCOTROUGH, VANCOPEAK, VANCORANDOM, GENTTROUGH, GENTPEAK, GENTRANDOM, TOBRATROUGH, TOBRAPEAK, TOBRARND, AMIKACINPEAK, AMIKACINTROU, AMIKACIN in the last 72 hours.   Microbiology: Recent Results (from the past 720 hour(s))  Blood Culture (routine x 2)     Status: None   Collection Time: 04/09/15  7:49 PM  Result Value Ref Range Status   Specimen Description BLOOD RIGHT ARM  Final   Special Requests BOTTLES DRAWN AEROBIC AND ANAEROBIC 5CC  Final   Culture NO GROWTH 5 DAYS  Final   Report Status 04/14/2015 FINAL  Final  Blood Culture (routine x 2)     Status: None   Collection Time: 04/09/15  7:56 PM  Result Value Ref Range Status   Specimen Description BLOOD RIGHT HAND  Final   Special Requests BOTTLES DRAWN AEROBIC ONLY 3CC  Final   Culture NO GROWTH 5 DAYS  Final   Report Status 04/14/2015 FINAL  Final  Urine culture     Status: None   Collection Time: 04/09/15  8:27 PM  Result Value Ref Range Status   Specimen Description URINE, CLEAN CATCH  Final   Special Requests Normal  Final   Culture MULTIPLE SPECIES PRESENT, SUGGEST RECOLLECTION  Final   Report Status 04/12/2015 FINAL  Final  Culture, blood (routine x 2) Call MD if unable to obtain prior  to antibiotics being given     Status: None   Collection Time: 04/09/15 11:55 PM  Result Value Ref Range Status   Specimen Description BLOOD LEFT HAND  Final   Special Requests BOTTLES DRAWN AEROBIC AND ANAEROBIC 5CC  Final   Culture NO GROWTH 5 DAYS  Final   Report Status 04/15/2015 FINAL  Final  Culture, blood (routine x 2) Call MD if unable to obtain prior to antibiotics being given     Status: None   Collection Time: 04/10/15 12:01 AM  Result Value Ref Range Status   Specimen Description BLOOD RIGHT HAND  Final   Special Requests BOTTLES DRAWN AEROBIC AND ANAEROBIC 5CC  Final   Culture NO GROWTH 5 DAYS  Final   Report Status 04/15/2015 FINAL  Final  MRSA PCR Screening     Status: None   Collection Time: 04/10/15 12:09 AM  Result Value Ref Range Status   MRSA by PCR NEGATIVE NEGATIVE Final    Comment:        The GeneXpert MRSA Assay (FDA approved for NASAL specimens only), is one component of a comprehensive MRSA colonization surveillance program. It is not intended to diagnose MRSA infection nor to guide or monitor treatment for MRSA infections.   Stat Gram stain     Status: None   Collection Time: 04/13/15 11:05 AM  Result Value Ref Range Status   Specimen Description SPUTUM  Final   Special Requests NONE  Final  Gram Stain   Final    ABUNDANT WBC PRESENT, PREDOMINANTLY PMN NO ORGANISMS SEEN    Report Status 04/13/2015 FINAL  Final  Culture, expectorated sputum-assessment     Status: None   Collection Time: 04/13/15 11:32 AM  Result Value Ref Range Status   Specimen Description   Final   Special Requests NONE  Final   Sputum evaluation   Final    THIS SPECIMEN IS ACCEPTABLE. RESPIRATORY CULTURE REPORT TO FOLLOW.   Report Status 04/13/2015 FINAL  Final  Culture, respiratory (NON-Expectorated)     Status: None   Collection Time: 04/13/15 11:32 AM  Result Value Ref Range Status   Specimen Description SPUTUM  Final   Special Requests NONE  Final   Gram Stain    Final    FEW WBC PRESENT, PREDOMINANTLY MONONUCLEAR RARE SQUAMOUS EPITHELIAL CELLS PRESENT NO ORGANISMS SEEN Performed at [place]   Final    MODERATE STENOTROPHOMONAS MALTOPHILIA Performed at [facility]    Report Status 04/16/2015 FINAL  Final   Organism ID, Bacteria STENOTROPHOMONAS MALTOPHILIA  Final      Susceptibility   Stenotrophomonas maltophilia - MIC*    TRIMETH/SULFA <=20 SENSITIVE Sensitive     LEVOFLOXACIN 0.5 SENSITIVE Sensitive     * MODERATE STENOTROPHOMONAS MALTOPHILIA    Anti-infectives    Start     Dose/Rate Route Frequency Ordered Stop   04/10/15 1000  vancomycin (VANCOCIN) IVPB 750 mg/150 ml premix  Status:  Discontinued     750 mg 150 mL/hr over 60 Minutes Intravenous Every 12 hours 04/09/15 2334 04/16/15 1644   04/10/15 0400  piperacillin-tazobactam (ZOSYN) IVPB 3.375 g  Status:  Discontinued     3.375 g 12.5 mL/hr over 240 Minutes Intravenous Every 8 hours 04/09/15 2334 04/16/15 1644   04/09/15 2115  piperacillin-tazobactam (ZOSYN) IVPB 3.375 g  Status:  Discontinued     3.375 g 12.5 mL/hr over 240 Minutes Intravenous  Once 04/09/15 2102 04/09/15 2102   04/09/15 2115  piperacillin-tazobactam (ZOSYN) IVPB 3.375 g     3.375 g 100 mL/hr over 30 Minutes Intravenous  Once 04/09/15 2103 04/09/15 2306   04/09/15 1930  vancomycin (VANCOCIN) 2,000 mg in sodium chloride 0.9 % 500 mL IVPB     2,000 mg 250 mL/hr over 120 Minutes Intravenous  Once 04/09/15 1916 04/09/15 2257   04/09/15 1930  piperacillin-tazobactam (ZOSYN) IVPB 3.375 g  Status:  Discontinued     3.375 g 150 mL/hr over 30 Minutes Intravenous  Once 04/09/15 1916 04/09/15 2101      Assessment: 81 yo male with HCAP on vancomycin/zosyn and now noted with stenotrophomonas to switch to levaquin.  Patient is afebrile, SCr= 0.84 and CrCl ~ 65.   levaquin 9/2 >> Vanc 8/26>> 9/2 Zosyn 8/26>> 9/2  8/27 MRSA - NEG 8/26 BLood - NGF 8/27 BC>> neg 8/26 Urine -  negative 8/30 sputum>> moderate Stenotrophomonas maltophilia (sens to levaquin and bactrim)   Plan:   -Levaquin '750mg'$  IV q24hr -Will follow renal function and clinical progress  Hildred Laser, Pharm D 04/16/2015 4:50 PM

## 2015-04-17 ENCOUNTER — Inpatient Hospital Stay (HOSPITAL_COMMUNITY): Payer: Medicare Other

## 2015-04-17 LAB — GLUCOSE, CAPILLARY
GLUCOSE-CAPILLARY: 94 mg/dL (ref 65–99)
GLUCOSE-CAPILLARY: 97 mg/dL (ref 65–99)
GLUCOSE-CAPILLARY: 146 mg/dL — AB (ref 65–99)
Glucose-Capillary: 169 mg/dL — ABNORMAL HIGH (ref 65–99)
Glucose-Capillary: 115 mg/dL — ABNORMAL HIGH (ref 65–99)

## 2015-04-17 LAB — BLOOD GAS, ARTERIAL
ACID-BASE EXCESS: 16.5 mmol/L — AB (ref 0.0–2.0)
BICARBONATE: 42.5 meq/L — AB (ref 20.0–24.0)
Drawn by: 105521
O2 CONTENT: 5 L/min
O2 SAT: 96.5 %
PATIENT TEMPERATURE: 98.6
PCO2 ART: 73.4 mmHg — AB (ref 35.0–45.0)
PO2 ART: 91.9 mmHg (ref 80.0–100.0)
TCO2: 44.8 mmol/L (ref 0–100)
pH, Arterial: 7.381 (ref 7.350–7.450)

## 2015-04-17 LAB — URINE CULTURE: Culture: NO GROWTH

## 2015-04-17 NOTE — Progress Notes (Signed)
Pt is currently off bi-pap and on a 6L . RT will continue to monitor.

## 2015-04-17 NOTE — Progress Notes (Signed)
[place] TEAM 1 - Stepdown/ICU TEAM Progress Note  Darrell Livingston KWI:[id number] DOB: 03-30-1921 DOA: 04/09/2015 PCP: Mathews Argyle, MD  Admit HPI / Brief Narrative: 81 y.o. male [employer] [place] [employer] with hx of COPD, DM type II, and LUL lung cancer who presented to [facility] 04/09/2015 with main concern of several weeks duration of progressively worsening productive cough of yellow sputum, occasionally blood tinged, dyspnea with exertion and at rest, subjective fevers, chills, malaise, and poor oral intake.   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 He was subsequently diagnosed with a right middle lung non-small cell carcinoma in May 2010 which was also treated with stereotactic body radiotherapy in July 2010. The patient obtains routine follow-up at the [facility] in [place]. CT imaging on 03/01/2015 demonstrated 2 left upper lung findings. A 7 mm cavitary upper lung nodule increased to 1.5 cm with a new solid 1.1 cm cavitating left upper lung lesion. These 2 lesions were felt to represent neoplasms and the patient is currently been referred closer to home for potential management of synchronous stage I left upper lung cancers.  HPI/Subjective: The patient is alert and interactive today.  The family reports however he is having periods throughout the day when he's been more somnolent.  He denies chest pain fevers chills nausea or vomiting.  His conversation is improving but he does still continue to pause for significant periods during our interactions.  Assessment/Plan:  Sepsis secondary to HCAP bilateral lower lobes - Change to Levaquin for better coverage of stenotrophomonas noted in respiratory culture - clinically improving slowly  Acute hypoxic respiratory failure - acute hypercarbic respiratory failure - secondary to  HCAP imposed on known COPD - acute decline earlier in stay likely due to increased stress upon pulmonary system of baseline severe COPD, lung cancer, and now new pneumonia - responded well to BiPAP and has been been liberated to nasal cannula - he continues to slowly improve - Recheck ABG this afternoon given family reports the patient is intermittently lethargic  Altered mental status - This appears to represent toxic metabolic encephalopathy/critical-care delirium - MRI unrevealing for acute changes - mental status appears to be stable at present but as noted above family has appreciated some lethargy today therefore ABG is pending  Dysphagia - SLP has recommended dysphagia 1 diet with nectar thick liquids - hopefully this is simply due to weakness and will improve as he continues to improve  COPD  -No clear bronchospastic exacerbation  Moderate PCM - nutritionist consulted   DM type 2 - CBG currently well-controlled  Hypertension - Blood pressure is trending upward - monitor without adjusting medical therapy today   Cancer of upper lobe of left lung  Obesity  - Body mass index is 31.5 kg/(m^2).  Code Status: NO CODE BLUE Family Communication: Spoke with son and multiple extended family members at the bedside Disposition Plan: SDU  Consultants: Rad Onc  Procedures: none  Antibiotics: Vancomycin 8/26 > 9/2 Zosyn 8/26 > 9/2 Levaquin 9/2 >  DVT prophylaxis: lovenox  Objective: Blood pressure 164/97, pulse 68, temperature 98 F (36.7 C), temperature source Oral, resp. rate 19, height '5\' 9"'$  (1.753 m), weight 96.8 kg (213 lb 6.5 oz), SpO2 99 %.  Intake/Output Summary (Last 24 hours) at 04/17/15 1615 Last data filed at 04/17/15 1606  Gross per 24 hour  Intake    240 ml  Output   1425 ml  Net  -1185 ml   Exam: General: alert - no acute respiratory distress  Lungs: poor air movement throughout all fields - coarse crackles right lung - no active  wheeze Cardiovascular: Regular rate and rhythm without murmur   Abdomen: Nontender, nondistended, soft, bowel sounds positive, no rebound, no ascites, no appreciable mass Extremities: No significant cyanosis, clubbing, or edema bilateral lower extremities  Data Reviewed: Basic Metabolic Panel:  Recent Labs Lab 04/11/15 0616 04/12/15 0500 04/13/15 0349 04/15/15 0219  NA 138 138 137 141  K 4.4 4.4 4.3 3.9  CL 97* 98* 95* 92*  CO2 33* 33* 35* 43*  GLUCOSE 104* 122* 116* 150*  BUN '15 12 17 '$ 21*  CREATININE 0.88 0.81 0.83 0.84  CALCIUM 8.6* 8.8* 8.5* 8.6*    CBC:  Recent Labs Lab 04/11/15 0616 04/12/15 0500 04/13/15 0349  WBC 9.7 8.9 8.2  HGB 13.9 13.9 13.5  HCT 45.2 45.4 45.1  MCV 99.1 99.6 100.0  PLT 160 181 174    Liver Function Tests:  Recent Labs Lab 04/13/15 0349 04/15/15 0219  AST 23 28  ALT 28 29  ALKPHOS 79 76  BILITOT 1.2 1.0  PROT 6.7 6.3*  ALBUMIN 2.4* 2.5*    CBG:  Recent Labs Lab 04/16/15 1642 04/16/15 2208 04/17/15 0735 04/17/15 1156 04/17/15 1519  GLUCAP 176* 139* 94 97 146*    Recent Results (from the past 240 hour(s))  Blood Culture (routine x 2)     Status: None   Collection Time: 04/09/15  7:49 PM  Result Value Ref Range Status   Specimen Description BLOOD RIGHT ARM  Final   Special Requests BOTTLES DRAWN AEROBIC AND ANAEROBIC 5CC  Final   Culture NO GROWTH 5 DAYS  Final   Report Status 04/14/2015 FINAL  Final  Blood Culture (routine x 2)     Status: None   Collection Time: 04/09/15  7:56 PM  Result Value Ref Range Status   Specimen Description BLOOD RIGHT HAND  Final   Special Requests BOTTLES DRAWN AEROBIC ONLY 3CC  Final   Culture NO GROWTH 5 DAYS  Final   Report Status 04/14/2015 FINAL  Final  Urine culture     Status: None   Collection Time: 04/09/15  8:27 PM  Result Value Ref Range Status   Specimen Description URINE, CLEAN CATCH  Final   Special Requests Normal  Final   Culture MULTIPLE SPECIES PRESENT, SUGGEST  RECOLLECTION  Final   Report Status 04/12/2015 FINAL  Final  Culture, blood (routine x 2) Call MD if unable to obtain prior to antibiotics being given     Status: None   Collection Time: 04/09/15 11:55 PM  Result Value Ref Range Status   Specimen Description BLOOD LEFT HAND  Final   Special Requests BOTTLES DRAWN AEROBIC AND ANAEROBIC 5CC  Final   Culture NO GROWTH 5 DAYS  Final   Report Status 04/15/2015 FINAL  Final  Culture, blood (routine x 2) Call MD if unable to obtain prior to antibiotics being given     Status: None   Collection Time: 04/10/15 12:01 AM  Result Value Ref Range Status   Specimen Description BLOOD RIGHT HAND  Final   Special Requests BOTTLES DRAWN AEROBIC AND ANAEROBIC 5CC  Final   Culture NO GROWTH 5 DAYS  Final   Report Status 04/15/2015 FINAL  Final  MRSA PCR Screening     Status: None  Collection Time: 04/10/15 12:09 AM  Result Value Ref Range Status   MRSA by PCR NEGATIVE NEGATIVE Final    Comment:        The GeneXpert MRSA Assay (FDA approved for NASAL specimens only), is one component of a comprehensive MRSA colonization surveillance program. It is not intended to diagnose MRSA infection nor to guide or monitor treatment for MRSA infections.   Stat Gram stain     Status: None   Collection Time: 04/13/15 11:05 AM  Result Value Ref Range Status   Specimen Description SPUTUM  Final   Special Requests NONE  Final   Gram Stain   Final    ABUNDANT WBC PRESENT, PREDOMINANTLY PMN NO ORGANISMS SEEN    Report Status 04/13/2015 FINAL  Final  Culture, expectorated sputum-assessment     Status: None   Collection Time: 04/13/15 11:32 AM  Result Value Ref Range Status   Specimen Description   Final   Special Requests NONE  Final   Sputum evaluation   Final    THIS SPECIMEN IS ACCEPTABLE. RESPIRATORY CULTURE REPORT TO FOLLOW.   Report Status 04/13/2015 FINAL  Final  Culture, respiratory (NON-Expectorated)     Status: None   Collection Time: 04/13/15  11:32 AM  Result Value Ref Range Status   Specimen Description SPUTUM  Final   Special Requests NONE  Final   Gram Stain   Final    FEW WBC PRESENT, PREDOMINANTLY MONONUCLEAR RARE SQUAMOUS EPITHELIAL CELLS PRESENT NO ORGANISMS SEEN Performed at [facility]    Culture   Final    MODERATE STENOTROPHOMONAS MALTOPHILIA Performed at [facility]    Report Status 04/16/2015 FINAL  Final   Organism ID, Bacteria STENOTROPHOMONAS MALTOPHILIA  Final      Susceptibility   Stenotrophomonas maltophilia - MIC*    TRIMETH/SULFA <=20 SENSITIVE Sensitive     LEVOFLOXACIN 0.5 SENSITIVE Sensitive     * MODERATE STENOTROPHOMONAS MALTOPHILIA  Culture, Urine     Status: None   Collection Time: 04/15/15 10:22 PM  Result Value Ref Range Status   Specimen Description URINE, CATHETERIZED  Final   Special Requests NONE  Final   Culture NO GROWTH 1 DAY  Final   Report Status 04/17/2015 FINAL  Final     Studies:   Recent x-ray studies have been reviewed in detail by the Attending Physician  Scheduled Meds:  Scheduled Meds: . budesonide-formoterol  2 puff Inhalation BID  . enoxaparin (LOVENOX) injection  40 mg Subcutaneous Q24H  . feeding supplement (ENSURE ENLIVE)  237 mL Oral BID BM  . guaiFENesin  600 mg Oral BID  . insulin aspart  0-9 Units Subcutaneous TID WC  . ipratropium-albuterol  3 mL Nebulization QID  . latanoprost  1 drop Both Eyes QHS  . levofloxacin (LEVAQUIN) IV  750 mg Intravenous Q24H  . methylPREDNISolone (SOLU-MEDROL) injection  60 mg Intravenous Q24H  . senna-docusate  1 tablet Oral BID  . triamcinolone  1 application Topical [id number]    Time spent on care of this patient: 35 mins   Thaily Hackworth T , MD   [facility] Office  [phone] Pager - Text Page per Shea Evans as per below:  On-Call/Text Page:      [email or web address]      password [email or web address]  If 7PM-7AM, please contact night-coverage [email or web address] Password [email or web address] 04/17/2015, 4:15 PM   LOS: 8 days

## 2015-04-18 LAB — CBC
HCT: 43.1 % (ref 39.0–52.0)
Hemoglobin: 13.2 g/dL (ref 13.0–17.0)
MCH: 29.5 pg (ref 26.0–34.0)
MCHC: 30.6 g/dL (ref 30.0–36.0)
MCV: 96.4 fL (ref 78.0–100.0)
PLATELETS: 182 10*3/uL (ref 150–400)
RBC: 4.47 MIL/uL (ref 4.22–5.81)
RDW: 15.6 % — AB (ref 11.5–15.5)
WBC: 8 10*3/uL (ref 4.0–10.5)

## 2015-04-18 LAB — COMPREHENSIVE METABOLIC PANEL
ALT: 34 U/L (ref 17–63)
AST: 24 U/L (ref 15–41)
Albumin: 2.2 g/dL — ABNORMAL LOW (ref 3.5–5.0)
Alkaline Phosphatase: 65 U/L (ref 38–126)
Anion gap: 5 (ref 5–15)
BILIRUBIN TOTAL: 0.6 mg/dL (ref 0.3–1.2)
BUN: 24 mg/dL — AB (ref 6–20)
CO2: 42 mmol/L — ABNORMAL HIGH (ref 22–32)
CREATININE: 0.72 mg/dL (ref 0.61–1.24)
Calcium: 8.7 mg/dL — ABNORMAL LOW (ref 8.9–10.3)
Chloride: 93 mmol/L — ABNORMAL LOW (ref 101–111)
Glucose, Bld: 104 mg/dL — ABNORMAL HIGH (ref 65–99)
Potassium: 4.3 mmol/L (ref 3.5–5.1)
Sodium: 140 mmol/L (ref 135–145)
TOTAL PROTEIN: 5.8 g/dL — AB (ref 6.5–8.1)

## 2015-04-18 LAB — GLUCOSE, CAPILLARY
GLUCOSE-CAPILLARY: 131 mg/dL — AB (ref 65–99)
Glucose-Capillary: 97 mg/dL (ref 65–99)
Glucose-Capillary: 106 mg/dL — ABNORMAL HIGH (ref 65–99)
Glucose-Capillary: 118 mg/dL — ABNORMAL HIGH (ref 65–99)

## 2015-04-18 MED ORDER — PHENOL 1.4 % MT LIQD
1.0000 | Freq: Two times a day (BID) | OROMUCOSAL | Status: DC | PRN
  Filled 2015-04-18: qty 177

## 2015-04-18 NOTE — Progress Notes (Signed)
[place] TEAM 1 - Stepdown/ICU TEAM Progress Note  Darrell Livingston YIR:[id number] DOB: Dec 31, 1920 DOA: 04/09/2015 PCP: Mathews Argyle, MD  Admit HPI / Brief Narrative: 81 y.o. male [employer] [place] [employer] with hx of COPD, DM type II, and LUL lung cancer who presented to [facility] 04/09/2015 with main concern of several weeks duration of progressively worsening productive cough of yellow sputum, occasionally blood tinged, dyspnea with exertion and at rest, subjective fevers, chills, malaise, and poor oral intake.   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 He was subsequently diagnosed with a right middle lung non-small cell carcinoma in May 2010 which was also treated with stereotactic body radiotherapy in July 2010. The patient obtains routine follow-up at the [facility] in [place]. CT imaging on 03/01/2015 demonstrated 2 left upper lung findings. A 7 mm cavitary upper lung nodule increased to 1.5 cm with a new solid 1.1 cm cavitating left upper lung lesion. These 2 lesions were felt to represent neoplasms and the patient is currently been referred closer to home for potential management of synchronous stage I left upper lung cancers.  HPI/Subjective: The patient continues to improve slowly.  He has no new complaints today.  He denies chest pain nausea vomiting or abdominal pain.  He has been up in a chair today for 3-4 hours.  Assessment/Plan:  Sepsis secondary to HCAP bilateral lower lobes - clinically improving slowly - continue antibiotic therapy  Acute hypoxic respiratory failure - acute hypercarbic respiratory failure - secondary to HCAP imposed on known COPD - responded well to BiPAP and has been been liberated to nasal cannula - he continues to slowly improve  Altered mental status - This appears to represent toxic  metabolic encephalopathy/critical-care delirium - MRI unrevealing for acute changes - mental status appears to be stable   Dysphagia - SLP has recommended dysphagia 1 diet with nectar thick liquids - hopefully this is simply due to weakness and will improve as he continues to improve  COPD  -No clear bronchospastic exacerbation  Moderate PCM - nutritionist consulted   DM type 2 - CBG currently well-controlled  Hypertension - Blood pressure reasonably controlled for clinical situation at this time  Cancer of upper lobe of left lung  Obesity  - Body mass index is 31.5 kg/(m^2).  Code Status: NO CODE BLUE Family Communication: Spoke with son at the bedside Disposition Plan: SDU  Consultants: Rad Onc  Procedures: none  Antibiotics: Vancomycin 8/26 > 9/2 Zosyn 8/26 > 9/2 Levaquin 9/2 >  DVT prophylaxis: lovenox  Objective: Blood pressure 109/42, pulse 87, temperature 97.8 F (36.6 C), temperature source Oral, resp. rate 23, height '5\' 9"'$  (1.753 m), weight 95.3 kg (210 lb 1.6 oz), SpO2 98 %.  Intake/Output Summary (Last 24 hours) at 04/18/15 1604 Last data filed at 04/18/15 0414  Gross per 24 hour  Intake    300 ml  Output    575 ml  Net   -275 ml   Exam: General: no acute respiratory distress  Lungs: poor air movement throughout all fields - coarse crackles right lung - no active wheeze - stable exam Cardiovascular: Regular rate and rhythm without murmur   Abdomen: Nontender, nondistended, soft, bowel sounds positive, no rebound, no ascites, no appreciable mass Extremities: No significant cyanosis, clubbing, edema bilateral lower extremities  Data Reviewed: Basic Metabolic Panel:  Recent Labs Lab 04/12/15 0500 04/13/15 0349 04/15/15 0219 04/18/15 0545  NA 138 137 141 140  K 4.4 4.3 3.9 4.3  CL 98* 95* 92* 93*  CO2 33* 35* 43* 42*  GLUCOSE 122* 116* 150* 104*  BUN 12 17 21* 24*  CREATININE 0.81 0.83 0.84 0.72  CALCIUM 8.8* 8.5* 8.6* 8.7*     CBC:  Recent Labs Lab 04/12/15 0500 04/13/15 0349 04/18/15 0545  WBC 8.9 8.2 8.0  HGB 13.9 13.5 13.2  HCT 45.4 45.1 43.1  MCV 99.6 100.0 96.4  PLT 181 174 182    Liver Function Tests:  Recent Labs Lab 04/13/15 0349 04/15/15 0219 04/18/15 0545  AST '23 28 24  '$ ALT 28 29 34  ALKPHOS 79 76 65  BILITOT 1.2 1.0 0.6  PROT 6.7 6.3* 5.8*  ALBUMIN 2.4* 2.5* 2.2*    CBG:  Recent Labs Lab 04/17/15 1823 04/17/15 2156 04/18/15 0738 04/18/15 1256 04/18/15 1601  GLUCAP 169* 115* 97 106* 118*    Recent Results (from the past 240 hour(s))  Blood Culture (routine x 2)     Status: None   Collection Time: 04/09/15  7:49 PM  Result Value Ref Range Status   Specimen Description BLOOD RIGHT ARM  Final   Special Requests BOTTLES DRAWN AEROBIC AND ANAEROBIC 5CC  Final   Culture NO GROWTH 5 DAYS  Final   Report Status 04/14/2015 FINAL  Final  Blood Culture (routine x 2)     Status: None   Collection Time: 04/09/15  7:56 PM  Result Value Ref Range Status   Specimen Description BLOOD RIGHT HAND  Final   Special Requests BOTTLES DRAWN AEROBIC ONLY 3CC  Final   Culture NO GROWTH 5 DAYS  Final   Report Status 04/14/2015 FINAL  Final  Urine culture     Status: None   Collection Time: 04/09/15  8:27 PM  Result Value Ref Range Status   Specimen Description URINE, CLEAN CATCH  Final   Special Requests Normal  Final   Culture MULTIPLE SPECIES PRESENT, SUGGEST RECOLLECTION  Final   Report Status 04/12/2015 FINAL  Final  Culture, blood (routine x 2) Call MD if unable to obtain prior to antibiotics being given     Status: None   Collection Time: 04/09/15 11:55 PM  Result Value Ref Range Status   Specimen Description BLOOD LEFT HAND  Final   Special Requests BOTTLES DRAWN AEROBIC AND ANAEROBIC 5CC  Final   Culture NO GROWTH 5 DAYS  Final   Report Status 04/15/2015 FINAL  Final  Culture, blood (routine x 2) Call MD if unable to obtain prior to antibiotics being given     Status: None    Collection Time: 04/10/15 12:01 AM  Result Value Ref Range Status   Specimen Description BLOOD RIGHT HAND  Final   Special Requests BOTTLES DRAWN AEROBIC AND ANAEROBIC 5CC  Final   Culture NO GROWTH 5 DAYS  Final   Report Status 04/15/2015 FINAL  Final  MRSA PCR Screening     Status: None   Collection Time: 04/10/15 12:09 AM  Result Value Ref Range Status   MRSA by PCR NEGATIVE NEGATIVE Final    Comment:        The GeneXpert MRSA Assay (FDA approved for NASAL specimens only), is one component of a comprehensive MRSA colonization surveillance program. It is not intended to diagnose MRSA infection nor to guide or monitor treatment for MRSA infections.   Stat Gram stain  Status: None   Collection Time: 04/13/15 11:05 AM  Result Value Ref Range Status   Specimen Description SPUTUM  Final   Special Requests NONE  Final   Gram Stain   Final    ABUNDANT WBC PRESENT, PREDOMINANTLY PMN NO ORGANISMS SEEN    Report Status 04/13/2015 FINAL  Final  Culture, expectorated sputum-assessment     Status: None   Collection Time: 04/13/15 11:32 AM  Result Value Ref Range Status   Specimen Description   Final   Special Requests NONE  Final   Sputum evaluation   Final    THIS SPECIMEN IS ACCEPTABLE. RESPIRATORY CULTURE REPORT TO FOLLOW.   Report Status 04/13/2015 FINAL  Final  Culture, respiratory (NON-Expectorated)     Status: None   Collection Time: 04/13/15 11:32 AM  Result Value Ref Range Status   Specimen Description SPUTUM  Final   Special Requests NONE  Final   Gram Stain   Final    FEW WBC PRESENT, PREDOMINANTLY MONONUCLEAR RARE SQUAMOUS EPITHELIAL CELLS PRESENT NO ORGANISMS SEEN Performed at [facility]    Culture   Final    MODERATE STENOTROPHOMONAS MALTOPHILIA Performed at [facility]    Report Status 04/16/2015 FINAL  Final   Organism ID, Bacteria STENOTROPHOMONAS MALTOPHILIA  Final      Susceptibility   Stenotrophomonas maltophilia - MIC*     TRIMETH/SULFA <=20 SENSITIVE Sensitive     LEVOFLOXACIN 0.5 SENSITIVE Sensitive     * MODERATE STENOTROPHOMONAS MALTOPHILIA  Culture, Urine     Status: None   Collection Time: 04/15/15 10:22 PM  Result Value Ref Range Status   Specimen Description URINE, CATHETERIZED  Final   Special Requests NONE  Final   Culture NO GROWTH 1 DAY  Final   Report Status 04/17/2015 FINAL  Final     Studies:   Recent x-ray studies have been reviewed in detail by the Attending Physician  Scheduled Meds:  Scheduled Meds: . budesonide-formoterol  2 puff Inhalation BID  . enoxaparin (LOVENOX) injection  40 mg Subcutaneous Q24H  . feeding supplement (ENSURE ENLIVE)  237 mL Oral BID BM  . guaiFENesin  600 mg Oral BID  . insulin aspart  0-9 Units Subcutaneous TID WC  . ipratropium-albuterol  3 mL Nebulization QID  . latanoprost  1 drop Both Eyes QHS  . levofloxacin (LEVAQUIN) IV  750 mg Intravenous Q24H  . senna-docusate  1 tablet Oral BID  . triamcinolone  1 application Topical [id number]    Time spent on care of this patient: 25 mins   [facility] T , MD   [facility] Office  [phone] Pager - Text Page per Shea Evans as per below:  On-Call/Text Page:      [email or web address]      password [email or web address]  If 7PM-7AM, please contact night-coverage [email or web address] Password [email or web address] 04/18/2015, 4:04 PM   LOS: 9 days

## 2015-04-19 LAB — GLUCOSE, CAPILLARY
GLUCOSE-CAPILLARY: 126 mg/dL — AB (ref 65–99)
GLUCOSE-CAPILLARY: 122 mg/dL — AB (ref 65–99)
Glucose-Capillary: 104 mg/dL — ABNORMAL HIGH (ref 65–99)
Glucose-Capillary: 117 mg/dL — ABNORMAL HIGH (ref 65–99)
Glucose-Capillary: 128 mg/dL — ABNORMAL HIGH (ref 65–99)

## 2015-04-19 NOTE — Progress Notes (Signed)
ANTIBIOTIC CONSULT NOTE - FOLLOW UP  Pharmacy Consult for Levaquin Indication: PNA  No Known Allergies  Patient Measurements: Height: '5\' 9"'$  (175.3 cm) Weight: 208 lb 8.9 oz (94.6 kg) IBW/kg (Calculated) : 70.7  Vital Signs: Temp: 97.9 F (36.6 C) (09/05 0800) Temp Source: Oral (09/05 0800) BP: 159/72 mmHg (09/05 0800) Pulse Rate: 56 (09/05 0800) Intake/Output from previous day: 09/04 0701 - 09/05 0700 In: -  Out: 575 [Urine:575] Intake/Output from this shift: Total I/O In: 120 [P.O.:120] Out: -   Labs:  Recent Labs  04/18/15 0545  WBC 8.0  HGB 13.2  PLT 182  CREATININE 0.72   Estimated Creatinine Clearance: 66.9 mL/min (by C-G formula based on Cr of 0.72). No results for input(s): VANCOTROUGH, VANCOPEAK, VANCORANDOM, GENTTROUGH, GENTPEAK, GENTRANDOM, TOBRATROUGH, TOBRAPEAK, TOBRARND, AMIKACINPEAK, AMIKACINTROU, AMIKACIN in the last 72 hours.   Microbiology: Recent Results (from the past 720 hour(s))  Blood Culture (routine x 2)     Status: None   Collection Time: 04/09/15  7:49 PM  Result Value Ref Range Status   Specimen Description BLOOD RIGHT ARM  Final   Special Requests BOTTLES DRAWN AEROBIC AND ANAEROBIC 5CC  Final   Culture NO GROWTH 5 DAYS  Final   Report Status 04/14/2015 FINAL  Final  Blood Culture (routine x 2)     Status: None   Collection Time: 04/09/15  7:56 PM  Result Value Ref Range Status   Specimen Description BLOOD RIGHT HAND  Final   Special Requests BOTTLES DRAWN AEROBIC ONLY 3CC  Final   Culture NO GROWTH 5 DAYS  Final   Report Status 04/14/2015 FINAL  Final  Urine culture     Status: None   Collection Time: 04/09/15  8:27 PM  Result Value Ref Range Status   Specimen Description URINE, CLEAN CATCH  Final   Special Requests Normal  Final   Culture MULTIPLE SPECIES PRESENT, SUGGEST RECOLLECTION  Final   Report Status 04/12/2015 FINAL  Final  Culture, blood (routine x 2) Call MD if unable to obtain prior to antibiotics being given      Status: None   Collection Time: 04/09/15 11:55 PM  Result Value Ref Range Status   Specimen Description BLOOD LEFT HAND  Final   Special Requests BOTTLES DRAWN AEROBIC AND ANAEROBIC 5CC  Final   Culture NO GROWTH 5 DAYS  Final   Report Status 04/15/2015 FINAL  Final  Culture, blood (routine x 2) Call MD if unable to obtain prior to antibiotics being given     Status: None   Collection Time: 04/10/15 12:01 AM  Result Value Ref Range Status   Specimen Description BLOOD RIGHT HAND  Final   Special Requests BOTTLES DRAWN AEROBIC AND ANAEROBIC 5CC  Final   Culture NO GROWTH 5 DAYS  Final   Report Status 04/15/2015 FINAL  Final  MRSA PCR Screening     Status: None   Collection Time: 04/10/15 12:09 AM  Result Value Ref Range Status   MRSA by PCR NEGATIVE NEGATIVE Final    Comment:        The GeneXpert MRSA Assay (FDA approved for NASAL specimens only), is one component of a comprehensive MRSA colonization surveillance program. It is not intended to diagnose MRSA infection nor to guide or monitor treatment for MRSA infections.   Stat Gram stain     Status: None   Collection Time: 04/13/15 11:05 AM  Result Value Ref Range Status   Specimen Description SPUTUM  Final   Special  Requests NONE  Final   Gram Stain   Final    ABUNDANT WBC PRESENT, PREDOMINANTLY PMN NO ORGANISMS SEEN    Report Status 04/13/2015 FINAL  Final  Culture, expectorated sputum-assessment     Status: None   Collection Time: 04/13/15 11:32 AM  Result Value Ref Range Status   Specimen Description   Final   Special Requests NONE  Final   Sputum evaluation   Final    THIS SPECIMEN IS ACCEPTABLE. RESPIRATORY CULTURE REPORT TO FOLLOW.   Report Status 04/13/2015 FINAL  Final  Culture, respiratory (NON-Expectorated)     Status: None   Collection Time: 04/13/15 11:32 AM  Result Value Ref Range Status   Specimen Description SPUTUM  Final   Special Requests NONE  Final   Gram Stain   Final    FEW WBC PRESENT,  PREDOMINANTLY MONONUCLEAR RARE SQUAMOUS EPITHELIAL CELLS PRESENT NO ORGANISMS SEEN Performed at [facility]    Culture   Final    MODERATE STENOTROPHOMONAS MALTOPHILIA Performed at [facility]    Report Status 04/16/2015 FINAL  Final   Organism ID, Bacteria STENOTROPHOMONAS MALTOPHILIA  Final      Susceptibility   Stenotrophomonas maltophilia - MIC*    TRIMETH/SULFA <=20 SENSITIVE Sensitive     LEVOFLOXACIN 0.5 SENSITIVE Sensitive     * MODERATE STENOTROPHOMONAS MALTOPHILIA  Culture, Urine     Status: None   Collection Time: 04/15/15 10:22 PM  Result Value Ref Range Status   Specimen Description URINE, CATHETERIZED  Final   Special Requests NONE  Final   Culture NO GROWTH 1 DAY  Final   Report Status 04/17/2015 FINAL  Final    Anti-infectives    Start     Dose/Rate Route Frequency Ordered Stop   04/16/15 1800  levofloxacin (LEVAQUIN) IVPB 750 mg     750 mg 100 mL/hr over 90 Minutes Intravenous Every 24 hours 04/16/15 1652     04/10/15 1000  vancomycin (VANCOCIN) IVPB 750 mg/150 ml premix  Status:  Discontinued     750 mg 150 mL/hr over 60 Minutes Intravenous Every 12 hours 04/09/15 2334 04/16/15 1644   04/10/15 0400  piperacillin-tazobactam (ZOSYN) IVPB 3.375 g  Status:  Discontinued     3.375 g 12.5 mL/hr over 240 Minutes Intravenous Every 8 hours 04/09/15 2334 04/16/15 1644   04/09/15 2115  piperacillin-tazobactam (ZOSYN) IVPB 3.375 g  Status:  Discontinued     3.375 g 12.5 mL/hr over 240 Minutes Intravenous  Once 04/09/15 2102 04/09/15 2102   04/09/15 2115  piperacillin-tazobactam (ZOSYN) IVPB 3.375 g     3.375 g 100 mL/hr over 30 Minutes Intravenous  Once 04/09/15 2103 04/09/15 2306   04/09/15 1930  vancomycin (VANCOCIN) 2,000 mg in sodium chloride 0.9 % 500 mL IVPB     2,000 mg 250 mL/hr over 120 Minutes Intravenous  Once 04/09/15 1916 04/09/15 2257   04/09/15 1930  piperacillin-tazobactam (ZOSYN) IVPB 3.375 g  Status:  Discontinued     3.375  g 150 mL/hr over 30 Minutes Intravenous  Once 04/09/15 1916 04/09/15 2101      Assessment: 89 YOM who continues on Levaquin for stenotrophomonas PNA. Afebrile, WBC wnl. Renal function remains stable - SCr 0.72, CrCl~50 ml/min. Dose remains appropriate   levaquin 9/2 >> Vanc 8/26>> 9/2 Zosyn 8/26>> 9/2  8/27 MRSA - NEG 8/26 BLood - NGF 8/27 BC>> neg 8/26 Urine - negative 8/30 sputum>> moderate Stenotrophomonas maltophilia (sens to levaquin and bactrim)   Plan:  1. Continue Levaquin '750mg'$  IV q24hr 2. Will continue to follow renal function, culture results, LOT, and antibiotic de-escalation plans   Alycia Rossetti, PharmD, BCPS Clinical Pharmacist Pager: [phone] 04/19/2015 10:30 AM

## 2015-04-19 NOTE — Progress Notes (Signed)
[facility] TEAM 1 - Stepdown/ICU TEAM Progress Note  Darrell Livingston IEP:[id number] DOB: 1920/11/15 DOA: 04/09/2015 PCP: Mathews Argyle, MD  Admit HPI / Brief Narrative: 81 y.o. male [employer] [place] [employer] with hx of COPD, DM type II, and LUL lung cancer who presented to [facility] 04/09/2015 with main concern of several weeks duration of progressively worsening productive cough of yellow sputum, occasionally blood tinged, dyspnea with exertion and at rest, subjective fevers, chills, malaise, and poor oral intake.   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 He was subsequently diagnosed with a right middle lung non-small cell carcinoma in May 2010 which was also treated with stereotactic body radiotherapy in July 2010. The patient obtains routine follow-up at the [facility] in [place]. CT imaging on 03/01/2015 demonstrated 2 left upper lung findings. A 7 mm cavitary upper lung nodule increased to 1.5 cm with a new solid 1.1 cm cavitating left upper lung lesion. These 2 lesions were felt to represent neoplasms and the patient is currently been referred closer to home for potential management of synchronous stage I left upper lung cancers.  HPI/Subjective: The pt is sitting up in a bedside chair.  He has no complaints, and is asking about going home.  He denies cp, sob at rest, or abdom pain.    Assessment/Plan:  Sepsis secondary to HCAP bilateral lower lobes - clinically improving slowly - continue antibiotic therapy  Acute hypoxic respiratory failure - acute hypercarbic respiratory failure - secondary to HCAP on known COPD - responded well to BiPAP and has been liberated to nasal cannula - continues to slowly improve  Altered mental status - This appears to represent toxic metabolic encephalopathy/critical-care delirium - MRI  unrevealing for acute changes - mental status appears to be stable and essentially back to baseline   Dysphagia - SLP has recommended dysphagia 1 diet with nectar thick liquids - hopefully this is simply due to weakness and will improve as he continues to improve  COPD  - No clear bronchospastic exacerbation  Moderate PCM - nutritionist consulted   DM type 2 - CBG currently well-controlled  Hypertension - Blood pressure reasonably controlled for clinical situation at this time  Cancer of upper lobe of left lung  Obesity  - Body mass index is 31.5 kg/(m^2).  Code Status: NO CODE BLUE Family Communication: no family present at time of exam today  Disposition Plan: SDU  Consultants: Rad Onc  Procedures: none  Antibiotics: Vancomycin 8/26 > 9/2 Zosyn 8/26 > 9/2 Levaquin 9/2 >  DVT prophylaxis: lovenox  Objective: Blood pressure 110/46, pulse 47, temperature 97.9 F (36.6 C), temperature source Oral, resp. rate 22, height '5\' 9"'$  (1.753 m), weight 94.6 kg (208 lb 8.9 oz), SpO2 95 %.  Intake/Output Summary (Last 24 hours) at 04/19/15 1705 Last data filed at 04/19/15 0900  Gross per 24 hour  Intake    120 ml  Output    575 ml  Net   -455 ml   Exam: General: no acute respiratory distress  Lungs: coarse crackles right lung - no active wheeze  Cardiovascular: Regular rate and rhythm without murmur   Abdomen: Nontender, nondistended, soft, bowel sounds positive, no rebound, no ascites, no appreciable mass Extremities: No significant cyanosis, or clubbing; trace edema bilateral lower extremities  Data Reviewed: Basic Metabolic Panel:  Recent Labs Lab  04/13/15 0349 04/15/15 0219 04/18/15 0545  NA 137 141 140  K 4.3 3.9 4.3  CL 95* 92* 93*  CO2 35* 43* 42*  GLUCOSE 116* 150* 104*  BUN 17 21* 24*  CREATININE 0.83 0.84 0.72  CALCIUM 8.5* 8.6* 8.7*    CBC:  Recent Labs Lab 04/13/15 0349 04/18/15 0545  WBC 8.2 8.0  HGB 13.5 13.2  HCT 45.1 43.1  MCV  100.0 96.4  PLT 174 182    Liver Function Tests:  Recent Labs Lab 04/13/15 0349 04/15/15 0219 04/18/15 0545  AST '23 28 24  '$ ALT 28 29 34  ALKPHOS 79 76 65  BILITOT 1.2 1.0 0.6  PROT 6.7 6.3* 5.8*  ALBUMIN 2.4* 2.5* 2.2*    CBG:  Recent Labs Lab 04/18/15 1601 04/18/15 2057 04/19/15 0003 04/19/15 0849 04/19/15 1204  GLUCAP 118* 131* 128* 104* 126*    Recent Results (from the past 240 hour(s))  Blood Culture (routine x 2)     Status: None   Collection Time: 04/09/15  7:49 PM  Result Value Ref Range Status   Specimen Description BLOOD RIGHT ARM  Final   Special Requests BOTTLES DRAWN AEROBIC AND ANAEROBIC 5CC  Final   Culture NO GROWTH 5 DAYS  Final   Report Status 04/14/2015 FINAL  Final  Blood Culture (routine x 2)     Status: None   Collection Time: 04/09/15  7:56 PM  Result Value Ref Range Status   Specimen Description BLOOD RIGHT HAND  Final   Special Requests BOTTLES DRAWN AEROBIC ONLY 3CC  Final   Culture NO GROWTH 5 DAYS  Final   Report Status 04/14/2015 FINAL  Final  Urine culture     Status: None   Collection Time: 04/09/15  8:27 PM  Result Value Ref Range Status   Specimen Description URINE, CLEAN CATCH  Final   Special Requests Normal  Final   Culture MULTIPLE SPECIES PRESENT, SUGGEST RECOLLECTION  Final   Report Status 04/12/2015 FINAL  Final  Culture, blood (routine x 2) Call MD if unable to obtain prior to antibiotics being given     Status: None   Collection Time: 04/09/15 11:55 PM  Result Value Ref Range Status   Specimen Description BLOOD LEFT HAND  Final   Special Requests BOTTLES DRAWN AEROBIC AND ANAEROBIC 5CC  Final   Culture NO GROWTH 5 DAYS  Final   Report Status 04/15/2015 FINAL  Final  Culture, blood (routine x 2) Call MD if unable to obtain prior to antibiotics being given     Status: None   Collection Time: 04/10/15 12:01 AM  Result Value Ref Range Status   Specimen Description BLOOD RIGHT HAND  Final   Special Requests BOTTLES  DRAWN AEROBIC AND ANAEROBIC 5CC  Final   Culture NO GROWTH 5 DAYS  Final   Report Status 04/15/2015 FINAL  Final  MRSA PCR Screening     Status: None   Collection Time: 04/10/15 12:09 AM  Result Value Ref Range Status   MRSA by PCR NEGATIVE NEGATIVE Final    Comment:        The GeneXpert MRSA Assay (FDA approved for NASAL specimens only), is one component of a comprehensive MRSA colonization surveillance program. It is not intended to diagnose MRSA infection nor to guide or monitor treatment for MRSA infections.   Stat Gram stain     Status: None   Collection Time: 04/13/15 11:05 AM  Result Value Ref Range Status   Specimen Description  SPUTUM  Final   Special Requests NONE  Final   Gram Stain   Final    ABUNDANT WBC PRESENT, PREDOMINANTLY PMN NO ORGANISMS SEEN    Report Status 04/13/2015 FINAL  Final  Culture, expectorated sputum-assessment     Status: None   Collection Time: 04/13/15 11:32 AM  Result Value Ref Range Status   Specimen Description   Final   Special Requests NONE  Final   Sputum evaluation   Final    THIS SPECIMEN IS ACCEPTABLE. RESPIRATORY CULTURE REPORT TO FOLLOW.   Report Status 04/13/2015 FINAL  Final  Culture, respiratory (NON-Expectorated)     Status: None   Collection Time: 04/13/15 11:32 AM  Result Value Ref Range Status   Specimen Description SPUTUM  Final   Special Requests NONE  Final   Gram Stain   Final    FEW WBC PRESENT, PREDOMINANTLY MONONUCLEAR RARE SQUAMOUS EPITHELIAL CELLS PRESENT NO ORGANISMS SEEN Performed at [facility]    Culture   Final    MODERATE STENOTROPHOMONAS MALTOPHILIA Performed at [facility]    Report Status 04/16/2015 FINAL  Final   Organism ID, Bacteria STENOTROPHOMONAS MALTOPHILIA  Final      Susceptibility   Stenotrophomonas maltophilia - MIC*    TRIMETH/SULFA <=20 SENSITIVE Sensitive     LEVOFLOXACIN 0.5 SENSITIVE Sensitive     * MODERATE STENOTROPHOMONAS MALTOPHILIA  Culture, Urine      Status: None   Collection Time: 04/15/15 10:22 PM  Result Value Ref Range Status   Specimen Description URINE, CATHETERIZED  Final   Special Requests NONE  Final   Culture NO GROWTH 1 DAY  Final   Report Status 04/17/2015 FINAL  Final     Studies:   Recent x-ray studies have been reviewed in detail by the Attending Physician  Scheduled Meds:  Scheduled Meds: . budesonide-formoterol  2 puff Inhalation BID  . enoxaparin (LOVENOX) injection  40 mg Subcutaneous Q24H  . feeding supplement (ENSURE ENLIVE)  237 mL Oral BID BM  . guaiFENesin  600 mg Oral BID  . insulin aspart  0-9 Units Subcutaneous TID WC  . ipratropium-albuterol  3 mL Nebulization QID  . latanoprost  1 drop Both Eyes QHS  . levofloxacin (LEVAQUIN) IV  750 mg Intravenous Q24H  . senna-docusate  1 tablet Oral BID  . triamcinolone  1 application Topical [id number]    Time spent on care of this patient: 25 mins   [facility] , MD   [facility] Office  [phone] Pager - Text Page per Shea Evans as per below:  On-Call/Text Page:      [email or web address]      password [email or web address]  If 7PM-7AM, please contact night-coverage [email or web address] Password [email or web address] 04/19/2015, 5:05 PM   LOS: 10 days

## 2015-04-19 NOTE — Evaluation (Signed)
Physical Therapy Evaluation Patient Details Name: Darrell Livingston MRN: [id number] DOB: Nov 29, 1920 Today's Date: 04/19/2015   History of Present Illness  Darrell Livingston is a 81 y.o. male who presents to the ED with c/o SOB and cough. Symptoms have onset insidiously over the past couple of weeks with worsening. Patient has h/o lung CA, very small tumor burden with LUL nodules that is being managed by oncology . Sepsis with HCAP.  Pt initially evaluated 04/12/15 by PT and then MD discontinued order due to worsening respiratory status.  PT eval re-ordered 04/18/15.  Clinical Impression  Pt admitted with above diagnosis. Pt currently with functional limitations due to the deficits listed below (see PT Problem List). Pt re-evaluated and is doing better than initial evaluation as he is more alert.  Still very deconditioned and would benefit from NHP for therapy prior to d/c home.  Son and pt agree.  Will follow acutely.   Pt will benefit from skilled PT to increase their independence and safety with mobility to allow discharge to the venue listed below.      Follow Up Recommendations SNF;Supervision/Assistance - 24 hour    Equipment Recommendations  Other (comment) (TBD with pt progression)    Recommendations for Other Services       Precautions / Restrictions Precautions Precautions: Fall Restrictions Weight Bearing Restrictions: No      Mobility  Bed Mobility Overal bed mobility: Needs Assistance Bed Mobility: Supine to Sit     Supine to sit: Mod assist     General bed mobility comments: in chair on arrival  Transfers Overall transfer level: Needs assistance Equipment used: Ambulation equipment used Transfers: Sit to/from Stand Sit to Stand: Min assist;+2 physical assistance;+2 safety/equipment         General transfer comment: Pt in low chair.  Initially attempted to use Stedy for pt to get up out of chair.  Pt could not achieve sit to stand to [place] secondary to  couldn't power up and Stedy too narrow.  Obtained Clarise Cruz plus and pt was able to stand with assist of Clarise Cruz plus and stood ~1 minute in the machine.  PT and tech placed pillows under pt so he would not be so low in chair and educated nurse in how to use the Clarise Cruz plus so she could use it later in day to get pt back to bed.  Pt wanted to stay up in chair.  Pt was not fully upright in [place] plus but was close to fully upright with only slightly flexed posture at hips, knees and trunk.  Pt enjoyed standing and was happy he was able to with the help of machine.    Ambulation/Gait                Stairs            Wheelchair Mobility    Modified Rankin (Stroke Patients Only) Modified Rankin (Stroke Patients Only) Pre-Morbid Rankin Score: No significant disability Modified Rankin: Severe disability     Balance Overall balance assessment: Needs assistance;History of Falls Sitting-balance support: Feet supported;No upper extremity supported Sitting balance-Leahy Scale: Fair   Postural control: Posterior lean Standing balance support: Bilateral upper extremity supported;During functional activity Standing balance-Leahy Scale: Poor Standing balance comment: Needed Clarise Cruz plus to stand with bil Ue support and use of Sara plus.  Pertinent Vitals/Pain Pain Assessment: No/denies pain  VSS    Home Living Family/patient expects to be discharged to:: Skilled nursing facility Living Arrangements: Children Available Help at Discharge: Available PRN/intermittently Type of Home: House Home Access: Stairs to enter Entrance Stairs-Rails: None Entrance Stairs-Number of Steps: 2 Home Layout: Two level;Able to live on main level with bedroom/bathroom Home Equipment: Cane - quad Additional Comments: Pt states he has a RW available as well as shower chair    Prior Function Level of Independence: Independent with assistive device(s)         Comments: pt  was living alone, using cane, driving to the grocery store, son spent the night approximately half the time. Pt at baseline has slow speech and report of several recent falls. Performing ADLs on his own     Hand Dominance   Dominant Hand: Left    Extremity/Trunk Assessment   Upper Extremity Assessment: Defer to OT evaluation           Lower Extremity Assessment: RLE deficits/detail;LLE deficits/detail RLE Deficits / Details: grossly 3-/5 LLE Deficits / Details: grossly 3-/5  Cervical / Trunk Assessment: Kyphotic  Communication   Communication: HOH  Cognition Arousal/Alertness: Awake/alert Behavior During Therapy: Flat affect Overall Cognitive Status: Within Functional Limits for tasks assessed Area of Impairment: Following commands       Following Commands: Follows one step commands with increased time            General Comments      Exercises General Exercises - Lower Extremity Ankle Circles/Pumps: AROM;Both;Seated;5 reps Long Arc Quad: AROM;Both;5 reps;Seated      Assessment/Plan    PT Assessment Patient needs continued PT services  PT Diagnosis Difficulty walking;Generalized weakness   PT Problem List Decreased strength;Decreased range of motion;Decreased activity tolerance;Decreased balance;Decreased mobility;Decreased coordination;Decreased safety awareness;Decreased knowledge of use of DME;Decreased cognition;Obesity  PT Treatment Interventions DME instruction;Gait training;Functional mobility training;Therapeutic activities;Therapeutic exercise;Balance training;Patient/family education;Neuromuscular re-education;Cognitive remediation   PT Goals (Current goals can be found in the Care Plan section) Acute Rehab PT Goals Patient Stated Goal: to get better PT Goal Formulation: With patient/family Time For Goal Achievement: 05/03/15 Potential to Achieve Goals: Fair    Frequency Min 3X/week   Barriers to discharge Decreased caregiver support       Co-evaluation               End of Session Equipment Utilized During Treatment: Gait belt;Oxygen Activity Tolerance: Patient limited by fatigue Patient left: in chair;with call bell/phone within reach;with family/visitor present Nurse Communication: Mobility status;Need for lift equipment (nursing educated on use of Clarise Cruz plus)         Time: [phone] PT Time Calculation (min) (ACUTE ONLY): 23 min   Charges:   PT Evaluation $Initial PT Evaluation Tier I: 1 Procedure PT Treatments $Therapeutic Activity: 8-22 mins   PT G CodesDenice Paradise 2015/04/23, 2:04 PM Kylen Schliep,PT Acute Rehabilitation [phone] (pager)

## 2015-04-19 NOTE — Evaluation (Signed)
Occupational Therapy Evaluation Patient Details Name: Darrell Livingston MRN: [id number] DOB: February 23, 1921 Today's Date: 04/19/2015    History of Present Illness Darrell Livingston is a 81 y.o. male who presents to the ED with c/o SOB and cough. Symptoms have onset insidiously over the past couple of weeks with worsening. Patient has h/o lung CA, very small tumor burden with LUL nodules that is being managed by oncology . Sepsis with HCAP   Clinical Impression   This 81 yo male admitted with above presents to acute OT with decreased balance, decreased mobility, obesity, decreased cognition all affecting his ability to care for himself at an mod I/independent level as he was pta with BADLs. He will benefit from acute OT with follow up OT at SNF to get to a S level or better.    Follow Up Recommendations  SNF    Equipment Recommendations   (TBD at next venue)       Precautions / Restrictions Precautions Precautions: Fall Restrictions Weight Bearing Restrictions: No      Mobility Bed Mobility Overal bed mobility: Needs Assistance Bed Mobility: Supine to Sit     Supine to sit: Mod assist        Transfers Overall transfer level: Needs assistance Equipment used: 1 person hand held assist Transfers: Sit to/from Stand Sit to Stand: Mod assist;From elevated surface              Balance Overall balance assessment: Needs assistance Sitting-balance support: Feet supported;No upper extremity supported Sitting balance-Leahy Scale: Fair     Standing balance support: Single extremity supported Standing balance-Leahy Scale: Poor                              ADL Overall ADL's : Needs assistance/impaired Eating/Feeding: Set up;Supervision/ safety;Sitting   Grooming: Set up;Supervision/safety;Sitting   Upper Body Bathing: Moderate assistance;Sitting   Lower Body Bathing: Maximal assistance (with Mod A sit<>stand from higher surfaces)   Upper Body Dressing :  Moderate assistance;Sitting   Lower Body Dressing: Total assistance (with Mod A sit<>stand from higher surfaces)   Toilet Transfer: Moderate assistance;Stand-pivot (bed>recliner going to his left)   Toileting- Clothing Manipulation and Hygiene: Total assistance (with Mod A sit<>stand from higher surfaces)               Vision Additional Comments: Wears glasses for reading          Pertinent Vitals/Pain Pain Assessment: No/denies pain     Hand Dominance Left   Extremity/Trunk Assessment Upper Extremity Assessment Upper Extremity Assessment: Generalized weakness           Communication Communication Communication: HOH   Cognition Arousal/Alertness: Awake/alert Behavior During Therapy: Flat affect Overall Cognitive Status: Impaired/Different from baseline Area of Impairment: Following commands       Following Commands: Follows one step commands with increased time                      Home Living Family/patient expects to be discharged to:: Skilled nursing facility                                        Prior Functioning/Environment Level of Independence: Independent with assistive device(s)        Comments: pt was living alone, using cane, driving to the grocery store, son spent the night  approximately half the time. Pt at baseline has slow speech and report of several recent falls. Performing ADLs on his own    OT Diagnosis: Generalized weakness;Cognitive deficits   OT Problem List: Decreased strength;Impaired balance (sitting and/or standing);Decreased activity tolerance;Decreased knowledge of use of DME or AE;Decreased cognition;Obesity   OT Treatment/Interventions: Self-care/ADL training;Patient/family education;Balance training;Therapeutic activities;DME and/or AE instruction;Cognitive remediation/compensation    OT Goals(Current goals can be found in the care plan section) Acute Rehab OT Goals Patient Stated Goal: to get out  of bed OT Goal Formulation: With patient Time For Goal Achievement: 05/03/15 Potential to Achieve Goals: Good  OT Frequency: Min 2X/week   Barriers to D/C: Decreased caregiver support             End of Session Equipment Utilized During Treatment: Gait belt Nurse Communication: Mobility status (chair alarm pad under him, but no box in room (RN and NT aware))  Activity Tolerance: Patient limited by fatigue Patient left: in chair;with call bell/phone within reach   Time: 0826-0847 OT Time Calculation (min): 21 min Charges:  OT General Charges $OT Visit: 1 Procedure OT Evaluation $Initial OT Evaluation Tier I: 1 Procedure  Almon Register [id number] 04/19/2015, 11:04 AM

## 2015-04-20 DIAGNOSIS — R4182 Altered mental status, unspecified: Secondary | ICD-10-CM | POA: Diagnosis present

## 2015-04-20 DIAGNOSIS — J441 Chronic obstructive pulmonary disease with (acute) exacerbation: Secondary | ICD-10-CM | POA: Diagnosis present

## 2015-04-20 DIAGNOSIS — E119 Type 2 diabetes mellitus without complications: Secondary | ICD-10-CM | POA: Diagnosis present

## 2015-04-20 LAB — GLUCOSE, CAPILLARY
GLUCOSE-CAPILLARY: 104 mg/dL — AB (ref 65–99)
GLUCOSE-CAPILLARY: 87 mg/dL (ref 65–99)
GLUCOSE-CAPILLARY: 146 mg/dL — AB (ref 65–99)
Glucose-Capillary: 102 mg/dL — ABNORMAL HIGH (ref 65–99)

## 2015-04-20 MED ORDER — ALBUTEROL SULFATE (2.5 MG/3ML) 0.083% IN NEBU: 2.5000 mg | INHALATION_SOLUTION | RESPIRATORY_TRACT | Status: AC | PRN

## 2015-04-20 MED ORDER — GUAIFENESIN ER 600 MG PO TB12: 600.0000 mg | ORAL_TABLET | Freq: Two times a day (BID) | ORAL | Status: AC

## 2015-04-20 MED ORDER — ENSURE ENLIVE PO LIQD: 237.0000 mL | Freq: Two times a day (BID) | ORAL | Status: AC

## 2015-04-20 MED ORDER — RESOURCE THICKENUP CLEAR PO POWD: 1.0000 | ORAL | Status: DC | PRN

## 2015-04-20 NOTE — Progress Notes (Signed)
Speech Language Pathology Treatment: Dysphagia  Patient Details Name: Darrell Livingston MRN: [id number] DOB: 12/02/1920 Today's Date: 04/20/2015 Time: 4098-1191 SLP Time Calculation (min) (ACUTE ONLY): 17 min  Assessment / Plan / Recommendation Clinical Impression  Treatment focused on safety with diet/liquids during part of breakfast. Pt with immediate and delayed coughing throughout session, deep congested cough. Red circles around eyes (sometimes seen with pna). Suspect he is continuing to have some degree of aspiration with current diet/liquids. MD listened to lungs and reports rhonchi. He states he does not mind the puree food. Although pt is 81, SLP will downgrade liquids to honey consistency to decrease aspiration risk and decrease coughing as it is fatiguing for pt. Continue Dys 1, downgrade honey thick.    HPI Other Pertinent Information: 81 y.o. male who presents to the ED with c/o SOB and cough.CXR Stable multi focal pneumonia and pleural effusions since yesterday, emphysema. PMH: COPD, lung cancer, DM, HTN.   Pertinent Vitals Pain Assessment: No/denies pain  SLP Plan  Continue with current plan of care    Recommendations Diet recommendations: Dysphagia 1 (puree);Nectar-thick liquid Liquids provided via: Cup;No straw Medication Administration: Crushed with puree Supervision: Patient able to self feed;Full supervision/cueing for compensatory strategies Compensations: Slow rate;Small sips/bites Postural Changes and/or Swallow Maneuvers: Seated upright 90 degrees              Oral Care Recommendations: Oral care BID Follow up Recommendations:  (TBD) Plan: Continue with current plan of care    GO     Houston Siren 04/20/2015, 9:10 AM  Orbie Pyo Colvin Caroli.Ed [employer] [phone]

## 2015-04-20 NOTE — Progress Notes (Signed)
CSW sent out updates to facilities- CSW put updated bed offers by pt chart- RN informed and will give when family member in the room.  CSW called pt son, Darrell Livingston, and informed of likely DC tomorrow (9/7)- no bed choice at this time- they will call CSW when decision is made.  CSW will continue to follow.  Domenica Reamer, [place] Social Worker [phone]

## 2015-04-20 NOTE — Care Management Important Message (Signed)
Important Message  Patient Details  Name: SELAH ZELMAN MRN: [id number] Date of Birth: 1921-06-20   Medicare Important Message Given:  Yes-fourth notification given    Nathen May 04/20/2015, 11:42 AMImportant Message  Patient Details  Name: PACEY ALTIZER MRN: [id number] Date of Birth: May 18, 1921   Medicare Important Message Given:  Yes-fourth notification given    Nathen May 04/20/2015, 11:42 AM

## 2015-04-20 NOTE — Discharge Summary (Addendum)
Physician Discharge Summary  Darrell Livingston HYW:[id number] DOB: 13-Feb-1921 DOA: 04/09/2015  PCP: Mathews Argyle, MD  Admit date: 04/09/2015 Discharge date: 04/21/2015  Time spent: >30 minutes (note amended/updated per Dr. Thereasa Solo 04/21/2015)  Recommendations for Outpatient Follow-up:  -follow Oxygen saturations - adjust between 3-6L as needed to keep sats 88% or > -PT/OT with goal to correct deconditioned status   Hospital Course by Active Problem:  AMS  -appeared to represent toxic metabolic encephalopathy/critical-care delirium w/ multifactorial contributions from HCAP and hypercapnia -MRI brain negative acute stroke - mental status most likely new baseline, but hopefully will have some slight improvement at SNF -Follow-up with Dr. Lajean Manes as needed / and with attending MD at rehab facility   Sepsis secondary to HCAP bilateral lower lobes -Completed 7 day course of antibiotics  -Continue flutter valve  -Continue Symbicort 80-4 0.5 g BID -Continue albuterol nebulizer PRN  Acute hypoxic respiratory failure - acute hypercarbic respiratory failure - secondary to HCAP imposed on known COPD - responded well to BiPAP and has been liberated to nasal cannula - continues to slowly improve -Significantly improved - may require ongoing titration of O2 between 3-6L depending upon activity   COPD -well compensated at time of d/c w/ no active wheeze   Dysphagia - SLP has recommended dysphagia 1 diet with nectar thick liquids - hopefully this is simply due to weakness and will improve as he continues to improve  Moderate malnutrition - nutritionist consulted; continue Ensure BID   DM type 2 controlled -CBG currently well-controlled -8/30 A1c 5.9 -Lipid panel; within ADA guidelines except for low HDL   Hypertension - Blood pressure reasonably controlled at present time  Cancer of upper lobe of left lung -stage IA non-small cell carcinomas of the left upper lung without  biopsy -Dr. Lajean Manes (PCP) will discuss with patient and family if he believes patient can stand more radiation treatment - would then refer back to Dr. Tammi Klippel for radiation oncology  Discharge Diagnoses:  Principal Problem:   HCAP (healthcare-associated pneumonia) Active Problems:   COPD (chronic obstructive pulmonary disease)   DM type 2 (diabetes mellitus, type 2)   Hypertension   Cancer of upper lobe of left lung   Sepsis due to pneumonia   Acute respiratory failure with hypoxia and hypercapnia   COPD exacerbation   Type 2 diabetes mellitus with complication   Essential hypertension   Non-small cell carcinoma of left lung   Disorientation   Respiratory acidosis   Moderate malnutrition   ILD (interstitial lung disease)   Altered mental status   Chronic obstructive pulmonary disease with acute exacerbation   Diabetes type 2, controlled  Discharge Condition: Guarded  Code Status:  NO CODE BLUE  Diet recommendation: Dysphagia 1 nectar thick liquids  Filed Weights   04/11/15 0100 04/18/15 0414 04/19/15 0514  Weight: 96.8 kg (213 lb 6.5 oz) 95.3 kg (210 lb 1.6 oz) 94.6 kg (208 lb 8.9 oz)    History of present illness:  81 y.o. male [employer] [place] [employer] with hx of COPD, DM type II, and LUL lung cancer who presented to [facility] 04/09/2015 with main concern of several weeks duration of progressively worsening productive cough of yellow sputum, occasionally blood tinged, dyspnea with exertion and at rest, subjective fevers, chills, malaise, and poor oral intake.   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 He was subsequently diagnosed with a right middle lung non-small cell carcinoma in May 2010 which was also treated with stereotactic body radiotherapy in July 2010. The patient obtains routine follow-up at the [facility]  in [place]. CT imaging on 03/01/2015 demonstrated 2 left upper lung findings. A 7 mm cavitary upper lung nodule increased to 1.5 cm with a new solid 1.1 cm cavitating left upper lung lesion. These 2 lesions were felt to represent neoplasms and the patient is currently been referred closer to home for potential management of synchronous stage I left upper lung cancers.  Consultants: Rad Onc  Procedure/Significant Events: 9/1 MRI brain;-Remote lacunar infarcts are present posteriorly in the right lentiform nucleus and throughout the left basal ganglia.  -Remote ischemic changes are present in the thalami bilaterally.  -Moderate chronic ischemic changes within the basal ganglia and brainstem.  Antibiotics: Vancomycin 8/26 > 9/2 Zosyn 8/26 > 9/2 Levaquin 9/2 > 9/6   Discharge Exam: Filed Vitals:   04/21/15 0800 04/21/15 0819 04/21/15 1000 04/21/15 1120  BP: 135/77 135/77 114/48   Pulse: 78 88 48   Temp:  98 F (36.7 C)    TempSrc:  Oral    Resp: '21 22 15   '$ Height:      Weight:      SpO2: 100% 96% 85% 94%   General: no acute respiratory distress - alert and conversant though mildly confused Lungs: poor air movement throughout all fields - coarse crackles right lung - no active wheeze - stable exam Cardiovascular: Regular rate and rhythm without murmur- distant HS  Abdomen: Nontender, nondistended, soft, bowel sounds positive, no rebound, no ascites, no appreciable mass Extremities: No significant cyanosis, clubbing, edema bilateral lower extremities  Discharge Instructions:     Discharge Instructions    Increase activity slowly    Complete by:  As directed             Medication List    STOP taking these medications        FUROSEMIDE PO     gabapentin 300 MG capsule  Commonly known as:  NEURONTIN     lisinopril 5 MG tablet  Commonly known as:  PRINIVIL,ZESTRIL     metFORMIN 500 MG tablet  Commonly known as:  GLUCOPHAGE      TAKE these medications         albuterol 108 (90 BASE) MCG/ACT inhaler  Commonly known as:  PROVENTIL HFA;VENTOLIN HFA  Inhale 1-2 puffs into the lungs every 6 (six) hours as needed for wheezing or shortness of breath.     albuterol (2.5 MG/3ML) 0.083% nebulizer solution  Commonly known as:  PROVENTIL  Take 3 mLs (2.5 mg total) by nebulization every 2 (two) hours as needed for wheezing or shortness of breath.     atorvastatin 20 MG tablet  Commonly known as:  LIPITOR  Take 10 mg by mouth daily at 6 PM.     budesonide-formoterol 80-4.5 MCG/ACT inhaler  Commonly known as:  SYMBICORT  Inhale 2 puffs into the lungs 2 (two) times daily.     cholecalciferol 1000 UNITS tablet  Commonly known as:  VITAMIN D  Take 1,000 Units by mouth daily. Take two tabets     feeding supplement (ENSURE ENLIVE) Liqd  Take 237 mLs by mouth 2 (two) times daily between meals.     guaiFENesin 600 MG 12 hr tablet  Commonly known as:  MUCINEX  Take 1 tablet (600 mg total) by mouth 2 (two)  times daily.     latanoprost 0.005 % ophthalmic solution  Commonly known as:  XALATAN  Place 1 drop into both eyes at bedtime.     RESOURCE THICKENUP CLEAR Powd  Take 120 g by mouth as needed (Follow directions on can for proper use).     tiotropium 18 MCG inhalation capsule  Commonly known as:  SPIRIVA  Place 18 mcg into inhaler and inhale daily.     triamcinolone 0.025 % cream  Commonly known as:  KENALOG  Apply 1 application topically daily at 12 noon.       No Known Allergies Follow-up Information    Follow up with Mathews Argyle, MD. Schedule an appointment as soon as possible for a visit in 2 weeks.   Specialty:  Internal Medicine   Why:  Follow-up with Dr. Lajean Manes in 2 weeks HCAP, acute respiratory failure with hypoxia, altered mental status   Contact information:   [address] [place] [address] [phone]       Follow up with Rehab Facility MD of record.   Why:  The attending MD of record at  your rehab facility will attend to you ongoing care while you are undergoing your rehab stay.       Microbiology: Recent Results (from the past 240 hour(s))  Stat Gram stain     Status: None   Collection Time: 04/13/15 11:05 AM  Result Value Ref Range Status   Specimen Description SPUTUM  Final   Special Requests NONE  Final   Gram Stain   Final    ABUNDANT WBC PRESENT, PREDOMINANTLY PMN NO ORGANISMS SEEN    Report Status 04/13/2015 FINAL  Final  Culture, expectorated sputum-assessment     Status: None   Collection Time: 04/13/15 11:32 AM  Result Value Ref Range Status   Specimen Description   Final   Special Requests NONE  Final   Sputum evaluation   Final    THIS SPECIMEN IS ACCEPTABLE. RESPIRATORY CULTURE REPORT TO FOLLOW.   Report Status 04/13/2015 FINAL  Final  Culture, respiratory (NON-Expectorated)     Status: None   Collection Time: 04/13/15 11:32 AM  Result Value Ref Range Status   Specimen Description SPUTUM  Final   Special Requests NONE  Final   Gram Stain   Final    FEW WBC PRESENT, PREDOMINANTLY MONONUCLEAR RARE SQUAMOUS EPITHELIAL CELLS PRESENT NO ORGANISMS SEEN Performed at [facility]    Culture   Final    MODERATE STENOTROPHOMONAS MALTOPHILIA Performed at [facility]    Report Status 04/16/2015 FINAL  Final   Organism ID, Bacteria STENOTROPHOMONAS MALTOPHILIA  Final      Susceptibility   Stenotrophomonas maltophilia - MIC*    TRIMETH/SULFA <=20 SENSITIVE Sensitive     LEVOFLOXACIN 0.5 SENSITIVE Sensitive     * MODERATE STENOTROPHOMONAS MALTOPHILIA  Culture, Urine     Status: None   Collection Time: 04/15/15 10:22 PM  Result Value Ref Range Status   Specimen Description URINE, CATHETERIZED  Final   Special Requests NONE  Final   Culture NO GROWTH 1 DAY  Final   Report Status 04/17/2015 FINAL  Final     Labs: Basic Metabolic Panel:  Recent Labs Lab 04/15/15 0219 04/18/15 0545  NA 141 140  K 3.9 4.3  CL 92* 93*  CO2 43*  42*  GLUCOSE 150* 104*  BUN 21* 24*  CREATININE 0.84 0.72  CALCIUM 8.6* 8.7*   Liver Function Tests:  Recent Labs Lab 04/15/15 0219 04/18/15 0545  AST 28 24  ALT 29 34  ALKPHOS 76 65  BILITOT 1.0 0.6  PROT 6.3* 5.8*  ALBUMIN 2.5* 2.2*   CBC:  Recent Labs Lab 04/18/15 0545  WBC 8.0  HGB 13.2  HCT 43.1  MCV 96.4  PLT 182   CBG:  Recent Labs Lab 04/20/15 0808 04/20/15 1158 04/20/15 1615 04/20/15 2118 04/21/15 0817  GLUCAP 104* 87 146* 102* 88    Signed:  Cherene Altes, MD [facility] For Consults/Admissions - Flow Manager - [phone] Office  [phone]  Contact MD directly via text page:      [email or web address]      password [email or web address]  11:46 AM 04/21/2015

## 2015-04-20 NOTE — Progress Notes (Signed)
Patient bed offers list given to patient's son,per CSW. Patients son to follow up with CSW when he talks to his brothers about placement. C.Tykeshia Tourangeau,RN

## 2015-04-21 LAB — GLUCOSE, CAPILLARY: Glucose-Capillary: 88 mg/dL (ref 65–99)

## 2015-04-21 NOTE — Progress Notes (Signed)
Patient will discharge to [facility] Anticipated discharge date:04/21/15 Family notified: pt son at bedside Transportation by [facility]- scheduled for 12pm  CSW signing off.  Domenica Reamer, [place] Social Worker [phone]

## 2015-04-21 NOTE — Progress Notes (Signed)
EMS arrived to discharge patient to [facility] via ambulance. Son at bedside all patients belongings taken by patients son.C.Zaharah Amir,RN

## 2015-04-21 NOTE — Progress Notes (Signed)
Pt placed on NIV 16/8 rate12, 40% due to low 02 sats. HR91, SP02 97%. Pt tolerating well at present.

## 2015-05-04 ENCOUNTER — Telehealth: Payer: Self-pay | Admitting: Radiation Oncology

## 2015-05-04 NOTE — Telephone Encounter (Signed)
Darrell Livingston, patient's son, returning message left by this RN. He reports his father was hospitalized for one week then, discharged to [facility]. [name] reports his father is participating in rehab and hoping to get stronger but, it is very slow going. Planned to touch base with [name] via phone in two weeks.

## 2015-05-04 NOTE — Telephone Encounter (Signed)
Understand patient was discharged to [facility]. Phoned son, [name], to inquire about his father's status. No answer. Left message requesting return call.

## 2015-05-18 ENCOUNTER — Telehealth: Payer: Self-pay | Admitting: Radiation Oncology

## 2015-05-18 NOTE — Telephone Encounter (Signed)
Opened in error

## 2015-05-18 NOTE — Telephone Encounter (Signed)
Phoned patient's son, Yazen Rosko, to inquire about his Dad's status. [name] reports his Dad will be discharged home soon after a month a [facility] recovering from pneumonia. [name] confirms that once his Dad is home he plans to reach out to our office to schedule a follow up with Dr. Tammi Klippel. Confirmed he has our contact number.

## 2015-06-02 ENCOUNTER — Ambulatory Visit (INDEPENDENT_AMBULATORY_CARE_PROVIDER_SITE_OTHER): Payer: Medicare Other | Admitting: Podiatry

## 2015-06-02 ENCOUNTER — Encounter: Payer: Self-pay | Admitting: Podiatry

## 2015-06-02 DIAGNOSIS — B351 Tinea unguium: Secondary | ICD-10-CM

## 2015-06-02 DIAGNOSIS — M79676 Pain in unspecified toe(s): Secondary | ICD-10-CM

## 2015-06-02 DIAGNOSIS — Q828 Other specified congenital malformations of skin: Secondary | ICD-10-CM

## 2015-06-02 NOTE — Progress Notes (Signed)
Subjective:     Patient ID: Darrell Livingston, male   DOB: Sep 20, 1920, 81 y.o.   MRN: [id number]  HPIThis patient presents to the office for preventive foot care services.  He says his nails are thick and long and painful when he walks.  He also has corns on his toes in addition to callus on his forefoot both feet.  He has difficulty walking and needs to be treated with routine foot care. He is diabetic.   Review of Systems     Objective:   Physical Exam GENERAL APPEARANCE: Alert, conversant. Appropriately groomed. No acute distress.  VASCULAR: Pedal pulses not palpable at 2/4 due to swelling  bilateral.  Capillary refill time is immediate to all digits,   NEUROLOGIC: sensation is intact epicritically and protectively to 5.07 monofilament at 5/5 sites bilateral.  Light touch is intact bilateral, vibratory sensation intact bilateral, achilles tendon reflex is intact bilateral.  MUSCULOSKELETAL: acceptable muscle strength, tone and stability bilateral.  Intrinsic muscluature intact bilateral.  Rectus appearance of foot and digits noted bilateral.   DERMATOLOGIC: skin color, texture, and turgor are within normal limits.  No preulcerative lesions or ulcers  are seen, no interdigital maceration noted.  No open lesions present.  . No drainage noted. Porokeratosis three under right foot.   Severe porokeratosis sub3 left foot.  Corns noted fourth and fifth toe left foot. NAILS  Thick disfigured discolored nails with no paronychia noted.     Assessment:     Onychomycosis   Porokeratosis B/L     Plan:     IE  Debridement and Grinding of Nails B/L.  Debridement of porokeratosis.  RTC 3 months

## 2015-06-08 ENCOUNTER — Telehealth: Payer: Self-pay | Admitting: Radiation Oncology

## 2015-06-08 NOTE — Telephone Encounter (Signed)
Opened in error

## 2015-06-08 NOTE — Telephone Encounter (Signed)
Phoned patient's son, Darrell Livingston, to inquire of status and confirm tomorrow's appointment. Darrell Livingston reports his Dad is stronger but, he does need to reschedule the appointment for tomorrow because there is no one to bring him. Offered to arrange a PET CT first then, make a follow up with Dr. Tammi Klippel. Darrell Livingston understands Enid Derry will contact him with PET CT appointment and follow up appointment with Dr. Tammi Klippel for a date after [place].

## 2015-06-09 ENCOUNTER — Ambulatory Visit: Payer: Medicare Other | Admitting: Radiation Oncology

## 2015-06-09 ENCOUNTER — Ambulatory Visit: Payer: Medicare Other

## 2015-06-10 ENCOUNTER — Encounter: Payer: Self-pay | Admitting: *Deleted

## 2015-06-10 DIAGNOSIS — C3492 Malignant neoplasm of unspecified part of left bronchus or lung: Secondary | ICD-10-CM

## 2015-06-11 ENCOUNTER — Telehealth: Payer: Self-pay | Admitting: *Deleted

## 2015-06-11 NOTE — Telephone Encounter (Signed)
CALLED PATIENT TO INFORM OF APPT. FOR PET AND TO DISCUSS COMING IN FOR RESULTS, LVM FOR A RETURN CALL

## 2015-06-16 ENCOUNTER — Telehealth: Payer: Self-pay | Admitting: Radiation Oncology

## 2015-06-16 NOTE — Telephone Encounter (Signed)
Returned message left by [employer]. She phoned to inquire about patient's upcoming appointments. Explained initial appointment for 06/09/2015 following his discharge from rehab was cancelled by son, [name]. Dr. Tammi Klippel requested a PET be done prior to visit. Explained to Lollie Marrow PET is scheduled for 06/24/15 and follow up with Dr. Tammi Klippel on 07/01/2015.

## 2015-06-24 ENCOUNTER — Ambulatory Visit (HOSPITAL_COMMUNITY): Payer: Medicare Other

## 2015-06-30 ENCOUNTER — Encounter (HOSPITAL_COMMUNITY)
Admission: RE | Admit: 2015-06-30 | Discharge: 2015-06-30 | Disposition: A | Discharge status: Home / Self Care | Payer: Medicare Other | Source: Ambulatory Visit | Attending: Radiation Oncology | Admitting: Radiation Oncology

## 2015-06-30 DIAGNOSIS — J984 Other disorders of lung: Secondary | ICD-10-CM | POA: Diagnosis not present

## 2015-06-30 DIAGNOSIS — C3492 Malignant neoplasm of unspecified part of left bronchus or lung: Secondary | ICD-10-CM | POA: Diagnosis present

## 2015-06-30 LAB — GLUCOSE, CAPILLARY: Glucose-Capillary: 87 mg/dL (ref 65–99)

## 2015-06-30 MED ORDER — FLUDEOXYGLUCOSE F - 18 (FDG) INJECTION
10.1000 | Freq: Once | INTRAVENOUS | Status: DC | PRN
  Administered 2015-06-30: 10.1 via INTRAVENOUS
  Filled 2015-06-30: qty 10.1

## 2015-07-01 ENCOUNTER — Encounter: Payer: Self-pay | Admitting: Radiation Oncology

## 2015-07-01 ENCOUNTER — Ambulatory Visit
Admission: RE | Admit: 2015-07-01 | Discharge: 2015-07-01 | Disposition: A | Discharge status: Home / Self Care | Payer: Medicare Other | Source: Ambulatory Visit | Attending: Radiation Oncology | Admitting: Radiation Oncology

## 2015-07-01 VITALS — BP 137/56 | HR 83 | Temp 97.7°F | Ht 69.0 in | Wt 200.1 lb

## 2015-07-01 DIAGNOSIS — C3412 Malignant neoplasm of upper lobe, left bronchus or lung: Secondary | ICD-10-CM

## 2015-07-01 NOTE — Progress Notes (Signed)
Mr. Trumbull here for reassessment.  He has had a recent fall and note skin tears on his right arm and left hand covered with tegaderm.  Travel by wheelchair today.  VSS.  He notes SOB when ambulating.   Accompanied by family member.   BP 137/56 mmHg  Pulse 83  Temp(Src) 97.7 F (36.5 C)  Ht '5\' 9"'$  (1.753 m)  Wt 200 lb 1.6 oz (90.765 kg)  BMI 29.54 kg/m2  SpO2 96%

## 2015-07-01 NOTE — Progress Notes (Signed)
Radiation Oncology         [phone] ________________________________  Name: Darrell Livingston MRN: [id number]  Date: 07/01/2015  DOB: 1921-01-02  Follow-Up Visit Note  CC: Mathews Argyle, MD  Lajean Manes, MD  Diagnosis:   81 yo man with suspected synchronous clinical stage IA non-small cell carcinomas of the left upper lung without biopsy.     ICD-9-CM ICD-10-CM   1. Cancer of upper lobe of left lung (HCC) 162.3 C34.12     Narrative:  The patient returns today for routine follow-up.  He has had a recent fall and notes skin tears on his right arm and left hand covered with tegaderm. Travel by wheelchair today. VSS. He notes SOB when ambulating. Accompanied by family member.     ALLERGIES:  has No Known Allergies.  Meds: Current Outpatient Prescriptions  Medication Sig Dispense Refill  . albuterol (PROVENTIL HFA;VENTOLIN HFA) 108 (90 BASE) MCG/ACT inhaler Inhale 1-2 puffs into the lungs every 6 (six) hours as needed for wheezing or shortness of breath.    Marland Kitchen albuterol (PROVENTIL) (2.5 MG/3ML) 0.083% nebulizer solution Take 3 mLs (2.5 mg total) by nebulization every 2 (two) hours as needed for wheezing or shortness of breath. 75 mL 12  . atorvastatin (LIPITOR) 20 MG tablet Take 10 mg by mouth daily at 6 PM.     . budesonide-formoterol (SYMBICORT) 80-4.5 MCG/ACT inhaler Inhale 2 puffs into the lungs 2 (two) times daily.    . cholecalciferol (VITAMIN D) 1000 UNITS tablet Take 1,000 Units by mouth daily. Take two tabets     . feeding supplement, ENSURE ENLIVE, (ENSURE ENLIVE) LIQD Take 237 mLs by mouth 2 (two) times daily between meals. 237 mL 12  . guaiFENesin (MUCINEX) 600 MG 12 hr tablet Take 1 tablet (600 mg total) by mouth 2 (two) times daily. 30 tablet 0  . latanoprost (XALATAN) 0.005 % ophthalmic solution Place 1 drop into both eyes at bedtime.     Marland Kitchen tiotropium (SPIRIVA) 18 MCG inhalation capsule Place 18 mcg into inhaler and inhale daily.      Marland Kitchen triamcinolone  (KENALOG) 0.025 % cream Apply 1 application topically daily at 12 noon.      No current facility-administered medications for this encounter.   Facility-Administered Medications Ordered in Other Encounters  Medication Dose Route Frequency Provider Last Rate Last Dose  . fludeoxyglucose F - 18 (FDG) injection 10.1 milli Curie  10.1 milli Curie Intravenous Once PRN Tyler Pita, MD   10.1 milli Curie at 06/30/15 0815    Physical Findings: The patient is in no acute distress. Patient is alert and oriented.  height is '5\' 9"'$  (1.753 m) and weight is 200 lb 1.6 oz (90.765 kg). His temperature is 97.7 F (36.5 C). His blood pressure is 137/56 and his pulse is 83. His oxygen saturation is 96%.  No significant changes.   Lab Findings: Lab Results  Component Value Date   WBC 8.0 04/18/2015   HGB 13.2 04/18/2015   HCT 43.1 04/18/2015   PLT 182 04/18/2015    Lab Results  Component Value Date   NA 140 04/18/2015   K 4.3 04/18/2015   CO2 42* 04/18/2015   GLUCOSE 104* 04/18/2015   BUN 24* 04/18/2015   CREATININE 0.72 04/18/2015   BILITOT 0.6 04/18/2015   ALKPHOS 65 04/18/2015   AST 24 04/18/2015   ALT 34 04/18/2015   PROT 5.8* 04/18/2015   ALBUMIN 2.2* 04/18/2015   CALCIUM 8.7* 04/18/2015   ANIONGAP 5 04/18/2015  Radiographic Findings: Nm Pet Image Restag (ps) Skull Base To Thigh  06/30/2015  CLINICAL DATA:  Subsequent treatment strategy for left lung non-small cell carcinoma. EXAM: NUCLEAR MEDICINE PET SKULL BASE TO THIGH TECHNIQUE: 10.1 mCi F-18 FDG was injected intravenously. Full-ring PET imaging was performed from the skull base to thigh after the radiotracer. CT data was obtained and used for attenuation correction and anatomic localization. FASTING BLOOD GLUCOSE:  Value: 87 mg/dl COMPARISON:  Multiple exams, including 04/09/2015 FINDINGS: NECK No hypermetabolic lymph nodes in the neck. CHEST Left upper lobe peripheral pulmonary subpleural nodule, 1.4 cm diameter on image 22  series 8, maximum standard uptake value 14.6. Partially cavitary 1.4 by 1.0 cm left upper lobe nodule on image 29 series 8, maximum standard uptake value 11.1. Considerable scarring in the right upper lobe and right middle lobe, with only low-grade activity (approximately 2.8 in the upper lobe and up to 2.6 in the lower lobe, without the level of focal activity that I would expect for neoplasm. The sipping chronic back through 2012. Coronary, aortic arch, and branch vessel atherosclerotic vascular disease. Trace bilateral pleural effusions without hypermetabolic activity. Small mediastinal lymph nodes are not hypermetabolic. ABDOMEN/PELVIS There is high physiologic activity diffusely through the bowel which reduces sensitivity in finding focal abnormalities in the bowel. There is high activity along the left proximal hamstring tendon and adjacent musculature with suspicion for a hamstring tear based on indistinct appearance; the high hamstring activity has a maximum standard uptake value of about 5.8. There is some high muscular activity along the right lateral gluteus maximus common item maximum standard uptake value 5.2, likely physiologic or related to injury. Bilateral renal cysts are photopenic. Aortoiliac atherosclerotic vascular disease. Suspected chronic dissection along the distal abdominal aorta based on calcification pattern along the lumen. SKELETON Subtle irregularity of the head left anterior eighth rib cortex on image 60 of series 8, with high metabolic activity favoring healing fracture, maximum SUV 4.5. There is some degenerative high activity along the thoracic and lumbar spine. IMPRESSION: 1. The 2 left upper lobe pulmonary nodules are hypermetabolic compatible with malignancy. No sign of spread elsewhere. 2. The chronic benign appearing scarring in the right lung. 3. Nondisplaced fracture of the left anterior eighth rib with associated metabolic activity. 4. Suspected fairly recent tear of the  left hamstring tendon with associated hypermetabolic activity. There is edema tracking along the proximal hamstring musculature. 5. Other imaging findings of potential clinical significance: Coronary, aortic arch, and branch vessel atherosclerotic vascular disease. Trace bilateral pleural effusions. Aortoiliac atherosclerotic vascular disease. 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 Electronically Signed   By: Van Clines M.D.   On: 06/30/2015 10:17    Impression:  We discussed the results of the patient's 06/30/15 PET scan at our multidisciplinary tumor board this morning. The patient would not be a good candidate for surgery due to his age and current health. The patient would be a good candidate for SBRT.   Plan:  Today, I talked to the patient and family about the findings and work-up thus far.  We discussed the natural history of unresectable early stage non-small cell lung cancer and general treatment, highlighting the role of stereotactic body radiotherapy in the management.  We discussed the available radiation techniques, and focused on the details of logistics and delivery.  We reviewed the anticipated acute and late sequelae associated with radiation in this setting.  The patient was encouraged to ask questions that I answered to the best of my  ability.  I filled out a patient counseling form during our discussion including treatment diagrams.  We retained a copy for our records.  The patient would like to proceed with SBRT and he has been scheduled for CT simulation Friday, December 2nd, at 1 pm.     _____________________________________  Sheral Apley. Tammi Klippel, M.D.  This document serves as a record of services personally performed by Tyler Pita, MD. It was created on his behalf by Arlyce Harman, a trained medical scribe. The creation of this record is based on the scribe's personal observations and the provider's statements to them. This document has been  checked and approved by the attending provider.

## 2015-07-16 ENCOUNTER — Ambulatory Visit
Admission: RE | Admit: 2015-07-16 | Discharge: 2015-07-16 | Disposition: A | Discharge status: Home / Self Care | Payer: Medicare Other | Source: Ambulatory Visit | Attending: Radiation Oncology | Admitting: Radiation Oncology

## 2015-07-16 DIAGNOSIS — C3492 Malignant neoplasm of unspecified part of left bronchus or lung: Secondary | ICD-10-CM | POA: Diagnosis present

## 2015-07-16 DIAGNOSIS — Z510 Encounter for antineoplastic radiation therapy: Secondary | ICD-10-CM | POA: Diagnosis not present

## 2015-07-16 DIAGNOSIS — C3412 Malignant neoplasm of upper lobe, left bronchus or lung: Secondary | ICD-10-CM

## 2015-07-16 NOTE — Progress Notes (Signed)
  Radiation Oncology         [phone]) [id number] ________________________________  Name: Darrell Livingston MRN: [id number]  Date: 07/16/2015  DOB: Jul 11, 1921  STEREOTACTIC BODY RADIOTHERAPY SIMULATION AND TREATMENT PLANNING NOTE    ICD-9-CM ICD-10-CM   1. Cancer of upper lobe of left lung (HCC) 162.3 C34.12   2. Non-small cell carcinoma of left lung (HCC) 162.9 C34.92     DIAGNOSIS:  81 yo man with suspected synchronous clinical stage IA non-small cell carcinomas of the left upper lung without biopsy  NARRATIVE:  The patient was brought to the [facility].  Identity was confirmed.  All relevant records and images related to the planned course of therapy were reviewed.  The patient freely provided informed written consent to proceed with treatment after reviewing the details related to the planned course of therapy. The consent form was witnessed and verified by the simulation staff.  Then, the patient was set-up in a stable reproducible  supine position for radiation therapy.  A BodyFix immobilization pillow was fabricated for reproducible positioning.  Then I personally applied the abdominal compression paddle to limit respiratory excursion.  4D respiratoy motion management CT images were obtained.  Surface markings were placed.  The CT images were loaded into the planning software.  Then, using Cine, MIP, and standard views, the internal target volume (ITV) and planning target volumes (PTV) were delinieated, and avoidance structures were contoured.  Treatment planning then occurred.  The radiation prescription was entered and confirmed.  A total of two complex treatment devices were fabricated in the form of the BodyFix immobilization pillow and a neck accuform cushion.  I have requested : 3D Simulation  I have requested a DVH of the following structures: Heart, Lungs, Esophagus, Chest Wall, Brachial Plexus, Major Blood Vessels, and targets.  PLAN:  The patient will receive 54 Gy in 3  fraction.  ________________________________  Sheral Apley Tammi Klippel, M.D.  This document serves as a record of services personally performed by Tyler Pita, MD. It was created on his behalf by Arlyce Harman, a trained medical scribe. The creation of this record is based on the scribe's personal observations and the provider's statements to them. This document has been checked and approved by the attending provider.

## 2015-07-23 ENCOUNTER — Telehealth: Payer: Self-pay | Admitting: *Deleted

## 2015-07-23 NOTE — Telephone Encounter (Signed)
A cd came in on 07-23-15 from [facility] gave to Dr. Tammi Klippel

## 2015-07-26 ENCOUNTER — Other Ambulatory Visit: Payer: Self-pay | Admitting: Geriatric Medicine

## 2015-07-26 DIAGNOSIS — Z510 Encounter for antineoplastic radiation therapy: Secondary | ICD-10-CM | POA: Diagnosis not present

## 2015-07-26 DIAGNOSIS — I739 Peripheral vascular disease, unspecified: Secondary | ICD-10-CM

## 2015-07-29 ENCOUNTER — Ambulatory Visit
Admission: RE | Admit: 2015-07-29 | Discharge: 2015-07-29 | Disposition: A | Discharge status: Home / Self Care | Payer: Medicare Other | Source: Ambulatory Visit | Attending: Radiation Oncology | Admitting: Radiation Oncology

## 2015-07-29 DIAGNOSIS — Z510 Encounter for antineoplastic radiation therapy: Secondary | ICD-10-CM | POA: Diagnosis not present

## 2015-07-30 ENCOUNTER — Ambulatory Visit
Admission: RE | Admit: 2015-07-30 | Discharge: 2015-07-30 | Disposition: A | Discharge status: Home / Self Care | Payer: Medicare Other | Source: Ambulatory Visit | Attending: Geriatric Medicine | Admitting: Geriatric Medicine

## 2015-07-30 DIAGNOSIS — I739 Peripheral vascular disease, unspecified: Secondary | ICD-10-CM

## 2015-08-02 ENCOUNTER — Ambulatory Visit
Admission: RE | Admit: 2015-08-02 | Discharge: 2015-08-02 | Disposition: A | Discharge status: Home / Self Care | Payer: Medicare Other | Source: Ambulatory Visit | Attending: Radiation Oncology | Admitting: Radiation Oncology

## 2015-08-02 DIAGNOSIS — Z510 Encounter for antineoplastic radiation therapy: Secondary | ICD-10-CM | POA: Diagnosis not present

## 2015-08-04 ENCOUNTER — Ambulatory Visit
Admission: RE | Admit: 2015-08-04 | Discharge: 2015-08-04 | Disposition: A | Discharge status: Home / Self Care | Payer: Medicare Other | Source: Ambulatory Visit | Attending: Radiation Oncology | Admitting: Radiation Oncology

## 2015-08-04 ENCOUNTER — Encounter: Payer: Self-pay | Admitting: Radiation Oncology

## 2015-08-04 DIAGNOSIS — C3412 Malignant neoplasm of upper lobe, left bronchus or lung: Secondary | ICD-10-CM

## 2015-08-04 DIAGNOSIS — Z510 Encounter for antineoplastic radiation therapy: Secondary | ICD-10-CM | POA: Diagnosis not present

## 2015-08-04 NOTE — Progress Notes (Signed)
Vitals stable. Denies pain. Denies difficulty or pain associated with swallowing. Denies a cough. Reports SOB continues but, is no worse. Reports fatigue is no worse. Denies skin changes within treatment field. Given one month follow up appointment card.   BP 114/56 mmHg  Pulse 83  Resp 16  Wt   SpO2 100% Wt Readings from Last 3 Encounters:  07/01/15 200 lb 1.6 oz (90.765 kg)  04/19/15 208 lb 8.9 oz (94.6 kg)  04/08/15 211 lb 1.6 oz (95.754 kg)

## 2015-08-04 NOTE — Progress Notes (Signed)
   Department of Radiation Oncology  Phone:  [phone] Fax:        [phone]  Weekly Treatment Note    Name: Darrell Livingston Date: 08/04/2015 MRN: [id number] DOB: 19-Nov-1920   Diagnosis:     ICD-9-CM ICD-10-CM   1. Cancer of upper lobe of left lung (HCC) 162.3 C34.12      Current dose: 54 Gy  Current fraction:3   MEDICATIONS: Current Outpatient Prescriptions  Medication Sig Dispense Refill  . albuterol (PROVENTIL HFA;VENTOLIN HFA) 108 (90 BASE) MCG/ACT inhaler Inhale 1-2 puffs into the lungs every 6 (six) hours as needed for wheezing or shortness of breath.    Marland Kitchen albuterol (PROVENTIL) (2.5 MG/3ML) 0.083% nebulizer solution Take 3 mLs (2.5 mg total) by nebulization every 2 (two) hours as needed for wheezing or shortness of breath. 75 mL 12  . atorvastatin (LIPITOR) 20 MG tablet Take 10 mg by mouth daily at 6 PM.     . budesonide-formoterol (SYMBICORT) 80-4.5 MCG/ACT inhaler Inhale 2 puffs into the lungs 2 (two) times daily.    . cholecalciferol (VITAMIN D) 1000 UNITS tablet Take 1,000 Units by mouth daily. Take two tabets     . feeding supplement, ENSURE ENLIVE, (ENSURE ENLIVE) LIQD Take 237 mLs by mouth 2 (two) times daily between meals. 237 mL 12  . guaiFENesin (MUCINEX) 600 MG 12 hr tablet Take 1 tablet (600 mg total) by mouth 2 (two) times daily. 30 tablet 0  . latanoprost (XALATAN) 0.005 % ophthalmic solution Place 1 drop into both eyes at bedtime.     Marland Kitchen tiotropium (SPIRIVA) 18 MCG inhalation capsule Place 18 mcg into inhaler and inhale daily.      Marland Kitchen triamcinolone (KENALOG) 0.025 % cream Apply 1 application topically daily at 12 noon.      No current facility-administered medications for this encounter.     ALLERGIES: Review of patient's allergies indicates no known allergies.   LABORATORY DATA:  Lab Results  Component Value Date   WBC 8.0 04/18/2015   HGB 13.2 04/18/2015   HCT 43.1 04/18/2015   MCV 96.4 04/18/2015   PLT 182 04/18/2015   Lab Results    Component Value Date   NA 140 04/18/2015   K 4.3 04/18/2015   CL 93* 04/18/2015   CO2 42* 04/18/2015   Lab Results  Component Value Date   ALT 34 04/18/2015   AST 24 04/18/2015   ALKPHOS 65 04/18/2015   BILITOT 0.6 04/18/2015     NARRATIVE: Darrell Livingston was seen today for weekly treatment management. The chart was checked and the patient's films were reviewed.  Vitals stable. Denies pain. Denies difficulty or pain associated with swallowing. Denies a cough. Reports SOB continues but, is no worse. Reports fatigue is no worse. Denies skin changes within treatment field. Given one month follow up appointment card.   There were no vitals taken for this visit. Wt Readings from Last 3 Encounters:  07/01/15 200 lb 1.6 oz (90.765 kg)  04/19/15 208 lb 8.9 oz (94.6 kg)  04/08/15 211 lb 1.6 oz (95.754 kg)     PHYSICAL EXAMINATION: vitals were not taken for this visit.       ASSESSMENT: The patient is doing satisfactorily with treatment.  PLAN: We will continue with the patient's radiation treatment as planned.

## 2015-08-25 ENCOUNTER — Ambulatory Visit: Payer: Medicare Other | Admitting: Podiatry

## 2015-09-16 ENCOUNTER — Encounter: Payer: Self-pay | Admitting: Radiation Oncology

## 2015-09-16 ENCOUNTER — Ambulatory Visit
Admission: RE | Admit: 2015-09-16 | Discharge: 2015-09-16 | Disposition: A | Discharge status: Home / Self Care | Payer: Medicare Other | Source: Ambulatory Visit | Attending: Radiation Oncology | Admitting: Radiation Oncology

## 2015-09-16 VITALS — BP 157/61 | HR 56 | Resp 16 | Ht 69.0 in | Wt 189.0 lb

## 2015-09-16 DIAGNOSIS — C3412 Malignant neoplasm of upper lobe, left bronchus or lung: Secondary | ICD-10-CM

## 2015-09-16 NOTE — Patient Instructions (Signed)
Contact our office if you have any questions following today's appointment: [phone].  

## 2015-09-16 NOTE — Progress Notes (Signed)
Weight and vitals stable. Denies pain. Denies cough. Reports SOB only with exertion. Denies pain or difficulty associated with swallowing. Reports his energy level has greatly improved and he is driving again. Patient reports he sent his oxygen back to the home care agency because he "didn't need it anymore." Dr. Sabra Heck, [facility] oncologist, has retired. Patient now seeing Dr. Reva Bores, MD at the [facility]. Dr. Merrie Roof is requesting all records and considering doing a CT of the chest in six months per the patient. Fax no. [phone], pager is [phone], and email is [email or web address]. Patient reports he is scheduled to follow up with Dr. Merrie Roof again March 24th.   BP 157/61 mmHg  Pulse 56  Resp 16  Ht '5\' 9"'$  (1.753 m)  Wt 189 lb (85.73 kg)  BMI 27.90 kg/m2  SpO2 95% Wt Readings from Last 3 Encounters:  09/16/15 189 lb (85.73 kg)  07/01/15 200 lb 1.6 oz (90.765 kg)  04/19/15 208 lb 8.9 oz (94.6 kg)

## 2015-09-16 NOTE — Progress Notes (Signed)
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 Darrell Livingston returns today for routine follow-up.  He is a pleasant 82 y.o man with suspected synchronous clinical stage IA non-small cell carcinomas of the left upper lung without biopsy.   On review of systems, he reports his energy level has greatly improved and he is driving again. Patient reports that he sent his oxygen back to the home care agency because he "didn't need it anymore." He denies any shortness of breath with rest but has noticed this with exertion.  He established and Dr. Merrie Roof at [facility]. He has plans for follow up on November 05, 2015. He denies any fevers, chills, shortness of breath chest pain. He denies any bowel or bladder dysfunction. A complete review of systems is obtained and is otherwise negative.                            ALLERGIES:  has No Known Allergies.  Meds: Current Outpatient Prescriptions  Medication Sig Dispense Refill  . albuterol (PROVENTIL HFA;VENTOLIN HFA) 108 (90 BASE) MCG/ACT inhaler Inhale 1-2 puffs into the lungs every 6 (six) hours as needed for wheezing or shortness of breath.    Marland Kitchen albuterol (PROVENTIL) (2.5 MG/3ML) 0.083% nebulizer solution Take 3 mLs (2.5 mg total) by nebulization every 2 (two) hours as needed for wheezing or shortness of breath. 75 mL 12  . atorvastatin (LIPITOR) 20 MG tablet Take 10 mg by mouth daily at 6 PM.     . budesonide-formoterol (SYMBICORT) 80-4.5 MCG/ACT inhaler Inhale 2 puffs into the lungs 2 (two) times daily.    . cholecalciferol (VITAMIN D)  1000 UNITS tablet Take 1,000 Units by mouth daily. Take two tabets     . feeding supplement, ENSURE ENLIVE, (ENSURE ENLIVE) LIQD Take 237 mLs by mouth 2 (two) times daily between meals. 237 mL 12  . guaiFENesin (MUCINEX) 600 MG 12 hr tablet Take 1 tablet (600 mg total) by mouth 2 (two) times daily. 30 tablet 0  . latanoprost (XALATAN) 0.005 % ophthalmic solution Place 1 drop into both eyes at bedtime.     Marland Kitchen tiotropium (SPIRIVA) 18 MCG inhalation capsule Place 18 mcg into inhaler and inhale daily.      Marland Kitchen triamcinolone (KENALOG) 0.025 % cream Apply 1 application topically daily at 12 noon.      No current facility-administered medications for this encounter.    Physical Findings:  height is '5\' 9"'$  (1.753 m) and weight is 189 lb (85.73 kg). His blood pressure is 157/61 and his pulse is 56. His respiration is 16 and oxygen saturation is 95%.   Pain scale 0/10 In general this is a well-appearing Caucasian male in no acute distress. He is alert and oriented times for appropriate throughout the examination. Cardiovascular exam reveals a regular rate and rhythm no clicks rubs or murmurs auscultated chest is clear to auscultation bilaterally. No palpable adenopathy is noted of the supraclavicular or deep cervical chains.  Lab Findings: Lab Results  Component Value Date   WBC 8.0  04/18/2015   HGB 13.2 04/18/2015   HCT 43.1 04/18/2015   PLT 182 04/18/2015    Lab Results  Component Value Date   NA 140 04/18/2015   K 4.3 04/18/2015   CO2 42* 04/18/2015   GLUCOSE 104* 04/18/2015   BUN 24* 04/18/2015   CREATININE 0.72 04/18/2015   BILITOT 0.6 04/18/2015   ALKPHOS 65 04/18/2015   AST 24 04/18/2015   ALT 34 04/18/2015   PROT 5.8* 04/18/2015   ALBUMIN 2.2* 04/18/2015   CALCIUM 8.7* 04/18/2015   ANIONGAP 5 04/18/2015    Radiographic Findings: No results found.  Impression:  Suspected synchronous clinical stage IA non-small cell carcinomas of the left upper lung s/p SBRT   Plan:  The  patient is clinically without disease and we will plan to see him back in April for a follow-up evaluation. He will be getting routine scans, will likely follow him as needed after that visit. I will contact Dr. Merrie Roof to determine if she would like me to order the patient's CT scan.   The above documentation reflects my direct findings during this shared patient visit. Please see the separate note by Dr. Tammi Klippel on this date for the remainder of the patient's plan of care.  Carola Rhine, PAC   This document serves as a record of services personally performed by Shona Simpson, PA and  Tyler Pita, MD. It was created on their behalf by Jenell Milliner, a trained medical scribe. The creation of this record is based on the scribe's personal observations and the provider's statements to them. This document has been checked and approved by the attending provider.

## 2015-10-04 NOTE — Progress Notes (Signed)
  Radiation Oncology [phone]  ________________________________  Name: Darrell Livingston MRN: [id number]  Date: 08/04/2015 DOB: 05/01/1921  End of Treatment Note     ICD-9-CM  ICD-10-CM    1.  Cancer of upper lobe of left lung (HCC)  162.3  C34.12    2.  Non-small cell carcinoma of left lung (HCC)  162.9  C34.92    DIAGNOSIS: 82 yo man with suspected synchronous clinical stage IA non-small cell carcinomas of the left upper lung without biopsy    Indication for treatment: Curative, Definitive SBRT   Radiation treatment dates: 07/29/2015, 08/02/2015, 08/04/2015   Site/dose: The target was treated to 54 Gy in 3 fractions of 18 Gy   Beams/energy: The patient was treated using stereotactic body radiotherapy according to a 3D conformal radiotherapy plan. Volumetric arc fields were employed to deliver 6 MV X-rays. Image guidance was performed with per fraction cone beam CT prior to treatment under personal MD supervision. Immobilization was achieved using BodyFix Pillow.   Narrative: The patient tolerated radiation treatment relatively well. He did not have any acute complications associated with treatment.   Plan: The patient has completed radiation treatment. The patient will return to radiation oncology clinic for routine followup in one month. I advised them to call or return sooner if they have any questions or concerns related to their recovery or treatment.  ________________________________   Sheral Apley. Tammi Klippel, M.D.    This document serves as a record of services personally performed by Tyler Pita, MD. It was created on his behalf by Lendon Collar, a trained medical scribe. The creation of this record is based on the scribe's personal observations and the provider's statements to them. This document has been checked and approved by the attending provider.

## 2015-10-27 ENCOUNTER — Encounter (HOSPITAL_COMMUNITY): Payer: Self-pay | Admitting: *Deleted

## 2015-10-27 ENCOUNTER — Emergency Department (HOSPITAL_COMMUNITY): Payer: Medicare Other

## 2015-10-27 ENCOUNTER — Emergency Department (HOSPITAL_COMMUNITY)
Admission: EM | Admit: 2015-10-27 | Discharge: 2015-10-27 | Disposition: A | Discharge status: Home / Self Care | Payer: Medicare Other | Attending: Emergency Medicine | Admitting: Emergency Medicine

## 2015-10-27 DIAGNOSIS — Z87891 Personal history of nicotine dependence: Secondary | ICD-10-CM | POA: Insufficient documentation

## 2015-10-27 DIAGNOSIS — I10 Essential (primary) hypertension: Secondary | ICD-10-CM | POA: Diagnosis not present

## 2015-10-27 DIAGNOSIS — Z7952 Long term (current) use of systemic steroids: Secondary | ICD-10-CM | POA: Insufficient documentation

## 2015-10-27 DIAGNOSIS — Z7951 Long term (current) use of inhaled steroids: Secondary | ICD-10-CM | POA: Insufficient documentation

## 2015-10-27 DIAGNOSIS — W19XXXA Unspecified fall, initial encounter: Secondary | ICD-10-CM

## 2015-10-27 DIAGNOSIS — E119 Type 2 diabetes mellitus without complications: Secondary | ICD-10-CM | POA: Insufficient documentation

## 2015-10-27 DIAGNOSIS — Y9389 Activity, other specified: Secondary | ICD-10-CM | POA: Diagnosis not present

## 2015-10-27 DIAGNOSIS — R52 Pain, unspecified: Secondary | ICD-10-CM

## 2015-10-27 DIAGNOSIS — Z79899 Other long term (current) drug therapy: Secondary | ICD-10-CM | POA: Diagnosis not present

## 2015-10-27 DIAGNOSIS — W010XXA Fall on same level from slipping, tripping and stumbling without subsequent striking against object, initial encounter: Secondary | ICD-10-CM | POA: Diagnosis not present

## 2015-10-27 DIAGNOSIS — S66107A Unspecified injury of flexor muscle, fascia and tendon of left little finger at wrist and hand level, initial encounter: Secondary | ICD-10-CM | POA: Diagnosis not present

## 2015-10-27 DIAGNOSIS — I2510 Atherosclerotic heart disease of native coronary artery without angina pectoris: Secondary | ICD-10-CM | POA: Insufficient documentation

## 2015-10-27 DIAGNOSIS — Y92009 Unspecified place in unspecified non-institutional (private) residence as the place of occurrence of the external cause: Secondary | ICD-10-CM | POA: Diagnosis not present

## 2015-10-27 DIAGNOSIS — J449 Chronic obstructive pulmonary disease, unspecified: Secondary | ICD-10-CM | POA: Diagnosis not present

## 2015-10-27 DIAGNOSIS — Y998 Other external cause status: Secondary | ICD-10-CM | POA: Insufficient documentation

## 2015-10-27 DIAGNOSIS — S60417A Abrasion of left little finger, initial encounter: Secondary | ICD-10-CM | POA: Diagnosis not present

## 2015-10-27 DIAGNOSIS — Z85118 Personal history of other malignant neoplasm of bronchus and lung: Secondary | ICD-10-CM | POA: Diagnosis not present

## 2015-10-27 DIAGNOSIS — Z9861 Coronary angioplasty status: Secondary | ICD-10-CM | POA: Diagnosis not present

## 2015-10-27 DIAGNOSIS — Z23 Encounter for immunization: Secondary | ICD-10-CM | POA: Insufficient documentation

## 2015-10-27 DIAGNOSIS — S6992XA Unspecified injury of left wrist, hand and finger(s), initial encounter: Secondary | ICD-10-CM | POA: Diagnosis present

## 2015-10-27 DIAGNOSIS — IMO0002 Reserved for concepts with insufficient information to code with codable children: Secondary | ICD-10-CM

## 2015-10-27 MED ORDER — OXYCODONE-ACETAMINOPHEN 5-325 MG PO TABS
1.0000 | ORAL_TABLET | Freq: Once | ORAL | Status: AC
  Administered 2015-10-27: 1 via ORAL
  Filled 2015-10-27: qty 1

## 2015-10-27 MED ORDER — OXYCODONE-ACETAMINOPHEN 5-325 MG PO TABS: 1.0000 | ORAL_TABLET | Freq: Three times a day (TID) | ORAL | Status: DC | PRN

## 2015-10-27 MED ORDER — TETANUS-DIPHTH-ACELL PERTUSSIS 5-2.5-18.5 LF-MCG/0.5 IM SUSP
0.5000 mL | Freq: Once | INTRAMUSCULAR | Status: AC
  Administered 2015-10-27: 0.5 mL via INTRAMUSCULAR
  Filled 2015-10-27: qty 0.5

## 2015-10-27 NOTE — Progress Notes (Signed)
Orthopedic Tech Progress Note Patient Details:  Darrell Livingston 1921/07/08 [id number]  Ortho Devices Type of Ortho Device: Arm sling, Volar splint Ortho Device/Splint Location: lue volar splint Ortho Device/Splint Interventions: Ordered, Application   Karolee Stamps 10/27/2015, 8:22 PM

## 2015-10-27 NOTE — ED Provider Notes (Signed)
CSN: [id number]     Arrival date & time 10/27/15  1524 History   First MD Initiated Contact with Patient 10/27/15 1845     Chief Complaint  Patient presents with  . Fall  . Hand Pain   Patient gave verbal permission to utilize photo for medical documentation only The image was not stored on any personal device   Patient is a 82 y.o. male presenting with fall. The history is provided by the patient and a relative.  Fall This is a new problem. The current episode started yesterday. The problem has not changed since onset.Pertinent negatives include no chest pain, no abdominal pain and no headaches. Nothing aggravates the symptoms. Nothing relieves the symptoms.  Patient with h/o COPD/CAD who presents from home with son s/p fall Pt fells yesterday at home - reports he slipped with his slippers No head injury No LOC No new HA No new back or neck pain No cp No abd pain He reports injury to left hand and fingers He lives with son, but pt did not report injury until this morning.  They went to PCP who sent him to ER Pt has had increased falls recent due to balance issues and neuropathy in feet   Past Medical History  Diagnosis Date  . COPD (chronic obstructive pulmonary disease) ([place])   . Lung cancer ([place])   . Cardiac disorder   . Coronary artery disease   . Hypertension   . Diabetes mellitus   . COPD (chronic obstructive pulmonary disease) ([place]) 07/20/2011  . DM type 2 (diabetes mellitus, type 2) ([place]) 07/20/2011  . Hypertension 07/20/2011   Past Surgical History  Procedure Laterality Date  . Cardiac surgery    . Cholecystectomy    . Coronary angioplasty with stent placement    . Eye surgery     Family History  Problem Relation Age of Onset  . Cancer Neg Hx    Social History  Substance Use Topics  . Smoking status: Former Smoker -- 2.00 packs/day for 54 years    Types: Cigarettes    Quit date: 11/17/1996  . Smokeless tobacco: Never Used  . Alcohol Use: Yes   Comment: up to 2 drinks per day but not everyday per pt    Review of Systems  Constitutional: Negative for fever.  Cardiovascular: Negative for chest pain.  Gastrointestinal: Negative for abdominal pain.  Musculoskeletal: Positive for arthralgias.  Skin: Positive for wound.  Neurological: Negative for headaches.  All other systems reviewed and are negative.     Allergies  Review of patient's allergies indicates no known allergies.  Home Medications   Prior to Admission medications   Medication Sig Start Date End Date Taking? Authorizing Provider  albuterol (PROVENTIL HFA;VENTOLIN HFA) 108 (90 BASE) MCG/ACT inhaler Inhale 1-2 puffs into the lungs every 6 (six) hours as needed for wheezing or shortness of breath.    Historical Provider, MD  albuterol (PROVENTIL) (2.5 MG/3ML) 0.083% nebulizer solution Take 3 mLs (2.5 mg total) by nebulization every 2 (two) hours as needed for wheezing or shortness of breath. 04/20/15   Allie Bossier, MD  atorvastatin (LIPITOR) 20 MG tablet Take 10 mg by mouth daily at 6 PM.     Historical Provider, MD  budesonide-formoterol (SYMBICORT) 80-4.5 MCG/ACT inhaler Inhale 2 puffs into the lungs 2 (two) times daily.    Historical Provider, MD  cholecalciferol (VITAMIN D) 1000 UNITS tablet Take 1,000 Units by mouth daily. Take two tabets     Historical Provider,  MD  feeding supplement, ENSURE ENLIVE, (ENSURE ENLIVE) LIQD Take 237 mLs by mouth 2 (two) times daily between meals. 04/20/15   Allie Bossier, MD  guaiFENesin (MUCINEX) 600 MG 12 hr tablet Take 1 tablet (600 mg total) by mouth 2 (two) times daily. 04/20/15   Allie Bossier, MD  latanoprost (XALATAN) 0.005 % ophthalmic solution Place 1 drop into both eyes at bedtime.     Historical Provider, MD  tiotropium (SPIRIVA) 18 MCG inhalation capsule Place 18 mcg into inhaler and inhale daily.      Historical Provider, MD  triamcinolone (KENALOG) 0.025 % cream Apply 1 application topically daily at 12 noon.      Historical Provider, MD   BP 149/87 mmHg  Pulse 96  Temp(Src) 98.1 F (36.7 C) (Oral)  Resp 18  SpO2 97% Physical Exam CONSTITUTIONAL: frail, elderly HEAD: Normocephalic/atraumatic EYES: EOMI ENMT: Mucous membranes moist NECK: supple no meningeal signs SPINE/BACK:entire spine nontender CV: S1/S2 noted, no murmurs/rubs/gallops noted LUNGS: Lungs are clear to auscultation bilaterally, no apparent distress ABDOMEN: soft, nontender NEURO: Pt is awake/alert/appropriate, moves all extremitiesx4.   EXTREMITIES: pulses normal/equal, full ROM.  See photo for left hand He has diffuse bruising to all extremities, healing wound to left prox tibial surface but no deformity and full ROM of all other extremities SKIN: warm, color normal        ED Course  Procedures  SPLINT APPLICATION Date/Time: 8:58 PM Authorized by: Sharyon Cable Consent: Verbal consent obtained. Risks and benefits: risks, benefits and alternatives were discussed Consent given by: patient Splint applied by: orthopedic technician Location details: left hand Splint type: ulnar gutter Supplies used: fiberglass Post-procedure: The splinted body part was neurovascularly unchanged following the procedure. Patient tolerance: Patient tolerated the procedure well with no immediate complications.    Imaging Review Dg Hand Complete Left  10/27/2015  CLINICAL DATA:  Status post fall 1 day ago. Deformities of the ring and little fingers. Initial encounter. EXAM: LEFT HAND - COMPLETE 3+ VIEW COMPARISON:  None. FINDINGS: The PIP joints of the ring and little fingers are in extreme flexion. No fracture is identified. Image bones and joints otherwise appear normal. Soft tissues are unremarkable. IMPRESSION: Extreme flexion of the PIP joints of the ring and little fingers of unknown chronicity. Negative for fracture. Electronically Signed   By: Inge Rise M.D.   On: 10/27/2015 15:57   I have personally reviewed and  evaluated these images  results as part of my medical decision-making.  I spoke to dr Caralyn Guile at approximately 8502 We discussed exam findings and his xray No fx noted Discussed deep abrasion to little finger He requests cleansing wound Place dressing to wound Splint hand but leaves fingers flexed He does not recommend antibiotics He will see in office tomorrow This was d/w patient/son Pt with ?tendon injury but wound is >24 hours old   I have also consulted case management for home health This has been ordered Son and patient both would like to go home He has wheelchair at home He reports fall was mechanical Denies LOC or new weakness MDM   Final diagnoses:  Fall, initial encounter  Abrasion of left little finger, initial encounter  Tendon injury    Nursing notes including past medical history and social history reviewed and considered in documentation xrays/imaging reviewed by myself and considered during evaluation     Ripley Fraise, MD 10/27/15 2045

## 2015-10-27 NOTE — ED Notes (Addendum)
Pt reports falling yesterday, obv deformity noted to left little and ring finger. Dressing applied and bleeding controlled.

## 2015-11-18 ENCOUNTER — Ambulatory Visit: Payer: Medicare Other | Admitting: Radiation Oncology

## 2015-11-18 ENCOUNTER — Telehealth: Payer: Self-pay | Admitting: *Deleted

## 2015-11-18 ENCOUNTER — Telehealth: Payer: Self-pay | Admitting: Radiation Oncology

## 2015-11-18 DIAGNOSIS — C3492 Malignant neoplasm of unspecified part of left bronchus or lung: Secondary | ICD-10-CM

## 2015-11-18 NOTE — Telephone Encounter (Signed)
I spoke with the patient to check in on his status. He is unable to come to clinic due to transportation issues. He reports that he is doing well since his radiation therapy finished, and he denies any shortness of breath or chest pain. He does have a chronic cough and is coughing of slightly yellow mucus. He denies any fevers or chills. We discussed the rationale for repeat repeat imaging of the chest with a CT scan approximately time. He states agreement and we have offered to follow by phone his progress. He is counseled keep [place] informed of any symptoms that progressively worsening prior to that time, and we would be happy to see him in the clinic if he is able to get to [place].

## 2015-11-18 NOTE — Telephone Encounter (Signed)
CALLED PATIENT TO INFORM OF CT AND FU, SPOKE WITH PATIENT AND HE IS AWARE OF THESE APPTS.

## 2015-11-22 ENCOUNTER — Observation Stay (HOSPITAL_COMMUNITY)
Admission: EM | Admit: 2015-11-22 | Discharge: 2015-12-13 | Disposition: E | Payer: Medicare Other | Attending: Family Medicine | Admitting: Family Medicine

## 2015-11-22 ENCOUNTER — Emergency Department (HOSPITAL_COMMUNITY): Payer: Medicare Other

## 2015-11-22 ENCOUNTER — Encounter (HOSPITAL_COMMUNITY): Payer: Self-pay | Admitting: Emergency Medicine

## 2015-11-22 DIAGNOSIS — Z955 Presence of coronary angioplasty implant and graft: Secondary | ICD-10-CM | POA: Insufficient documentation

## 2015-11-22 DIAGNOSIS — Z6825 Body mass index (BMI) 25.0-25.9, adult: Secondary | ICD-10-CM | POA: Diagnosis not present

## 2015-11-22 DIAGNOSIS — J449 Chronic obstructive pulmonary disease, unspecified: Secondary | ICD-10-CM | POA: Insufficient documentation

## 2015-11-22 DIAGNOSIS — Z66 Do not resuscitate: Secondary | ICD-10-CM | POA: Insufficient documentation

## 2015-11-22 DIAGNOSIS — C3492 Malignant neoplasm of unspecified part of left bronchus or lung: Secondary | ICD-10-CM | POA: Insufficient documentation

## 2015-11-22 DIAGNOSIS — E119 Type 2 diabetes mellitus without complications: Secondary | ICD-10-CM | POA: Insufficient documentation

## 2015-11-22 DIAGNOSIS — C3412 Malignant neoplasm of upper lobe, left bronchus or lung: Secondary | ICD-10-CM

## 2015-11-22 DIAGNOSIS — J9621 Acute and chronic respiratory failure with hypoxia: Secondary | ICD-10-CM | POA: Insufficient documentation

## 2015-11-22 DIAGNOSIS — N289 Disorder of kidney and ureter, unspecified: Secondary | ICD-10-CM | POA: Diagnosis not present

## 2015-11-22 DIAGNOSIS — E8770 Fluid overload, unspecified: Secondary | ICD-10-CM | POA: Diagnosis present

## 2015-11-22 DIAGNOSIS — Z7982 Long term (current) use of aspirin: Secondary | ICD-10-CM | POA: Insufficient documentation

## 2015-11-22 DIAGNOSIS — I10 Essential (primary) hypertension: Secondary | ICD-10-CM | POA: Diagnosis not present

## 2015-11-22 DIAGNOSIS — E440 Moderate protein-calorie malnutrition: Secondary | ICD-10-CM | POA: Insufficient documentation

## 2015-11-22 DIAGNOSIS — I110 Hypertensive heart disease with heart failure: Principal | ICD-10-CM | POA: Insufficient documentation

## 2015-11-22 DIAGNOSIS — J441 Chronic obstructive pulmonary disease with (acute) exacerbation: Secondary | ICD-10-CM | POA: Insufficient documentation

## 2015-11-22 DIAGNOSIS — Z7951 Long term (current) use of inhaled steroids: Secondary | ICD-10-CM | POA: Diagnosis not present

## 2015-11-22 DIAGNOSIS — J849 Interstitial pulmonary disease, unspecified: Secondary | ICD-10-CM | POA: Insufficient documentation

## 2015-11-22 DIAGNOSIS — Z85118 Personal history of other malignant neoplasm of bronchus and lung: Secondary | ICD-10-CM | POA: Insufficient documentation

## 2015-11-22 DIAGNOSIS — Z923 Personal history of irradiation: Secondary | ICD-10-CM | POA: Insufficient documentation

## 2015-11-22 DIAGNOSIS — Z87891 Personal history of nicotine dependence: Secondary | ICD-10-CM | POA: Insufficient documentation

## 2015-11-22 DIAGNOSIS — I872 Venous insufficiency (chronic) (peripheral): Secondary | ICD-10-CM | POA: Diagnosis present

## 2015-11-22 DIAGNOSIS — Z79899 Other long term (current) drug therapy: Secondary | ICD-10-CM | POA: Insufficient documentation

## 2015-11-22 DIAGNOSIS — Z7984 Long term (current) use of oral hypoglycemic drugs: Secondary | ICD-10-CM | POA: Diagnosis not present

## 2015-11-22 DIAGNOSIS — E43 Unspecified severe protein-calorie malnutrition: Secondary | ICD-10-CM | POA: Insufficient documentation

## 2015-11-22 DIAGNOSIS — I5031 Acute diastolic (congestive) heart failure: Secondary | ICD-10-CM

## 2015-11-22 DIAGNOSIS — E118 Type 2 diabetes mellitus with unspecified complications: Secondary | ICD-10-CM | POA: Insufficient documentation

## 2015-11-22 DIAGNOSIS — R0902 Hypoxemia: Secondary | ICD-10-CM

## 2015-11-22 DIAGNOSIS — Z951 Presence of aortocoronary bypass graft: Secondary | ICD-10-CM

## 2015-11-22 DIAGNOSIS — I2510 Atherosclerotic heart disease of native coronary artery without angina pectoris: Secondary | ICD-10-CM | POA: Insufficient documentation

## 2015-11-22 DIAGNOSIS — R0602 Shortness of breath: Secondary | ICD-10-CM

## 2015-11-22 LAB — CBC
HCT: 43.3 % (ref 39.0–52.0)
HEMOGLOBIN: 13.8 g/dL (ref 13.0–17.0)
MCH: 30.1 pg (ref 26.0–34.0)
MCHC: 31.9 g/dL (ref 30.0–36.0)
MCV: 94.5 fL (ref 78.0–100.0)
PLATELETS: 219 10*3/uL (ref 150–400)
RBC: 4.58 MIL/uL (ref 4.22–5.81)
RDW: 20.2 % — ABNORMAL HIGH (ref 11.5–15.5)
WBC: 7.8 10*3/uL (ref 4.0–10.5)

## 2015-11-22 LAB — BASIC METABOLIC PANEL
Anion gap: 13 (ref 5–15)
BUN: 18 mg/dL (ref 6–20)
CHLORIDE: 97 mmol/L — AB (ref 101–111)
CO2: 29 mmol/L (ref 22–32)
CREATININE: 0.95 mg/dL (ref 0.61–1.24)
Calcium: 9 mg/dL (ref 8.9–10.3)
GFR calc non Af Amer: >60 mL/min (ref >60)
GLUCOSE: 103 mg/dL — AB (ref 65–99)
Potassium: 4.3 mmol/L (ref 3.5–5.1)
Sodium: 139 mmol/L (ref 135–145)

## 2015-11-22 LAB — BRAIN NATRIURETIC PEPTIDE: B Natriuretic Peptide: 632 pg/mL — ABNORMAL HIGH (ref 0.0–100.0)

## 2015-11-22 LAB — PROCALCITONIN

## 2015-11-22 LAB — I-STAT TROPONIN, ED: Troponin i, poc: 0.02 ng/mL (ref 0.00–0.08)

## 2015-11-22 MED ORDER — ATORVASTATIN CALCIUM 10 MG PO TABS
10.0000 mg | ORAL_TABLET | Freq: Every day | ORAL | Status: DC
  Administered 2015-11-22 – 2015-11-24 (×3): 10 mg via ORAL
  Filled 2015-11-22 (×3): qty 1

## 2015-11-22 MED ORDER — CETYLPYRIDINIUM CHLORIDE 0.05 % MT LIQD
7.0000 mL | Freq: Two times a day (BID) | OROMUCOSAL | Status: DC
  Administered 2015-11-22 – 2015-11-25 (×6): 7 mL via OROMUCOSAL

## 2015-11-22 MED ORDER — GUAIFENESIN ER 600 MG PO TB12
600.0000 mg | ORAL_TABLET | Freq: Two times a day (BID) | ORAL | Status: DC
  Administered 2015-11-22 – 2015-11-25 (×6): 600 mg via ORAL
  Filled 2015-11-22 (×6): qty 1

## 2015-11-22 MED ORDER — SODIUM CHLORIDE 0.9% FLUSH: 3.0000 mL | INTRAVENOUS | Status: DC | PRN

## 2015-11-22 MED ORDER — ALBUTEROL SULFATE (2.5 MG/3ML) 0.083% IN NEBU: 2.5000 mg | INHALATION_SOLUTION | RESPIRATORY_TRACT | Status: DC | PRN

## 2015-11-22 MED ORDER — LISINOPRIL 5 MG PO TABS
5.0000 mg | ORAL_TABLET | Freq: Every day | ORAL | Status: DC
  Administered 2015-11-22 – 2015-11-25 (×4): 5 mg via ORAL
  Filled 2015-11-22 (×4): qty 1

## 2015-11-22 MED ORDER — MOMETASONE FURO-FORMOTEROL FUM 100-5 MCG/ACT IN AERO
2.0000 | INHALATION_SPRAY | Freq: Two times a day (BID) | RESPIRATORY_TRACT | Status: DC
  Administered 2015-11-22 – 2015-11-25 (×6): 2 via RESPIRATORY_TRACT
  Filled 2015-11-22: qty 8.8

## 2015-11-22 MED ORDER — ONDANSETRON HCL 4 MG/2ML IJ SOLN: 4.0000 mg | Freq: Four times a day (QID) | INTRAMUSCULAR | Status: DC | PRN

## 2015-11-22 MED ORDER — SODIUM CHLORIDE 0.9% FLUSH
3.0000 mL | Freq: Two times a day (BID) | INTRAVENOUS | Status: DC
  Administered 2015-11-22 – 2015-11-25 (×6): 3 mL via INTRAVENOUS

## 2015-11-22 MED ORDER — TRIAMCINOLONE ACETONIDE 0.025 % EX CREA
1.0000 "application " | TOPICAL_CREAM | Freq: Every day | CUTANEOUS | Status: DC
  Administered 2015-11-23 – 2015-11-25 (×3): 1 via TOPICAL
  Filled 2015-11-22 (×2): qty 15

## 2015-11-22 MED ORDER — ALBUTEROL SULFATE (2.5 MG/3ML) 0.083% IN NEBU: 2.5000 mg | INHALATION_SOLUTION | Freq: Four times a day (QID) | RESPIRATORY_TRACT | Status: DC | PRN

## 2015-11-22 MED ORDER — VITAMIN D 1000 UNITS PO TABS: 1000.0000 [IU] | ORAL_TABLET | Freq: Every day | ORAL | Status: DC

## 2015-11-22 MED ORDER — LATANOPROST 0.005 % OP SOLN
1.0000 [drp] | Freq: Every day | OPHTHALMIC | Status: DC
  Administered 2015-11-22 – 2015-11-24 (×3): 1 [drp] via OPHTHALMIC
  Filled 2015-11-22: qty 2.5

## 2015-11-22 MED ORDER — INSULIN ASPART 100 UNIT/ML ~~LOC~~ SOLN: 0.0000 [IU] | Freq: Every day | SUBCUTANEOUS | Status: DC

## 2015-11-22 MED ORDER — ACETAMINOPHEN 325 MG PO TABS: 650.0000 mg | ORAL_TABLET | ORAL | Status: DC | PRN

## 2015-11-22 MED ORDER — FUROSEMIDE 10 MG/ML IJ SOLN
40.0000 mg | Freq: Once | INTRAMUSCULAR | Status: AC
  Administered 2015-11-22: 40 mg via INTRAVENOUS
  Filled 2015-11-22: qty 4

## 2015-11-22 MED ORDER — INSULIN ASPART 100 UNIT/ML ~~LOC~~ SOLN
0.0000 [IU] | Freq: Three times a day (TID) | SUBCUTANEOUS | Status: DC
  Administered 2015-11-23 – 2015-11-25 (×3): 2 [IU] via SUBCUTANEOUS

## 2015-11-22 MED ORDER — TIOTROPIUM BROMIDE MONOHYDRATE 18 MCG IN CAPS
18.0000 ug | ORAL_CAPSULE | Freq: Every day | RESPIRATORY_TRACT | Status: DC
  Administered 2015-11-23 – 2015-11-25 (×3): 18 ug via RESPIRATORY_TRACT
  Filled 2015-11-22: qty 5

## 2015-11-22 MED ORDER — SODIUM CHLORIDE 0.9 % IV SOLN: 250.0000 mL | INTRAVENOUS | Status: DC | PRN

## 2015-11-22 MED ORDER — ENOXAPARIN SODIUM 40 MG/0.4ML ~~LOC~~ SOLN
40.0000 mg | SUBCUTANEOUS | Status: DC
  Administered 2015-11-22 – 2015-11-24 (×3): 40 mg via SUBCUTANEOUS
  Filled 2015-11-22 (×3): qty 0.4

## 2015-11-22 MED ORDER — ENSURE ENLIVE PO LIQD
237.0000 mL | Freq: Two times a day (BID) | ORAL | Status: DC
  Administered 2015-11-22 – 2015-11-25 (×7): 237 mL via ORAL

## 2015-11-22 NOTE — H&P (Signed)
[facility] History and Physical                                                                                    Darrell Livingston, is a 82 y.o. male  MRN: [id number]   DOB - 22-Feb-1921  Admit Date - 11/21/2015  Outpatient Primary MD for the patient is Mathews Argyle, MD  Referring MD: Lita Mains / ER  PMH: Past Medical History  Diagnosis Date  . COPD (chronic obstructive pulmonary disease) ([place])   . Lung cancer ([place])   . Cardiac disorder   . Coronary artery disease   . Hypertension   . Diabetes mellitus   . COPD (chronic obstructive pulmonary disease) ([place]) 07/20/2011  . DM type 2 (diabetes mellitus, type 2) ([place]) 07/20/2011  . Hypertension 07/20/2011      PSH: Past Surgical History  Procedure Laterality Date  . Cardiac surgery    . Cholecystectomy    . Coronary angioplasty with stent placement    . Eye surgery       CC:  Chief Complaint  Patient presents with  . Shortness of Breath     HPI: Pleasant 82 year old male patient with known underlying O2 dependent COPD and interstitial lung disease (although patient typically does not wear his oxygen), known non-mall cell carcinoma left lung (completed radiation therapy), hypertension only on Lasix, diabetes on metformin, chronic venous stasis dermatitis of both legs, and malnutrition. She has a productive cough of yellow sputum which is chronic but has worsened in intensity and frequency and sputum production over the past 1-2 weeks he's also noticed increased shortness of breath with "low oxygen levels" 1 week. ACE and is reluctant to wear oxygen regularly because he has had difficulty with oxygen induced hypercarbic respiratory failure in the past. He was sent over from his primary care physician's office due to concerns of acute respiratory failure of uncertain etiology. Sons are at the bedside assisting with history.  ER Evaluation and treatment: Temp 98.4-BP 137/102-also 96 test respirations 20-O2  saturations 93% on 4 L EKG: Sinus rhythm with ventricular rate 91 bpm, QTC 44 ms, right bundle branch block, no ST segment or T-wave changes concerning for ischemia. 2 view CXR: Severe chronic interstitial and obstructive lung disease with extensive scarring in both lungs. Hypermetabolic nodule left upper lobe more apparent than on prior exams compatible with known malignancy Lab data: Na 139, K 4.3, BUN 18, creatinine 0.95, glucose 103, BNP 632, troponin 0.02, WBC 7800 and differential obtained, hemoglobin 13.8, platelets 219,000 Lasix 40 mg IV 1 dose  Review of Systems   In addition to the HPI above,  No Fever-chills, myalgias or other constitutional symptoms No Headache, changes with Vision or hearing, new weakness, tingling, numbness in any extremity, No problems swallowing food or Liquids, indigestion/reflux No Chest pain,  palpitations, orthopnea  No Abdominal pain, N/V; no melena or hematochezia, no dark tarry stools, Bowel movements are regular, No dysuria, hematuria or flank pain No new skin rashes, lesions, masses or bruises, No new joints pains-aches No recent weight gain ; and the reports patient has lost at least 20 pounds over the past several months No polyuria, polydypsia or  polyphagia,  *A full 10 point Review of Systems was done, except as stated above, all other Review of Systems were negative.  Social History Social History  Substance Use Topics  . Smoking status: Former Smoker -- 2.00 packs/day for 54 years    Types: Cigarettes    Quit date: 11/17/1996  . Smokeless tobacco: Never Used  . Alcohol Use: Yes     Comment: up to 2 drinks per day but not everyday per pt    Resides at: Private residence  Lives with: Alone but has several sons who check on him frequently and assist with his care  Ambulatory status: Without assistive devices   Family History Family History  Problem Relation Age of Onset  . Cancer Neg Hx      Prior to Admission medications     Medication Sig Start Date End Date Taking? Authorizing Provider  albuterol (PROVENTIL HFA;VENTOLIN HFA) 108 (90 BASE) MCG/ACT inhaler Inhale 1-2 puffs into the lungs every 6 (six) hours as needed for wheezing or shortness of breath.   Yes Historical Provider, MD  albuterol (PROVENTIL) (2.5 MG/3ML) 0.083% nebulizer solution Take 3 mLs (2.5 mg total) by nebulization every 2 (two) hours as needed for wheezing or shortness of breath. 04/20/15  Yes Allie Bossier, MD  atorvastatin (LIPITOR) 20 MG tablet Take 10 mg by mouth daily at 6 PM.    Yes Historical Provider, MD  budesonide-formoterol (SYMBICORT) 80-4.5 MCG/ACT inhaler Inhale 2 puffs into the lungs 2 (two) times daily.   Yes Historical Provider, MD  feeding supplement, ENSURE ENLIVE, (ENSURE ENLIVE) LIQD Take 237 mLs by mouth 2 (two) times daily between meals. 04/20/15  Yes Allie Bossier, MD  guaiFENesin (MUCINEX) 600 MG 12 hr tablet Take 1 tablet (600 mg total) by mouth 2 (two) times daily. 04/20/15  Yes Allie Bossier, MD  latanoprost (XALATAN) 0.005 % ophthalmic solution Place 1 drop into both eyes at bedtime.    Yes Historical Provider, MD  tiotropium (SPIRIVA) 18 MCG inhalation capsule Place 18 mcg into inhaler and inhale daily at 12 noon.    Yes Historical Provider, MD  triamcinolone (KENALOG) 0.025 % cream Apply 1 application topically daily at 12 noon.    Yes Historical Provider, MD  cholecalciferol (VITAMIN D) 1000 UNITS tablet Take 1,000 Units by mouth daily. Reported on 12/10/2015    Historical Provider, MD    No Known Allergies  Physical Exam  Vitals  Blood pressure 151/74, pulse 94, temperature 98.4 F (36.9 C), temperature source Oral, resp. rate 24, SpO2 96 %.   General:  In no acute distress, appears Likely ill and malnourished  Psych:  Normal affect, Denies Suicidal or Homicidal ideations, Awake Alert, Oriented X 3. Speech and thought patterns are clear and appropriate, no apparent short term memory deficits  Neuro:   No  focal neurological deficits, CN II through XII intact, Strength 5/5 all 4 extremities, Sensation intact all 4 extremities.  ENT:  Ears and Eyes appear Normal, Conjunctivae clear, PER. Moist oral mucosa without erythema or exudates. Upper denture is ill fitting and quite loose  Neck:  Supple, No lymphadenopathy appreciated  Respiratory:  Symmetrical chest wall movement, diffusely diminished air movement bilaterally, so nonspecific coarse sounds at the few faint expiratory wheezes and some basilar crackles. Her liters oxygen  Cardiac:  RRR, No Murmurs, chronic bilateral scarring and pitting LE edema noted with skin changes consistent with stasis dermatitis, no JVD, No carotid bruits, peripheral pulses palpable at 2+  Abdomen:  Positive bowel sounds, Soft, Non tender, Non distended,  No masses appreciated, no obvious hepatosplenomegaly  Skin:  No Cyanosis, Normal Skin Turgor, No Skin Rash or Bruise. Fiery red circumferential skin changes in the sock line pattern on both legs consistent with stasis dermatitis-evidence of scarring bilaterally and diffusely  Extremities: Symmetrical without obvious trauma or injury,  no effusions.  Data Review  CBC  Recent Labs Lab 11/21/2015 1142  WBC 7.8  HGB 13.8  HCT 43.3  PLT 219  MCV 94.5  MCH 30.1  MCHC 31.9  RDW 20.2*    Chemistries   Recent Labs Lab 12/04/2015 1142  NA 139  K 4.3  CL 97*  CO2 29  GLUCOSE 103*  BUN 18  CREATININE 0.95  CALCIUM 9.0    CrCl cannot be calculated (Unknown ideal weight.).  No results for input(s): TSH, T4TOTAL, T3FREE, THYROIDAB in the last 72 hours.  Invalid input(s): FREET3  Coagulation profile No results for input(s): INR, PROTIME in the last 168 hours.  No results for input(s): DDIMER in the last 72 hours.  Cardiac Enzymes No results for input(s): CKMB, TROPONINI, MYOGLOBIN in the last 168 hours.  Invalid input(s): CK  Invalid input(s): POCBNP  Urinalysis    Component Value Date/Time    COLORURINE AMBER* 04/09/2015 2027   APPEARANCEUR CLOUDY* 04/09/2015 2027   LABSPEC 1.027 04/09/2015 2027   PHURINE 5.5 04/09/2015 2027   GLUCOSEU NEGATIVE 04/09/2015 2027   HGBUR NEGATIVE 04/09/2015 2027   BILIRUBINUR SMALL* 04/09/2015 2027   KETONESUR NEGATIVE 04/09/2015 2027   PROTEINUR 100* 04/09/2015 2027   UROBILINOGEN 1.0 04/09/2015 2027   NITRITE NEGATIVE 04/09/2015 2027   LEUKOCYTESUR NEGATIVE 04/09/2015 2027    Imaging results:   Dg Chest 2 View  12/10/2015  CLINICAL DATA:  Productive cough, shortness of breath and chest congestion. COPD. EXAM: CHEST  2 VIEW COMPARISON:  04/17/2015 and 04/09/2015 and PET-CT dated 06/30/2015 and chest CT 04/09/2015 FINDINGS: Extensive areas of scarring and chronic interstitial and obstructive lung disease appears stable. The ill-defined irregular hypermetabolic nodule in the left upper lobe is better demonstrated on the current exam, with is vague area measuring approximately 21 mm in diameter. Tiny bilateral effusions are essentially unchanged. CABG. Accentuation of the thoracic kyphosis with diffuse osteopenia. Aortic atherosclerosis. Old fracture of the anterior aspect of the right third rib with sclerosis. IMPRESSION: Severe chronic interstitial and obstructive lung disease with extensive scarring in both lungs. Hypermetabolic nodule in the left upper lobe is more apparent than on prior exams, compatible with malignancy. Electronically Signed   By: Lorriane Shire M.D.   On: 12/12/2015 13:14   Dg Hand Complete Left  10/27/2015  CLINICAL DATA:  Status post fall 1 day ago. Deformities of the ring and little fingers. Initial encounter. EXAM: LEFT HAND - COMPLETE 3+ VIEW COMPARISON:  None. FINDINGS: The PIP joints of the ring and little fingers are in extreme flexion. No fracture is identified. Image bones and joints otherwise appear normal. Soft tissues are unremarkable. IMPRESSION: Extreme flexion of the PIP joints of the ring and little fingers of  unknown chronicity. Negative for fracture. Electronically Signed   By: Inge Rise M.D.   On: 10/27/2015 15:57     EKG: (Independently reviewed)  Sinus rhythm with ventricular rate 91 bpm, QTC 44 ms, right bundle branch block, no ST segment or T-wave changes concerning for ischemia.   Assessment & Plan  Principal Problem:   Acute on chronic respiratory failure with hypoxia:   A) Acute  diastolic heart failure   B) COPD/ILD  -Patient presents with symptoms progressive in nature that appear to be more typical of heart failure noting he does not have any constitutional symptoms consistent with infection and is not wheezing diffusely which would be consistent with COPD exacerbation -Admit to floor/obs -Last echocardiogram in 2012 illustrated mild LVH with probable mild RV dysfunction did not mention if has underlying pulmonary hypertension; will repeat echocardiogram this admission -BNP elevated at 632; repeat in a.m. -Was on Lasix 20 mg by mouth daily and has been given a one-time dose of IV Lasix by EDP and his artery had significant urinary output; will give an additional dose later this evening and rounding physician can make determination in a.m. if additional IV doses are indicated -I have added low-dose lisinopril at 5 mg daily -Continue preadmission MDIs and nebs -Monitor for hypercarbia; patient with issues in past if utilizes to high dose of oxygen; I discussed with patient and family that most likely he would only need oxygen with exertion and potentially while sleeping  Active Problems:   Essential hypertension -Only on Lasix prior to admission; ACE inhibitor added as above    Diabetes mellitus, controlled -Preadmission metformin on hold -Check CBGs and provide SSI    Non-small cell carcinoma of left lung -Patient has completed radiation therapy    Moderate malnutrition  -Continue preadmission supplementation -Family mentions 20 pound weight loss in the past several  months -Nutrition consultation    DVT Prophylaxis: Lovenox  Family Communication:   Sons at bedside  Code Status: DO NOT RESUSCITATE    Condition:  Stable  Discharge disposition: Suspect discharged back to the previous home environment once medically stable  Time spent in minutes : 60      ELLIS,ALLISON L. ANP on 12/03/2015 at 2:43 PM  You may contact me by going to [email or web address] - password [email or web address]  I am available from 7a-7p but please confirm I am on the schedule by going to Amion as above.   After 7p please contact night coverage person covering me after hours  [facility]

## 2015-11-22 NOTE — ED Notes (Signed)
Patient transported to X-ray 

## 2015-11-22 NOTE — ED Notes (Signed)
Pt sts increased SOB and gen weakness with low O2

## 2015-11-22 NOTE — ED Provider Notes (Signed)
CSN: [id number]     Arrival date & time 11/17/2015  1133 History   First MD Initiated Contact with Patient 11/18/2015 1207     Chief Complaint  Patient presents with  . Shortness of Breath     (Consider location/radiation/quality/duration/timing/severity/associated sxs/prior Treatment) HPI Patient presents with increased shortness of breath and cough for the past week. Shortness of breath is worse with exertion. Son states the patient's cough has had some yellow sputum. No fever or chills. Patient has ongoing bilateral lower extremity swelling. This is unchanged. Patient was seen by his primary physician and had saturations in the 80s. Refer to the emergency department. Patient has oxygen at home but rarely uses it. Denies current chest pain. Past Medical History  Diagnosis Date  . COPD (chronic obstructive pulmonary disease) ([place])   . Lung cancer ([place])   . Cardiac disorder   . Coronary artery disease   . Hypertension   . Diabetes mellitus   . COPD (chronic obstructive pulmonary disease) ([place]) 07/20/2011  . DM type 2 (diabetes mellitus, type 2) ([place]) 07/20/2011  . Hypertension 07/20/2011   Past Surgical History  Procedure Laterality Date  . Cardiac surgery    . Cholecystectomy    . Coronary angioplasty with stent placement    . Eye surgery     Family History  Problem Relation Age of Onset  . Cancer Neg Hx    Social History  Substance Use Topics  . Smoking status: Former Smoker -- 2.00 packs/day for 54 years    Types: Cigarettes    Quit date: 11/17/1996  . Smokeless tobacco: Never Used  . Alcohol Use: Yes     Comment: up to 2 drinks per day but not everyday per pt    Review of Systems  Constitutional: Negative for fever and chills.  Respiratory: Positive for cough and shortness of breath. Negative for wheezing.   Cardiovascular: Positive for leg swelling. Negative for chest pain and palpitations.  Gastrointestinal: Negative for nausea, vomiting, abdominal pain, diarrhea and  constipation.  Musculoskeletal: Positive for arthralgias. Negative for myalgias, back pain, neck pain and neck stiffness.  Skin: Positive for wound. Negative for rash.  Neurological: Negative for dizziness, weakness, light-headedness, numbness and headaches.  All other systems reviewed and are negative.     Allergies  Review of patient's allergies indicates no known allergies.  Home Medications   Prior to Admission medications   Medication Sig Start Date End Date Taking? Authorizing Provider  albuterol (PROVENTIL HFA;VENTOLIN HFA) 108 (90 BASE) MCG/ACT inhaler Inhale 1-2 puffs into the lungs every 6 (six) hours as needed for wheezing or shortness of breath.   Yes Historical Provider, MD  albuterol (PROVENTIL) (2.5 MG/3ML) 0.083% nebulizer solution Take 3 mLs (2.5 mg total) by nebulization every 2 (two) hours as needed for wheezing or shortness of breath. 04/20/15  Yes Allie Bossier, MD  atorvastatin (LIPITOR) 20 MG tablet Take 10 mg by mouth daily at 6 PM.    Yes Historical Provider, MD  budesonide-formoterol (SYMBICORT) 80-4.5 MCG/ACT inhaler Inhale 2 puffs into the lungs 2 (two) times daily.   Yes Historical Provider, MD  feeding supplement, ENSURE ENLIVE, (ENSURE ENLIVE) LIQD Take 237 mLs by mouth 2 (two) times daily between meals. 04/20/15  Yes Allie Bossier, MD  guaiFENesin (MUCINEX) 600 MG 12 hr tablet Take 1 tablet (600 mg total) by mouth 2 (two) times daily. 04/20/15  Yes Allie Bossier, MD  latanoprost (XALATAN) 0.005 % ophthalmic solution Place 1 drop into both  eyes at bedtime.    Yes Historical Provider, MD  tiotropium (SPIRIVA) 18 MCG inhalation capsule Place 18 mcg into inhaler and inhale daily at 12 noon.    Yes Historical Provider, MD  triamcinolone (KENALOG) 0.025 % cream Apply 1 application topically daily at 12 noon.    Yes Historical Provider, MD  cholecalciferol (VITAMIN D) 1000 UNITS tablet Take 1,000 Units by mouth daily. Reported on 11/20/2015    Historical Provider, MD    BP 106/70 mmHg  Pulse 63  Temp(Src) 98.4 F (36.9 C) (Oral)  Resp 18  Ht '5\' 9"'$  (1.753 m)  Wt 154 lb 4.8 oz (69.99 kg)  BMI 22.78 kg/m2  SpO2 91% Physical Exam  Constitutional: He is oriented to person, place, and time. He appears well-developed and well-nourished. No distress.  HENT:  Head: Normocephalic and atraumatic.  Mouth/Throat: Oropharynx is clear and moist. No oropharyngeal exudate.  Eyes: EOM are normal. Pupils are equal, round, and reactive to light.  Neck: Normal range of motion. Neck supple.  Cardiovascular: Normal rate and regular rhythm.  Exam reveals no gallop and no friction rub.   No murmur heard. Pulmonary/Chest: Effort normal. No respiratory distress. He has no wheezes. He has rales (Rales diffusely).  Abdominal: Soft. Bowel sounds are normal. He exhibits no distension and no mass. There is no tenderness. There is no rebound and no guarding.  Musculoskeletal: Normal range of motion. He exhibits edema. He exhibits no tenderness.  3+ bilateral pitting edema. No asymmetry or calf tenderness. Chronic bilateral lower extremity discoloration due to vascular insufficiency. No evidence of cellulitis. Patient has splint in place to the left hand fourth and fifth digits.   Neurological: He is alert and oriented to person, place, and time.  Moves all extremities without focal deficit. Sensation is intact. Unsteady gait  Skin: Skin is warm and dry. No rash noted. No erythema.  Psychiatric: He has a normal mood and affect. His behavior is normal.  Nursing note and vitals reviewed.   ED Course  Procedures (including critical care time) Labs Review Labs Reviewed  BASIC METABOLIC PANEL - Abnormal; Notable for the following:    Chloride 97 (*)    Glucose, Bld 103 (*)    All other components within normal limits  CBC - Abnormal; Notable for the following:    RDW 20.2 (*)    All other components within normal limits  BRAIN NATRIURETIC PEPTIDE - Abnormal; Notable for the  following:    B Natriuretic Peptide 632.0 (*)    All other components within normal limits  BASIC METABOLIC PANEL - Abnormal; Notable for the following:    Potassium 3.4 (*)    Chloride 92 (*)    CO2 39 (*)    Calcium 8.7 (*)    All other components within normal limits  BRAIN NATRIURETIC PEPTIDE - Abnormal; Notable for the following:    B Natriuretic Peptide 717.3 (*)    All other components within normal limits  GLUCOSE, CAPILLARY - Abnormal; Notable for the following:    Glucose-Capillary 120 (*)    All other components within normal limits  PROCALCITONIN  GLUCOSE, CAPILLARY  I-STAT TROPOININ, ED    Imaging Review Dg Chest 2 View  11/23/2015  CLINICAL DATA:  Acute diastolic heart failure, history of non-small cell lung malignancy on the left, interstitial lung disease, Celsius OPD, acute on chronic respiratory failure. EXAM: CHEST  2 VIEW COMPARISON:  PA and lateral chest of November 22, 2015 FINDINGS: The lungs remain hyperinflated. The  interstitial markings remain increased. Areas of confluent density in the right mid and upper lung and at the left lung base persist. There is no significant pleural effusion. There is no pneumothorax or pneumomediastinum. The heart is top-normal in size. The pulmonary vascularity is not engorged. There are stable post CABG changes. IMPRESSION: Chronic interstitial lung disease with extensive pleuroparenchymal scarring. Overall there has not been significant interval change in the appearance of the chest since yesterday's study. Areas of subtle nodular density bilaterally measured on yesterday's study are not as apparent today. Electronically Signed   By: Nikhil Osei  [place] M.D.   On: 11/23/2015 08:12   Dg Chest 2 View  12/09/2015  CLINICAL DATA:  Productive cough, shortness of breath and chest congestion. COPD. EXAM: CHEST  2 VIEW COMPARISON:  04/17/2015 and 04/09/2015 and PET-CT dated 06/30/2015 and chest CT 04/09/2015 FINDINGS: Extensive areas of scarring and  chronic interstitial and obstructive lung disease appears stable. The ill-defined irregular hypermetabolic nodule in the left upper lobe is better demonstrated on the current exam, with is vague area measuring approximately 21 mm in diameter. Tiny bilateral effusions are essentially unchanged. CABG. Accentuation of the thoracic kyphosis with diffuse osteopenia. Aortic atherosclerosis. Old fracture of the anterior aspect of the right third rib with sclerosis. IMPRESSION: Severe chronic interstitial and obstructive lung disease with extensive scarring in both lungs. Hypermetabolic nodule in the left upper lobe is more apparent than on prior exams, compatible with malignancy. Electronically Signed   By: Lorriane Shire M.D.   On: 11/24/2015 13:14   I have personally reviewed and evaluated these images and lab results as part of my medical decision-making.   EKG Interpretation   Date/Time:  Monday November 22 2015 11:41:52 EDT Ventricular Rate:  91 PR Interval:  188 QRS Duration: 132 QT Interval:  394 QTC Calculation: 484 R Axis:   44 Text Interpretation:  Sinus rhythm with Premature supraventricular  complexes Right bundle branch block Abnormal ECG Confirmed by Norvin Ohlin   MD, Doree Kuehne ([id number]) on 12/10/2015 1:50:44 PM      MDM   Final diagnoses:  SOB (shortness of breath)  Hypervolemia, unspecified hypervolemia type  Hypoxia    Patient with elevation in BNP as well as evidence of fluid overload on exam. Thinks this is likely the cause of his dyspnea and hypoxia. Given Lasix in the emergency department with good diuresis. Discussed with Dr. Barbaraann Faster of [facility] hospitalist. [facility] admit.     Julianne Rice, MD 11/23/15 1421

## 2015-11-22 NOTE — ED Notes (Signed)
Attempted report to floor.  

## 2015-11-23 ENCOUNTER — Other Ambulatory Visit (HOSPITAL_COMMUNITY): Payer: Medicare Other

## 2015-11-23 ENCOUNTER — Encounter (HOSPITAL_COMMUNITY): Payer: Self-pay | Admitting: *Deleted

## 2015-11-23 ENCOUNTER — Observation Stay (HOSPITAL_COMMUNITY): Payer: Medicare Other

## 2015-11-23 DIAGNOSIS — I5031 Acute diastolic (congestive) heart failure: Secondary | ICD-10-CM | POA: Diagnosis not present

## 2015-11-23 DIAGNOSIS — I8311 Varicose veins of right lower extremity with inflammation: Secondary | ICD-10-CM

## 2015-11-23 DIAGNOSIS — J9621 Acute and chronic respiratory failure with hypoxia: Secondary | ICD-10-CM | POA: Diagnosis not present

## 2015-11-23 DIAGNOSIS — J449 Chronic obstructive pulmonary disease, unspecified: Secondary | ICD-10-CM

## 2015-11-23 DIAGNOSIS — I8312 Varicose veins of left lower extremity with inflammation: Secondary | ICD-10-CM

## 2015-11-23 LAB — BASIC METABOLIC PANEL
Anion gap: 10 (ref 5–15)
BUN: 15 mg/dL (ref 6–20)
CALCIUM: 8.7 mg/dL — AB (ref 8.9–10.3)
CHLORIDE: 92 mmol/L — AB (ref 101–111)
CO2: 39 mmol/L — AB (ref 22–32)
CREATININE: 0.92 mg/dL (ref 0.61–1.24)
GFR calc Af Amer: >60 mL/min (ref >60)
GFR calc non Af Amer: >60 mL/min (ref >60)
GLUCOSE: 91 mg/dL (ref 65–99)
Potassium: 3.4 mmol/L — ABNORMAL LOW (ref 3.5–5.1)
Sodium: 141 mmol/L (ref 135–145)

## 2015-11-23 LAB — GLUCOSE, CAPILLARY
GLUCOSE-CAPILLARY: 91 mg/dL (ref 65–99)
GLUCOSE-CAPILLARY: 98 mg/dL (ref 65–99)
Glucose-Capillary: 120 mg/dL — ABNORMAL HIGH (ref 65–99)
Glucose-Capillary: 177 mg/dL — ABNORMAL HIGH (ref 65–99)

## 2015-11-23 LAB — BRAIN NATRIURETIC PEPTIDE: B Natriuretic Peptide: 717.3 pg/mL — ABNORMAL HIGH (ref 0.0–100.0)

## 2015-11-23 MED ORDER — POTASSIUM CHLORIDE CRYS ER 20 MEQ PO TBCR
30.0000 meq | EXTENDED_RELEASE_TABLET | Freq: Once | ORAL | Status: AC
  Administered 2015-11-23: 30 meq via ORAL
  Filled 2015-11-23: qty 1

## 2015-11-23 MED ORDER — FUROSEMIDE 40 MG PO TABS
40.0000 mg | ORAL_TABLET | Freq: Two times a day (BID) | ORAL | Status: DC
  Administered 2015-11-23 – 2015-11-25 (×4): 40 mg via ORAL
  Filled 2015-11-23 (×5): qty 1

## 2015-11-23 NOTE — Evaluation (Signed)
Physical Therapy Evaluation Patient Details Name: Darrell Livingston MRN: [id number] DOB: [birth year] Today's Date: 11/23/2015   History of Present Illness  82 year old white male with past medical history of COPD and diabetes admitted for progressively worsening shortness of breath for the last 1-2 weeks on 4/10 and found to be in acute respiratory failure from suspected CHF  Clinical Impression  Patient demonstrates deficits in functional mobility as indicated below. Will need continued skilled PT to address deficits and maximize function. Will see as indicated and progress as tolerated.  OF NOTE: patient with desaturation upon entering room with resp therapist. Patient satting low 80s at rest on 2 liters, increased to 4 liters with saturations reaching 90%, patient with productive coughing spell and saturations increased to 91% on 4 liters. Nsg aware.     Follow Up Recommendations Home health PT;Supervision/Assistance - 24 hour    Equipment Recommendations  Rolling walker with 5" wheels    Recommendations for Other Services       Precautions / Restrictions Precautions Precautions: Fall Precaution Comments: watch saturations       Mobility  Bed Mobility Overal bed mobility: Needs Assistance Bed Mobility: Supine to Sit     Supine to sit: Min guard     General bed mobility comments: Min guard for safety, increased time to perform  Transfers Overall transfer level: Needs assistance Equipment used: Rolling walker (2 wheeled) Transfers: Sit to/from Stand Sit to Stand: Min guard         General transfer comment: Min guard for stability, VCs for hand placement and safety with transfer to standing. Cues for upright posture  Ambulation/Gait Ambulation/Gait assistance: Min guard Ambulation Distance (Feet): 30 Feet Assistive device: Rolling walker (2 wheeled) Gait Pattern/deviations: Step-to pattern;Decreased stride length;Narrow base of support;Trunk flexed Gait velocity:  decreased Gait velocity interpretation: Below normal speed for age/gender General Gait Details: modest instability noted, heavy reliance on RW. Patient was able to navigate around objects in room with RW  Stairs            Wheelchair Mobility    Modified Rankin (Stroke Patients Only)       Balance Overall balance assessment: Needs assistance Sitting-balance support: Feet supported Sitting balance-Leahy Scale: Fair     Standing balance support: During functional activity Standing balance-Leahy Scale: Fair                               Pertinent Vitals/Pain Pain Assessment: 0-10 Pain Score: 5  Pain Location: 2nd 3rd digit left hand Pain Descriptors / Indicators: Sore Pain Intervention(s): Monitored during session    Home Living Family/patient expects to be discharged to:: Private residence Living Arrangements: Children Available Help at Discharge: Personal care attendant;Available PRN/intermittently Type of Home: House Home Access: Stairs to enter Entrance Stairs-Rails: None Entrance Stairs-Number of Steps: 2 Home Layout: Two level;Able to live on main level with bedroom/bathroom Home Equipment: Gilford Rile - 2 wheels;Cane - quad;Shower seat Additional Comments: has comfort keepers 4 hours a day    Prior Function Level of Independence: Independent with assistive device(s)         Comments: pt was living alone, using cane, driving to the grocery store, son spent the night approximately half the time. Pt at baseline has slow speech and report of several recent falls. Performing ADLs on his own     Hand Dominance   Dominant Hand: Left    Extremity/Trunk Assessment   Upper Extremity Assessment: Defer  to OT evaluation           Lower Extremity Assessment: Generalized weakness         Communication   Communication: HOH  Cognition Arousal/Alertness: Awake/alert Behavior During Therapy: Flat affect Overall Cognitive Status: No  family/caregiver present to determine baseline cognitive functioning                      General Comments General comments (skin integrity, edema, etc.): ambulated in room on 4 liters with saturations 91%. Patient with productive wet cough upon changing positions. Patient just received respiratory treatment.  Educated on pursed lip breathing with nasal inhalation secondary to desaturations. Nsg aware    Exercises        Assessment/Plan    PT Assessment Patient needs continued PT services  PT Diagnosis Difficulty walking;Abnormality of gait;Generalized weakness;Acute pain   PT Problem List Decreased strength;Decreased activity tolerance;Decreased balance;Decreased mobility;Cardiopulmonary status limiting activity;Pain  PT Treatment Interventions DME instruction;Gait training;Stair training;Functional mobility training;Therapeutic activities;Therapeutic exercise;Balance training;Patient/family education   PT Goals (Current goals can be found in the Care Plan section) Acute Rehab PT Goals Patient Stated Goal: to go home PT Goal Formulation: With patient Time For Goal Achievement: 12/07/15 Potential to Achieve Goals: Good    Frequency Min 3X/week   Barriers to discharge        Co-evaluation               End of Session Equipment Utilized During Treatment: Oxygen;Gait belt Activity Tolerance: Patient limited by fatigue Patient left: in chair;with call bell/phone within reach;with chair alarm set Nurse Communication: Mobility status    Functional Assessment Tool Used: clinical judgement Functional Limitation: Mobility: Walking and moving around Mobility: Walking and Moving Around Current Status [phone]): At least 20 percent but less than 40 percent impaired, limited or restricted Mobility: Walking and Moving Around Goal Status [phone]): At least 1 percent but less than 20 percent impaired, limited or restricted    Time: 1210-1230 PT Time Calculation (min) (ACUTE  ONLY): 20 min   Charges:   PT Evaluation $PT Eval Moderate Complexity: 1 Procedure     PT G Codes:   PT G-Codes **NOT FOR INPATIENT CLASS** Functional Assessment Tool Used: clinical judgement Functional Limitation: Mobility: Walking and moving around Mobility: Walking and Moving Around Current Status ([id number]): At least 20 percent but less than 40 percent impaired, limited or restricted Mobility: Walking and Moving Around Goal Status [phone]): At least 1 percent but less than 20 percent impaired, limited or restricted    Duncan Dull 11/23/2015, 1:53 PM Alben Deeds, [facility] DPT  [phone]

## 2015-11-23 NOTE — Progress Notes (Signed)
Initial Nutrition Assessment  DOCUMENTATION CODES:   Severe malnutrition in context of chronic illness  INTERVENTION:  Provide Ensure Enlive po TID, each supplement provides 350 kcal and 20 grams of protein Encourage PO intake Provided and discussed "High Calorie High Protein Nutrition Therapy" handout from the Academy of [employer] Dietetics.    NUTRITION DIAGNOSIS:   Malnutrition related to poor appetite, chronic illness as evidenced by percent weight loss, severe depletion of body fat, severe depletion of muscle mass, energy intake < 75% for > or equal to 1 month.   GOAL:   Patient will meet greater than or equal to 90% of their needs   MONITOR:   PO intake, Supplement acceptance, Skin, I & O's, Weight trends, Labs  REASON FOR ASSESSMENT:   Consult, Malnutrition Screening Tool Assessment of nutrition requirement/status  ASSESSMENT:   82 y.o. male with a Past Medical History of COPD, lung cancer, CAD, HTN, DM, COPD, who presents with acute on chronic respiratory failure likely from CHF decompensation. Of note it appears the patient's cancer continues to progress despite recent radiation therapy.   Pt states that his weight loss is related to having a poor appetite. He denies having any issues with nausea, vomiting of indigestion. He reports having some difficulty swallowing for a period of time, but this has improved and he is able to eat soft foods now.  He reports eating less than 50% less than usual over the past several months. He eats a bowl of cereal for brunch, drinks Ensure once daily, and sometimes eats dinner. Per nursing notes he ate 25% of breakfast; pt drank Ensure after.  RD emphasized the importance of adequate nutrition intake. Encouraged eating more often throughout the day and increasing intake of Ensure. Discussed ways to increase calories and protein content at meals and snacks. Provided and discussed "High Calorie High Protein Nutrition Therapy" handout  from the Academy of Nutrition and Dietetics (AND). Also provided "High Calorie, High Protein Recipes" and "Suggestions for Increasing Calories and Protein" handouts from AND. RD name and contact information provided. Encouraged pt to share handouts with his sons.   Labs: low chloride, low calcium   Diet Order:  Diet Carb Modified Fluid consistency:: Thin; Room service appropriate?: Yes  Skin:  Reviewed, no issues  Last BM:  4/10  Height:   Ht Readings from Last 1 Encounters:  12/03/2015 '5\' 9"'$  (1.753 m)    Weight:   Wt Readings from Last 1 Encounters:  11/23/15 154 lb 4.8 oz (69.99 kg)    Ideal Body Weight:  72.7 kg  BMI:  Body mass index is 22.78 kg/(m^2).  Estimated Nutritional Needs:   Kcal:  2100--2300  Protein:  100-110 grams  Fluid:  2.1 L/day  EDUCATION NEEDS:   Education needs addressed  Scarlette Ar RD, LDN Inpatient Clinical Dietitian Pager: [phone] After Hours Pager: [phone]

## 2015-11-23 NOTE — Progress Notes (Signed)
PROGRESS NOTE  Darrell Livingston:[id number] DOB: 1920-10-21 DOA: 12/01/2015 PCP: Mathews Argyle, MD  HPI/Recap of past 70 hours: 82 year old white male with past medical history of COPD and diabetes admitted for progressively worsening shortness of breath for the last 1-2 weeks on 4/10 and found to be in acute respiratory failure from suspected CHF. Patient given one dose of Lasix and admitted to the hospitalist service.  Assessment/Plan: Principal Problem:   Acute on chronic respiratory failure with hypoxia secondary to underlying COPD plus, underlying interstitial lung disease, non-small cell carcinoma of the left lung and acute heart failure: Improving  Acute diastolic heart failure (HCC):No previous hx of heart failure, normal echo in 2012.  However, with one dose of Lasix, diuresed 3 lit.  Awaiting repeat echo.  Have started po lasix.  No evidence of acute COPD exacerbation. Responding well, we'll start to ambulate: Nutrition to see    Essential hypertension: Blood pressure stable   Non-small cell carcinoma of left lung (HCC)   Moderate malnutrition (HCC) nutrition to see    Chronic venous stasis dermatitis of both lower extremities   Diabetes mellitus with complication ([place]): CBG stable, under 100   Code Status: DO NOT RESUSCITATE   Family Communication: Spoke with son by phone   Disposition Plan: Potentially able to be discharged tomorrow once back to baseline and echo complete    Consultants:  None   Procedures:  Echocardiogram pending   Antibiotics:  None    Objective: BP 141/77 mmHg  Pulse 78  Temp(Src) 98.4 F (36.9 C) (Oral)  Resp 18  Ht '5\' 9"'$  (1.753 m)  Wt 69.99 kg (154 lb 4.8 oz)  BMI 22.78 kg/m2  SpO2 92%  Intake/Output Summary (Last 24 hours) at 11/23/15 1009 Last data filed at 11/23/15 0901  Gross per 24 hour  Intake    960 ml  Output   3775 ml  Net  -2815 ml   Filed Weights   12/05/2015 1615 11/23/15 0535  Weight: 77.157 kg  (170 lb 1.6 oz) 69.99 kg (154 lb 4.8 oz)    Exam:   General:  Alert and oriented 2, no acute distress   Cardiovascular: Regular rate and rhythm, S1-S2, soft   Respiratory: Decreased breath sounds throughout, mild expiratory wheeze   Abdomen: Soft, nontender, nondistended, positive bowel sounds   Musculoskeletal: Trace pitting edema    Data Reviewed: Basic Metabolic Panel:  Recent Labs Lab 11/20/2015 1142 11/23/15 0441  NA 139 141  K 4.3 3.4*  CL 97* 92*  CO2 29 39*  GLUCOSE 103* 91  BUN 18 15  CREATININE 0.95 0.92  CALCIUM 9.0 8.7*   Liver Function Tests: No results for input(s): AST, ALT, ALKPHOS, BILITOT, PROT, ALBUMIN in the last 168 hours. No results for input(s): LIPASE, AMYLASE in the last 168 hours. No results for input(s): AMMONIA in the last 168 hours. CBC:  Recent Labs Lab 11/19/2015 1142  WBC 7.8  HGB 13.8  HCT 43.3  MCV 94.5  PLT 219   Cardiac Enzymes:   No results for input(s): CKTOTAL, CKMB, CKMBINDEX, TROPONINI in the last 168 hours. BNP (last 3 results)  Recent Labs  04/09/15 1949 11/30/2015 1227 11/23/15 0400  BNP 436.6* 632.0* 717.3*    ProBNP (last 3 results) No results for input(s): PROBNP in the last 8760 hours.  CBG:  Recent Labs Lab 11/23/15 0603  GLUCAP 91    No results found for this or any previous visit (from the past 240 hour(s)).  Studies: Dg Chest 2 View  11/23/2015  CLINICAL DATA:  Acute diastolic heart failure, history of non-small cell lung malignancy on the left, interstitial lung disease, Celsius OPD, acute on chronic respiratory failure. EXAM: CHEST  2 VIEW COMPARISON:  PA and lateral chest of November 22, 2015 FINDINGS: The lungs remain hyperinflated. The interstitial markings remain increased. Areas of confluent density in the right mid and upper lung and at the left lung base persist. There is no significant pleural effusion. There is no pneumothorax or pneumomediastinum. The heart is top-normal in size. The  pulmonary vascularity is not engorged. There are stable post CABG changes. IMPRESSION: Chronic interstitial lung disease with extensive pleuroparenchymal scarring. Overall there has not been significant interval change in the appearance of the chest since yesterday's study. Areas of subtle nodular density bilaterally measured on yesterday's study are not as apparent today. Electronically Signed   By: David  Martinique M.D.   On: 11/23/2015 08:12   Dg Chest 2 View  12/11/2015  CLINICAL DATA:  Productive cough, shortness of breath and chest congestion. COPD. EXAM: CHEST  2 VIEW COMPARISON:  04/17/2015 and 04/09/2015 and PET-CT dated 06/30/2015 and chest CT 04/09/2015 FINDINGS: Extensive areas of scarring and chronic interstitial and obstructive lung disease appears stable. The ill-defined irregular hypermetabolic nodule in the left upper lobe is better demonstrated on the current exam, with is vague area measuring approximately 21 mm in diameter. Tiny bilateral effusions are essentially unchanged. CABG. Accentuation of the thoracic kyphosis with diffuse osteopenia. Aortic atherosclerosis. Old fracture of the anterior aspect of the right third rib with sclerosis. IMPRESSION: Severe chronic interstitial and obstructive lung disease with extensive scarring in both lungs. Hypermetabolic nodule in the left upper lobe is more apparent than on prior exams, compatible with malignancy. Electronically Signed   By: Lorriane Shire M.D.   On: 12/09/2015 13:14    Scheduled Meds: . antiseptic oral rinse  7 mL Mouth Rinse BID  . atorvastatin  10 mg Oral q1800  . enoxaparin (LOVENOX) injection  40 mg Subcutaneous Q24H  . feeding supplement (ENSURE ENLIVE)  237 mL Oral BID BM  . guaiFENesin  600 mg Oral BID  . insulin aspart  0-5 Units Subcutaneous QHS  . insulin aspart  0-9 Units Subcutaneous TID WC  . latanoprost  1 drop Both Eyes QHS  . lisinopril  5 mg Oral Daily  . mometasone-formoterol  2 puff Inhalation BID  .  sodium chloride flush  3 mL Intravenous Q12H  . tiotropium  18 mcg Inhalation Q1200  . triamcinolone  1 application Topical [id number]    Continuous Infusions:    Time spent: 25 minutes  [facility] Pager [phone] . If 7PM-7AM, please contact night-coverage at [email or web address], password [facility] 11/23/2015, 10:09 AM

## 2015-11-24 ENCOUNTER — Observation Stay (HOSPITAL_BASED_OUTPATIENT_CLINIC_OR_DEPARTMENT_OTHER): Payer: Medicare Other

## 2015-11-24 DIAGNOSIS — I509 Heart failure, unspecified: Secondary | ICD-10-CM

## 2015-11-24 DIAGNOSIS — E43 Unspecified severe protein-calorie malnutrition: Secondary | ICD-10-CM | POA: Insufficient documentation

## 2015-11-24 LAB — GLUCOSE, CAPILLARY
GLUCOSE-CAPILLARY: 100 mg/dL — AB (ref 65–99)
GLUCOSE-CAPILLARY: 176 mg/dL — AB (ref 65–99)
GLUCOSE-CAPILLARY: 98 mg/dL (ref 65–99)
Glucose-Capillary: 97 mg/dL (ref 65–99)

## 2015-11-24 LAB — BASIC METABOLIC PANEL
Anion gap: 9 (ref 5–15)
BUN: 14 mg/dL (ref 6–20)
CHLORIDE: 94 mmol/L — AB (ref 101–111)
CO2: 39 mmol/L — ABNORMAL HIGH (ref 22–32)
Calcium: 8.7 mg/dL — ABNORMAL LOW (ref 8.9–10.3)
Creatinine, Ser: 0.72 mg/dL (ref 0.61–1.24)
GFR calc Af Amer: >60 mL/min (ref >60)
GFR calc non Af Amer: >60 mL/min (ref >60)
GLUCOSE: 87 mg/dL (ref 65–99)
POTASSIUM: 3.7 mmol/L (ref 3.5–5.1)
Sodium: 142 mmol/L (ref 135–145)

## 2015-11-24 LAB — COMPREHENSIVE METABOLIC PANEL
ALBUMIN: 2.6 g/dL — AB (ref 3.5–5.0)
ALK PHOS: 80 U/L (ref 38–126)
ALT: 22 U/L (ref 17–63)
ANION GAP: 9 (ref 5–15)
AST: 29 U/L (ref 15–41)
BUN: 14 mg/dL (ref 6–20)
CALCIUM: 9 mg/dL (ref 8.9–10.3)
CHLORIDE: 92 mmol/L — AB (ref 101–111)
CO2: 42 mmol/L — AB (ref 22–32)
Creatinine, Ser: 0.85 mg/dL (ref 0.61–1.24)
GFR calc Af Amer: >60 mL/min (ref >60)
GFR calc non Af Amer: >60 mL/min (ref >60)
GLUCOSE: 110 mg/dL — AB (ref 65–99)
Potassium: 4.1 mmol/L (ref 3.5–5.1)
SODIUM: 143 mmol/L (ref 135–145)
Total Bilirubin: 0.9 mg/dL (ref 0.3–1.2)
Total Protein: 6.9 g/dL (ref 6.5–8.1)

## 2015-11-24 LAB — CBC WITH DIFFERENTIAL/PLATELET
BASOS ABS: 0 10*3/uL (ref 0.0–0.1)
BASOS PCT: 0 %
EOS ABS: 0 10*3/uL (ref 0.0–0.7)
EOS PCT: 0 %
HCT: 45.5 % (ref 39.0–52.0)
Hemoglobin: 13.8 g/dL (ref 13.0–17.0)
Lymphocytes Relative: 10 %
Lymphs Abs: 0.9 10*3/uL (ref 0.7–4.0)
MCH: 29.2 pg (ref 26.0–34.0)
MCHC: 30.3 g/dL (ref 30.0–36.0)
MCV: 96.4 fL (ref 78.0–100.0)
MONO ABS: 1 10*3/uL (ref 0.1–1.0)
Monocytes Relative: 11 %
Neutro Abs: 7 10*3/uL (ref 1.7–7.7)
Neutrophils Relative %: 79 %
PLATELETS: 213 10*3/uL (ref 150–400)
RBC: 4.72 MIL/uL (ref 4.22–5.81)
RDW: 19.7 % — AB (ref 11.5–15.5)
WBC: 8.9 10*3/uL (ref 4.0–10.5)

## 2015-11-24 LAB — PROTIME-INR
INR: 1.08 (ref 0.00–1.49)
PROTHROMBIN TIME: 14.2 s (ref 11.6–15.2)

## 2015-11-24 LAB — ECHOCARDIOGRAM COMPLETE
HEIGHTINCHES: 69 in
Weight: 2619.2 oz

## 2015-11-24 NOTE — Care Management Note (Signed)
Case Management Note  Patient Details  Name: Darrell Livingston MRN: [id number] Date of Birth: 04-08-1921  Subjective/Objective:      Admitted with CHF             Action/Plan: Patient lives at home alone, Son Darrell Livingston is very supportive; CM received permission to talk to his son Darrell Livingston; Patient is active with [facility] for [facility] /PT and has additional support at home with [employer]. No needs identified   at this time. Orders placed to resume services with [facility] at discharge Expected Discharge Date:     Possibly 11/24/2015             Expected Discharge Plan:  [place]  Discharge planning Services  CM Consult Choice offered to:  Patient, Adult Children  HH Arranged:  RN, PT [facility] Agency:  [place]  Status of Service:  In process, will continue to follow  Sherrilyn Rist [phone] 11/24/2015, 3:11 PM

## 2015-11-24 NOTE — Progress Notes (Signed)
  Echocardiogram 2D Echocardiogram has been performed.  Darrell Livingston 11/24/2015, 12:46 PM

## 2015-11-24 NOTE — Care Management Obs Status (Signed)
[facility] NOTIFICATION   Patient Details  Name: DURELL LOFASO MRN: [id number] Date of Birth: 1921/03/31   Medicare Observation Status Notification Given:  Yes    Royston Bake, RN 11/24/2015, 2:14 PM

## 2015-11-24 NOTE — Progress Notes (Signed)
Physical Therapy Treatment Patient Details Name: Darrell Livingston MRN: [id number] DOB: 1920/08/20 Today's Date: 11/24/2015    History of Present Illness 82 year old white male with past medical history of COPD and diabetes admitted for progressively worsening shortness of breath for the last 1-2 weeks on 4/10 and found to be in acute respiratory failure from suspected CHF    PT Comments    Patient seen for mobility progression and functional tasks. Patient with desaturations during session on 4 liters. Patient at rest on 4 liters sats 86%, dropped to low 80s with activity but once OOB in chair increased to 89% on 4 liters. Patient with some increased difficulty in sequencing and performing basic tasks this session. Max verbal cues required. Patient fatigues quickly. Nsg notified, will continue to see and progress as tolerated.   Follow Up Recommendations  Home health PT;Supervision/Assistance - 24 hour     Equipment Recommendations  Rolling walker with 5" wheels    Recommendations for Other Services       Precautions / Restrictions Precautions Precautions: Fall Precaution Comments: watch saturations     Mobility  Bed Mobility Overal bed mobility: Needs Assistance Bed Mobility: Supine to Sit     Supine to sit: Min guard     General bed mobility comments: Min guard for safety, increased time to perform  Transfers Overall transfer level: Needs assistance Equipment used: Rolling walker (2 wheeled)   Sit to Stand: Min guard         General transfer comment: Min guard for stability, VCs for hand placement and safety with transfer to standing. Cues for upright posture  Ambulation/Gait Ambulation/Gait assistance: Min guard Ambulation Distance (Feet): 40 Feet Assistive device: Rolling walker (2 wheeled) Gait Pattern/deviations: Step-to pattern;Decreased stride length;Narrow base of support;Trunk flexed Gait velocity: decreased Gait velocity interpretation: Below  normal speed for age/gender General Gait Details: patient with desaturations on 4 liters with activity   Stairs            Wheelchair Mobility    Modified Rankin (Stroke Patients Only)       Balance     Sitting balance-Leahy Scale: Fair     Standing balance support: During functional activity Standing balance-Leahy Scale: Fair                      Cognition Arousal/Alertness: Awake/alert Behavior During Therapy: Flat affect Overall Cognitive Status: No family/caregiver present to determine baseline cognitive functioning Area of Impairment: Following commands;Awareness;Problem solving       Following Commands: Follows one step commands with increased time   Awareness: Intellectual Problem Solving: Slow processing;Decreased initiation;Requires tactile cues;Requires verbal cues;Difficulty sequencing General Comments: patient with some difficulty sequencing and performing functional tasks this session. Slow to process and requires increased verbal and tactile cues for basic tasks such as turning water off at the sink, or to dry hands    Exercises      General Comments        Pertinent Vitals/Pain Pain Assessment: 0-10 Pain Score: 4  Pain Location: 2nd and 3rd fingers  Pain Descriptors / Indicators: Sore Pain Intervention(s): Monitored during session    Home Living                      Prior Function            PT Goals (current goals can now be found in the care plan section) Acute Rehab PT Goals Patient Stated Goal: to go home PT  Goal Formulation: With patient Time For Goal Achievement: 12/07/15 Potential to Achieve Goals: Good Progress towards PT goals: Progressing toward goals    Frequency  Min 3X/week    PT Plan Current plan remains appropriate    Co-evaluation             End of Session Equipment Utilized During Treatment: Oxygen;Gait belt Activity Tolerance: Patient limited by fatigue Patient left: in chair;with  call bell/phone within reach;with chair alarm set     Time: 0045-9977 PT Time Calculation (min) (ACUTE ONLY): 18 min  Charges:  $Therapeutic Activity: 8-22 mins                    G CodesDuncan Livingston 2015-12-04, 2:16 PM Darrell Livingston, Darrell Livingston DPT  [phone]

## 2015-11-24 NOTE — Progress Notes (Addendum)
After speaking with Dr. Verlon Au, pt should not be discharged home until ECCO has been read.  Since ECCO has not been read yet, pt will be staying another night.  Called Hubbard Seldon (son) to inform him.

## 2015-11-24 NOTE — Progress Notes (Signed)
Patient seen for therapy progression OOB to chair, patient at rest on 4 liters sats 86%, once OOB to chair increased to 89% on 4 liters. Patient with some delayed processing this session compared to previous session, full note to follow.  Alben Deeds, [place] DPT  [phone]

## 2015-11-24 NOTE — Discharge Summary (Signed)
Physician Discharge Summary  Darrell Livingston AJO:[id number] DOB: Aug 22, 1920 DOA: 11/21/2015  PCP: Mathews Argyle, MD  Admit date: 11/24/2015 Discharge date: 11/24/2015  Time spent: 35 minutes  Recommendations for Outpatient Follow-up:  1. Needs home O2-Sats 88-90% 2. Lasix 40 bid 3. Needs basic metabolic panel 1 week at PCP office 4. Cc Dr. Tammi Klippel radiation oncology-received SB RT to lungs 4 and S LC 1 month ago and completed treatment-might benefit from chronic low dose steroids given heavy lung disease history, smoking, radiation pneumonitis? 5. Cc Dr. Tamala Julian of cardiology-in the past has been seen by him after intervention and may benefit from revisit from cardiology perspective as outpatient  Discharge Diagnoses:  Principal Problem:   Acute diastolic heart failure ([place]) Active Problems:   COPD (chronic obstructive pulmonary disease) (HCC)   DM type 2 (diabetes mellitus, type 2) (HCC)   Essential hypertension   Non-small cell carcinoma of left lung (HCC)   Moderate malnutrition ([place])   ILD (interstitial lung disease) ([place])   Acute on chronic respiratory failure with hypoxia (HCC)   Chronic venous stasis dermatitis of both lower extremities   Diabetes mellitus with complication (HCC)   Protein-calorie malnutrition, severe   Discharge Condition: Improved  Diet recommendation: Low salt heart healthy as much as tolerated-recognizing that patient is 82 years old  Filed Weights   11/15/2015 1615 11/23/15 0535 11/24/15 0505  Weight: 77.157 kg (170 lb 1.6 oz) 69.99 kg (154 lb 4.8 oz) 74.254 kg (163 lb 88.38 oz)   82 year old ? COPD and diabetes  NSCLC status post XRT~ March 2017 Admitted 4/10 for progressively worsening shortness of breath for the last 1-2 weeks on 4/10 and found to be in acute respiratory failure from suspected CHF.  Patient given one dose of Lasix and admitted to the hospitalist service.  History of present illness:    Acute on chronic respiratory  failure with hypoxia secondary to underlying COPD plus, underlying interstitial lung disease-? Rad pneumonitis,  non-small cell carcinoma of the left lung and acute heart failure  Acute diastolic heart failure (HCC):No previous hx of heart failure, normal echo in 2012.  However, with one dose of Lasix, diuresed 3 lit.  Awaiting repeat echo.  Continue 40 mg twice a day po lasix.  No evidence of acute COPD exacerbation.  Patient wishes to return home      Essential hypertension: Blood pressure stable-cont lisinopril 5 mg od   Non-small cell carcinoma of left lung (HCC)-stable   Moderate malnutrition (HCC) nutrition to see    Chronic venous stasis dermatitis of both lower extremities-patient has no role for nor for any type of other intervention-suggest elevation of the labs above heart while seated. May benefit from compression socks    Diabetes mellitus with complication [facility]): CBG stable, under 87-100   Discharge Exam: Filed Vitals:   11/23/15 2029 11/24/15 0505  BP: 146/88 124/67  Pulse: 93 92  Temp: 98.6 F (37 C) 98 F (36.7 C)  Resp: 18 18    Alert slow to speak pleasant cognitively intact No JVD No bruit Crackles sounding like velvet throughout the lung fields S1-S2 no murmur rub or gallop Abdomen soft nontender    Discharge Instructions    Current Discharge Medication List    CONTINUE these medications which have NOT CHANGED   Details  albuterol (PROVENTIL HFA;VENTOLIN HFA) 108 (90 BASE) MCG/ACT inhaler Inhale 1-2 puffs into the lungs every 6 (six) hours as needed for wheezing or shortness of breath.    albuterol (PROVENTIL) (  2.5 MG/3ML) 0.083% nebulizer solution Take 3 mLs (2.5 mg total) by nebulization every 2 (two) hours as needed for wheezing or shortness of breath. Qty: 75 mL, Refills: 12    atorvastatin (LIPITOR) 20 MG tablet Take 10 mg by mouth daily at 6 PM.     budesonide-formoterol (SYMBICORT) 80-4.5 MCG/ACT inhaler Inhale 2 puffs into the lungs 2  (two) times daily.    feeding supplement, ENSURE ENLIVE, (ENSURE ENLIVE) LIQD Take 237 mLs by mouth 2 (two) times daily between meals. Qty: 237 mL, Refills: 12    guaiFENesin (MUCINEX) 600 MG 12 hr tablet Take 1 tablet (600 mg total) by mouth 2 (two) times daily. Qty: 30 tablet, Refills: 0    latanoprost (XALATAN) 0.005 % ophthalmic solution Place 1 drop into both eyes at bedtime.     tiotropium (SPIRIVA) 18 MCG inhalation capsule Place 18 mcg into inhaler and inhale daily at 12 noon.     triamcinolone (KENALOG) 0.025 % cream Apply 1 application topically daily at 12 noon.     cholecalciferol (VITAMIN D) 1000 UNITS tablet Take 1,000 Units by mouth daily. Reported on 11/16/2015       No Known Allergies    The results of significant diagnostics from this hospitalization (including imaging, microbiology, ancillary and laboratory) are listed below for reference.    Significant Diagnostic Studies: Dg Chest 2 View  11/23/2015  CLINICAL DATA:  Acute diastolic heart failure, history of non-small cell lung malignancy on the left, interstitial lung disease, Celsius OPD, acute on chronic respiratory failure. EXAM: CHEST  2 VIEW COMPARISON:  PA and lateral chest of November 22, 2015 FINDINGS: The lungs remain hyperinflated. The interstitial markings remain increased. Areas of confluent density in the right mid and upper lung and at the left lung base persist. There is no significant pleural effusion. There is no pneumothorax or pneumomediastinum. The heart is top-normal in size. The pulmonary vascularity is not engorged. There are stable post CABG changes. IMPRESSION: Chronic interstitial lung disease with extensive pleuroparenchymal scarring. Overall there has not been significant interval change in the appearance of the chest since yesterday's study. Areas of subtle nodular density bilaterally measured on yesterday's study are not as apparent today. Electronically Signed   By: David  Martinique M.D.   On:  11/23/2015 08:12   Dg Chest 2 View  11/23/2015  CLINICAL DATA:  Productive cough, shortness of breath and chest congestion. COPD. EXAM: CHEST  2 VIEW COMPARISON:  04/17/2015 and 04/09/2015 and PET-CT dated 06/30/2015 and chest CT 04/09/2015 FINDINGS: Extensive areas of scarring and chronic interstitial and obstructive lung disease appears stable. The ill-defined irregular hypermetabolic nodule in the left upper lobe is better demonstrated on the current exam, with is vague area measuring approximately 21 mm in diameter. Tiny bilateral effusions are essentially unchanged. CABG. Accentuation of the thoracic kyphosis with diffuse osteopenia. Aortic atherosclerosis. Old fracture of the anterior aspect of the right third rib with sclerosis. IMPRESSION: Severe chronic interstitial and obstructive lung disease with extensive scarring in both lungs. Hypermetabolic nodule in the left upper lobe is more apparent than on prior exams, compatible with malignancy. Electronically Signed   By: Lorriane Shire M.D.   On: 12/06/2015 13:14   Dg Hand Complete Left  10/27/2015  CLINICAL DATA:  Status post fall 1 day ago. Deformities of the ring and little fingers. Initial encounter. EXAM: LEFT HAND - COMPLETE 3+ VIEW COMPARISON:  None. FINDINGS: The PIP joints of the ring and little fingers are in extreme flexion. No  fracture is identified. Image bones and joints otherwise appear normal. Soft tissues are unremarkable. IMPRESSION: Extreme flexion of the PIP joints of the ring and little fingers of unknown chronicity. Negative for fracture. Electronically Signed   By: Inge Rise M.D.   On: 10/27/2015 15:57    Microbiology: No results found for this or any previous visit (from the past 240 hour(s)).   Labs: Basic Metabolic Panel:  Recent Labs Lab 12/10/2015 1142 11/23/15 0441 11/24/15 0700  NA 139 141 142  K 4.3 3.4* 3.7  CL 97* 92* 94*  CO2 29 39* 39*  GLUCOSE 103* 91 87  BUN '18 15 14  '$ CREATININE 0.95 0.92  0.72  CALCIUM 9.0 8.7* 8.7*   Liver Function Tests: No results for input(s): AST, ALT, ALKPHOS, BILITOT, PROT, ALBUMIN in the last 168 hours. No results for input(s): LIPASE, AMYLASE in the last 168 hours. No results for input(s): AMMONIA in the last 168 hours. CBC:  Recent Labs Lab 11/15/2015 1142 11/24/15 0945  WBC 7.8 8.9  NEUTROABS  --  7.0  HGB 13.8 13.8  HCT 43.3 45.5  MCV 94.5 96.4  PLT 219 213   Cardiac Enzymes: No results for input(s): CKTOTAL, CKMB, CKMBINDEX, TROPONINI in the last 168 hours. BNP: BNP (last 3 results)  Recent Labs  04/09/15 1949 11/13/2015 1227 11/23/15 0400  BNP 436.6* 632.0* 717.3*    ProBNP (last 3 results) No results for input(s): PROBNP in the last 8760 hours.  CBG:  Recent Labs Lab 11/23/15 0603 11/23/15 1126 11/23/15 1623 11/23/15 2056 11/24/15 0618  GLUCAP 91 120* 177* 98 100*       Signed:  Nita Sells MD   [facility] 11/24/2015, 11:15 AM

## 2015-11-25 DIAGNOSIS — I5031 Acute diastolic (congestive) heart failure: Secondary | ICD-10-CM | POA: Diagnosis not present

## 2015-11-25 LAB — GLUCOSE, CAPILLARY: GLUCOSE-CAPILLARY: 186 mg/dL — AB (ref 65–99)

## 2015-11-25 NOTE — Progress Notes (Signed)
After discharge, Nelda Severe requesting that Rxs Lasix '40mg'$  PO BiD, Lisinopril '5mg'$  PO qdaily be called into [facility] pharmacy [address] and [employer]) at [phone].  Received verbal confirmation via Dr. Verlon Au that is ok to do so.  Returned call to [employer] notifying him that Rxs had been called in.

## 2015-11-25 NOTE — Progress Notes (Signed)
Assessing pt's O2 saturation, and pt was unable to stand r/t weakness.  Pt is on O2 5L and with minimal movement, desatted to 83%.  MD notified.

## 2015-11-25 NOTE — Progress Notes (Signed)
SATURATION QUALIFICATIONS: (This note is used to comply with regulatory documentation for home oxygen)  Patient Saturations on Room Air at Rest =86%   

## 2015-11-25 NOTE — Discharge Summary (Signed)
Physician Discharge Summary  Darrell Livingston OIN:[id number] DOB: 03-12-1921 DOA: 12/08/2015  PCP: Mathews Argyle, MD  Admit date: 11/14/2015 Discharge date: 11/25/2015  Time spent: 35 minutes  Recommendations for Outpatient Follow-up:  1. Needs home O2-Sats 88-90% 2. Lasix 40 bid 3. Needs basic metabolic panel 1 week at PCP office 4. Cc Dr. Tammi Klippel radiation oncology-received SB RT to lungs 4 and S LC 1 month ago and completed treatment-might benefit from chronic low dose steroids given heavy lung disease history, smoking, radiation pneumonitis? 5. Cc Dr. Tamala Julian of cardiology-in the past has been seen by him after intervention and may benefit from revisit from cardiology perspective as outpatient  Discharge Diagnoses:  Principal Problem:   Acute diastolic heart failure ([place]) Active Problems:   COPD (chronic obstructive pulmonary disease) (HCC)   DM type 2 (diabetes mellitus, type 2) (HCC)   Essential hypertension   Non-small cell carcinoma of left lung (HCC)   Moderate malnutrition ([place])   ILD (interstitial lung disease) ([place])   Acute on chronic respiratory failure with hypoxia (HCC)   Chronic venous stasis dermatitis of both lower extremities   Diabetes mellitus with complication (HCC)   Protein-calorie malnutrition, severe   Discharge Condition: Improved  Diet recommendation: Low salt heart healthy as much as tolerated-recognizing that patient is 82 years old  Filed Weights   11/23/15 0535 11/24/15 0505 11/25/15 0525  Weight: 69.99 kg (154 lb 4.8 oz) 74.254 kg (163 lb 11.2 oz) 73.165 kg (161 lb 18.10 oz)   82 year old ? COPD and diabetes  NSCLC status post XRT~ March 2017 Admitted 4/10 for progressively worsening shortness of breath for the last 1-2 weeks on 4/10 and found to be in acute respiratory failure from suspected CHF.  Patient given one dose of Lasix and admitted to the hospitalist service.  History of present illness:    Acute on chronic respiratory  failure with hypoxia secondary to underlying COPD plus, underlying interstitial lung disease-? Rad pneumonitis,  non-small cell carcinoma of the left lung and acute heart failure  Acute diastolic heart failure (HCC):No previous hx of heart failure, normal echo in 2012.  However, with one dose of Lasix, diuresed 3 lit.    Continue 40 mg twice a day po lasix.  No evidence of acute COPD exacerbation.  Patient wishes to return home   ECHO this admit EF60-65% with no WM anomalies-need sclose f/u Dr. Tamala Julian ~ 1 week transitional visit    Essential hypertension: Blood pressure stable-cont lisinopril 5 mg od   Non-small cell carcinoma of left lung (HCC)-stable   Moderate malnutrition (HCC) nutrition to see    Chronic venous stasis dermatitis of both lower extremities-patient has no role for nor for any type of other intervention-suggest elevation of the labs above heart while seated. May benefit from compression socks    Diabetes mellitus with complication ([place]): CBG stable, under 87-100   Discharge Exam: Filed Vitals:   11/24/15 2050 11/25/15 0525  BP: 135/88 158/92  Pulse:  114  Temp: 97.9 F (36.6 C) 98.7 F (37.1 C)  Resp:      Alert slow to speak pleasant cognitively intact No JVD No bruit Crackles sounding like velvet throughout the lung fields S1-S2 no murmur rub or gallop Abdomen soft nontender    Discharge Instructions   Discharge Instructions    Diet - low sodium heart healthy    Complete by:  As directed      Discharge instructions    Complete by:  As directed  We will order home oxygen and patient should keep his oxygen saturations between 88 and 91% only not about this. Patient would benefit from keeping his legs above his heart level to prevent swelling of the lower extremities Please consider low-salt diet as much as patient will tolerate-we may need to allow more salt in the diet to allow for flavor and nutrition Please follow-up with Dr. Tammi Klippel as scheduled and  if not schedule within the next month, would recommend that patient be seen by him in 3 weeks Please reestablish care with cardiology Dr. Pernell Dupre for further management of heart failure as an outpatient If he gains more than 2 pounds in a 24-hour period of time, would benefit from an extra dose of Lasix 40 mg Please follow-up with Dr. Felipa Eth to get lab work done within a week to check on his kidney function     Increase activity slowly    Complete by:  As directed           Current Discharge Medication List    CONTINUE these medications which have NOT CHANGED   Details  albuterol (PROVENTIL HFA;VENTOLIN HFA) 108 (90 BASE) MCG/ACT inhaler Inhale 1-2 puffs into the lungs every 6 (six) hours as needed for wheezing or shortness of breath.    albuterol (PROVENTIL) (2.5 MG/3ML) 0.083% nebulizer solution Take 3 mLs (2.5 mg total) by nebulization every 2 (two) hours as needed for wheezing or shortness of breath. Qty: 75 mL, Refills: 12    atorvastatin (LIPITOR) 20 MG tablet Take 10 mg by mouth daily at 6 PM.     budesonide-formoterol (SYMBICORT) 80-4.5 MCG/ACT inhaler Inhale 2 puffs into the lungs 2 (two) times daily.    feeding supplement, ENSURE ENLIVE, (ENSURE ENLIVE) LIQD Take 237 mLs by mouth 2 (two) times daily between meals. Qty: 237 mL, Refills: 12    guaiFENesin (MUCINEX) 600 MG 12 hr tablet Take 1 tablet (600 mg total) by mouth 2 (two) times daily. Qty: 30 tablet, Refills: 0    latanoprost (XALATAN) 0.005 % ophthalmic solution Place 1 drop into both eyes at bedtime.     tiotropium (SPIRIVA) 18 MCG inhalation capsule Place 18 mcg into inhaler and inhale daily at 12 noon.     triamcinolone (KENALOG) 0.025 % cream Apply 1 application topically daily at 12 noon.     cholecalciferol (VITAMIN D) 1000 UNITS tablet Take 1,000 Units by mouth daily. Reported on 11/19/2015       No Known Allergies Follow-up Information    Follow up with Sinclair Grooms, MD. Schedule an  appointment as soon as possible for a visit in 2 weeks.   Specialty:  Cardiology   Contact information:   [address] [address] [phone]       Follow up with Tyler Pita, MD In 3 weeks.   Specialty:  Radiation Oncology   Contact information:   [address] [place] [phone]       Follow up with [facility].   Why:  They will continue to do your home health care at your home   Contact information:   [address] [place] [address] [phone]        The results of significant diagnostics from this hospitalization (including imaging, microbiology, ancillary and laboratory) are listed below for reference.    Significant Diagnostic Studies: Dg Chest 2 View  11/23/2015  CLINICAL DATA:  Acute diastolic heart failure, history of non-small  cell lung malignancy on the left, interstitial lung disease, Celsius OPD, acute on chronic respiratory failure. EXAM: CHEST  2 VIEW COMPARISON:  PA and lateral chest of November 22, 2015 FINDINGS: The lungs remain hyperinflated. The interstitial markings remain increased. Areas of confluent density in the right mid and upper lung and at the left lung base persist. There is no significant pleural effusion. There is no pneumothorax or pneumomediastinum. The heart is top-normal in size. The pulmonary vascularity is not engorged. There are stable post CABG changes. IMPRESSION: Chronic interstitial lung disease with extensive pleuroparenchymal scarring. Overall there has not been significant interval change in the appearance of the chest since yesterday's study. Areas of subtle nodular density bilaterally measured on yesterday's study are not as apparent today. Electronically Signed   By: David  Martinique M.D.   On: 11/23/2015 08:12   Dg Chest 2 View  11/14/2015  CLINICAL DATA:  Productive cough, shortness of breath and chest congestion. COPD. EXAM: CHEST  2 VIEW COMPARISON:   04/17/2015 and 04/09/2015 and PET-CT dated 06/30/2015 and chest CT 04/09/2015 FINDINGS: Extensive areas of scarring and chronic interstitial and obstructive lung disease appears stable. The ill-defined irregular hypermetabolic nodule in the left upper lobe is better demonstrated on the current exam, with is vague area measuring approximately 21 mm in diameter. Tiny bilateral effusions are essentially unchanged. CABG. Accentuation of the thoracic kyphosis with diffuse osteopenia. Aortic atherosclerosis. Old fracture of the anterior aspect of the right third rib with sclerosis. IMPRESSION: Severe chronic interstitial and obstructive lung disease with extensive scarring in both lungs. Hypermetabolic nodule in the left upper lobe is more apparent than on prior exams, compatible with malignancy. Electronically Signed   By: Lorriane Shire M.D.   On: 12/01/2015 13:14   Dg Hand Complete Left  10/27/2015  CLINICAL DATA:  Status post fall 1 day ago. Deformities of the ring and little fingers. Initial encounter. EXAM: LEFT HAND - COMPLETE 3+ VIEW COMPARISON:  None. FINDINGS: The PIP joints of the ring and little fingers are in extreme flexion. No fracture is identified. Image bones and joints otherwise appear normal. Soft tissues are unremarkable. IMPRESSION: Extreme flexion of the PIP joints of the ring and little fingers of unknown chronicity. Negative for fracture. Electronically Signed   By: Inge Rise M.D.   On: 10/27/2015 15:57    Microbiology: No results found for this or any previous visit (from the past 240 hour(s)).   Labs: Basic Metabolic Panel:  Recent Labs Lab 11/24/2015 1142 11/23/15 0441 11/24/15 0700 11/24/15 0945  NA 139 141 142 143  K 4.3 3.4* 3.7 4.1  CL 97* 92* 94* 92*  CO2 29 39* 39* 42*  GLUCOSE 103* 91 87 110*  BUN '18 15 14 14  '$ CREATININE 0.95 0.92 0.72 0.85  CALCIUM 9.0 8.7* 8.7* 9.0   Liver Function Tests:  Recent Labs Lab 11/24/15 0945  AST 29  ALT 22  ALKPHOS 80    BILITOT 0.9  PROT 6.9  ALBUMIN 2.6*   No results for input(s): LIPASE, AMYLASE in the last 168 hours. No results for input(s): AMMONIA in the last 168 hours. CBC:  Recent Labs Lab 12/01/2015 1142 11/24/15 0945  WBC 7.8 8.9  NEUTROABS  --  7.0  HGB 13.8 13.8  HCT 43.3 45.5  MCV 94.5 96.4  PLT 219 213   Cardiac Enzymes: No results for input(s): CKTOTAL, CKMB, CKMBINDEX, TROPONINI in the last 168 hours. BNP: BNP (last 3 results)  Recent Labs  04/09/15 1949  12/03/2015 1227 11/23/15 0400  BNP 436.6* 632.0* 717.3*    ProBNP (last 3 results) No results for input(s): PROBNP in the last 8760 hours.  CBG:  Recent Labs Lab 11/23/15 2056 11/24/15 0618 11/24/15 1135 11/24/15 1825 11/24/15 2145  GLUCAP 98 100* 176* 98 97       Signed:  Nita Sells MD   [facility] 11/25/2015, 7:46 AM

## 2015-11-25 NOTE — Care Management Note (Addendum)
Case Management Note  Patient Details  Name: Darrell Livingston MRN: [id number] Date of Birth: 20-Sep-1920  Subjective/Objective:      Admitted with CHF             Action/Plan: Patient lives at home alone, Son Darrell Livingston is very supportive; CM received permission to talk to his son Darrell Livingston; Patient is active with [facility] for [facility] /PT and has additional support at home with [employer]. No needs identified   at this time. Orders placed to resume services with [facility] at discharge Expected Discharge Date:     Possibly 11/24/2015             Expected Discharge Plan:  [place]  Discharge planning Services  CM Consult Choice offered to:  Patient, Adult Children   DME Arranged:  Oxygen DME Agency:  [facility]  HH Arranged:  RN, PT [place] Agency:  [place]  Status of Service:  Complete, will sign off  Karlyne Greenspan [phone] 11/25/2015, 3:07 PM Pt states he already has rolling walker at home.  Pt will discharge home with his son.  Pt qualifies for home O2, order written, referral given.  Portable tank will be delivered to pts room prior to discharge

## 2015-11-27 ENCOUNTER — Emergency Department (HOSPITAL_COMMUNITY): Payer: Medicare Other

## 2015-11-27 ENCOUNTER — Encounter (HOSPITAL_COMMUNITY): Payer: Self-pay

## 2015-11-27 ENCOUNTER — Emergency Department (HOSPITAL_COMMUNITY)
Admission: EM | Admit: 2015-11-27 | Discharge: 2015-12-13 | Disposition: E | Payer: Medicare Other | Source: Home / Self Care | Attending: Family Medicine | Admitting: Family Medicine

## 2015-11-27 DIAGNOSIS — J9621 Acute and chronic respiratory failure with hypoxia: Secondary | ICD-10-CM | POA: Diagnosis present

## 2015-11-27 DIAGNOSIS — I10 Essential (primary) hypertension: Secondary | ICD-10-CM | POA: Diagnosis present

## 2015-11-27 DIAGNOSIS — J9620 Acute and chronic respiratory failure, unspecified whether with hypoxia or hypercapnia: Secondary | ICD-10-CM | POA: Insufficient documentation

## 2015-11-27 DIAGNOSIS — E119 Type 2 diabetes mellitus without complications: Secondary | ICD-10-CM

## 2015-11-27 DIAGNOSIS — J441 Chronic obstructive pulmonary disease with (acute) exacerbation: Secondary | ICD-10-CM | POA: Diagnosis present

## 2015-11-27 DIAGNOSIS — J9600 Acute respiratory failure, unspecified whether with hypoxia or hypercapnia: Secondary | ICD-10-CM | POA: Insufficient documentation

## 2015-11-27 LAB — CBC WITH DIFFERENTIAL/PLATELET
Basophils Absolute: 0 10*3/uL (ref 0.0–0.1)
Basophils Relative: 0 %
Eosinophils Absolute: 0 10*3/uL (ref 0.0–0.7)
Eosinophils Relative: 0 %
HEMATOCRIT: 50.7 % (ref 39.0–52.0)
HEMOGLOBIN: 14.7 g/dL (ref 13.0–17.0)
LYMPHS ABS: 1 10*3/uL (ref 0.7–4.0)
LYMPHS PCT: 9 %
MCH: 29.6 pg (ref 26.0–34.0)
MCHC: 29 g/dL — AB (ref 30.0–36.0)
MCV: 102.2 fL — AB (ref 78.0–100.0)
MONO ABS: 0.9 10*3/uL (ref 0.1–1.0)
MONOS PCT: 9 %
NEUTROS ABS: 9.1 10*3/uL — AB (ref 1.7–7.7)
Neutrophils Relative %: 82 %
Platelets: 216 10*3/uL (ref 150–400)
RBC: 4.96 MIL/uL (ref 4.22–5.81)
RDW: 19.9 % — AB (ref 11.5–15.5)
WBC: 11 10*3/uL — ABNORMAL HIGH (ref 4.0–10.5)

## 2015-11-27 LAB — BRAIN NATRIURETIC PEPTIDE: B Natriuretic Peptide: 545.2 pg/mL — ABNORMAL HIGH (ref 0.0–100.0)

## 2015-11-27 LAB — I-STAT TROPONIN, ED: Troponin i, poc: 0.07 ng/mL (ref 0.00–0.08)

## 2015-11-27 LAB — POTASSIUM: Potassium: 4.8 mmol/L (ref 3.5–5.1)

## 2015-11-27 MED ORDER — IPRATROPIUM-ALBUTEROL 0.5-2.5 (3) MG/3ML IN SOLN: 3.0000 mL | Freq: Four times a day (QID) | RESPIRATORY_TRACT | Status: DC

## 2015-11-27 MED ORDER — SODIUM CHLORIDE 0.9 % IV SOLN: INTRAVENOUS | Status: DC

## 2015-11-27 MED ORDER — ONDANSETRON HCL 4 MG PO TABS: 4.0000 mg | ORAL_TABLET | Freq: Four times a day (QID) | ORAL | Status: DC | PRN

## 2015-11-27 MED ORDER — ACETAMINOPHEN 325 MG PO TABS: 650.0000 mg | ORAL_TABLET | Freq: Four times a day (QID) | ORAL | Status: DC | PRN

## 2015-11-27 MED ORDER — MORPHINE SULFATE (PF) 4 MG/ML IV SOLN
4.0000 mg | INTRAVENOUS | Status: AC
  Administered 2015-11-27: 4 mg via INTRAVENOUS
  Filled 2015-11-27: qty 1

## 2015-11-27 MED ORDER — DIPHENHYDRAMINE HCL 50 MG/ML IJ SOLN: 12.5000 mg | INTRAMUSCULAR | Status: DC | PRN

## 2015-11-27 MED ORDER — ATROPINE SULFATE 1 % OP SOLN: 4.0000 [drp] | OPHTHALMIC | Status: DC | PRN

## 2015-11-27 MED ORDER — LATANOPROST 0.005 % OP SOLN: 1.0000 [drp] | Freq: Every day | OPHTHALMIC | Status: DC

## 2015-11-27 MED ORDER — METHYLPREDNISOLONE SODIUM SUCC 125 MG IJ SOLR: 60.0000 mg | Freq: Four times a day (QID) | INTRAMUSCULAR | Status: DC

## 2015-11-27 MED ORDER — MORPHINE SULFATE (PF) 2 MG/ML IV SOLN: 1.0000 mg | INTRAVENOUS | Status: DC | PRN

## 2015-11-27 MED ORDER — ACETAMINOPHEN 650 MG RE SUPP: 650.0000 mg | Freq: Four times a day (QID) | RECTAL | Status: DC | PRN

## 2015-11-27 MED ORDER — LORAZEPAM 1 MG PO TABS: 1.0000 mg | ORAL_TABLET | ORAL | Status: DC | PRN

## 2015-11-27 MED ORDER — NITROGLYCERIN 2 % TD OINT
1.0000 [in_us] | TOPICAL_OINTMENT | Freq: Once | TRANSDERMAL | Status: AC
  Administered 2015-11-27: 1 [in_us] via TOPICAL
  Filled 2015-11-27: qty 1

## 2015-11-27 MED ORDER — ENOXAPARIN SODIUM 40 MG/0.4ML ~~LOC~~ SOLN: 40.0000 mg | SUBCUTANEOUS | Status: DC

## 2015-11-27 MED ORDER — LORAZEPAM 2 MG/ML PO CONC: 1.0000 mg | ORAL | Status: DC | PRN

## 2015-11-27 MED ORDER — INSULIN ASPART 100 UNIT/ML ~~LOC~~ SOLN: 0.0000 [IU] | Freq: Every day | SUBCUTANEOUS | Status: DC

## 2015-11-27 MED ORDER — ALBUTEROL SULFATE (2.5 MG/3ML) 0.083% IN NEBU: 2.5000 mg | INHALATION_SOLUTION | RESPIRATORY_TRACT | Status: DC

## 2015-11-27 MED ORDER — FUROSEMIDE 10 MG/ML IJ SOLN
60.0000 mg | Freq: Once | INTRAMUSCULAR | Status: AC
  Administered 2015-11-27: 60 mg via INTRAVENOUS
  Filled 2015-11-27: qty 6

## 2015-11-27 MED ORDER — HALOPERIDOL 1 MG PO TABS: 0.5000 mg | ORAL_TABLET | ORAL | Status: DC | PRN

## 2015-11-27 MED ORDER — HALOPERIDOL LACTATE 5 MG/ML IJ SOLN: 0.5000 mg | INTRAMUSCULAR | Status: DC | PRN

## 2015-11-27 MED ORDER — HALOPERIDOL LACTATE 2 MG/ML PO CONC: 0.5000 mg | ORAL | Status: DC | PRN

## 2015-11-27 MED ORDER — LORAZEPAM 2 MG/ML IJ SOLN
INTRAMUSCULAR | Status: AC
  Filled 2015-11-27: qty 1

## 2015-11-27 MED ORDER — ONDANSETRON HCL 4 MG/2ML IJ SOLN
4.0000 mg | Freq: Four times a day (QID) | INTRAMUSCULAR | Status: DC | PRN
  Administered 2015-11-27: 4 mg via INTRAVENOUS
  Filled 2015-11-27: qty 2

## 2015-11-27 MED ORDER — LORAZEPAM 2 MG/ML IJ SOLN
1.0000 mg | INTRAMUSCULAR | Status: DC | PRN
  Administered 2015-11-27: 1 mg via INTRAVENOUS

## 2015-11-27 MED ORDER — INSULIN ASPART 100 UNIT/ML ~~LOC~~ SOLN: 0.0000 [IU] | Freq: Three times a day (TID) | SUBCUTANEOUS | Status: DC

## 2015-12-01 ENCOUNTER — Ambulatory Visit (HOSPITAL_COMMUNITY): Payer: Medicare Other

## 2015-12-02 ENCOUNTER — Ambulatory Visit: Payer: Medicare Other | Admitting: Radiation Oncology

## 2015-12-09 ENCOUNTER — Ambulatory Visit: Payer: Medicare Other | Admitting: Physician Assistant

## 2015-12-13 DIAGNOSIS — 419620001 Death: Secondary | SNOMED CT | POA: Diagnosis not present

## 2015-12-13 NOTE — ED Notes (Signed)
Pt taken off bipap, family at bedside

## 2015-12-13 NOTE — ED Notes (Signed)
Pt presents to er via ems with complaints of shortness of breath, the patient was just discharged from the hospital last week and diagnosed with chf and sent home with Lasix 40 mg, family found him this morning increasingly short of breath, on ems arrival he was having difficulty breathing with wheezes and received 2 breathing treatments and 125 mg of solumedrol IV. Denies any pain, patient is at his baseline neurologically per family

## 2015-12-13 NOTE — ED Notes (Signed)
Family remains at bedside.

## 2015-12-13 NOTE — ED Notes (Addendum)
Admitting Nurse Practioner at bedside talking with patients family about comfort care due to the patient not responding well to BiPAP, patient is now being taken off bipap

## 2015-12-13 NOTE — ED Notes (Signed)
Pt is now in asystole on the monitor Dr. Aggie Moats at bedside along with family, TOD 1725.

## 2015-12-13 NOTE — ED Provider Notes (Signed)
CSN: [id number]     Arrival date & time 12/07/15  1131 History   First MD Initiated Contact with Patient 12-07-2015 1140     Chief Complaint  Patient presents with  . Shortness of Breath   Level V caveat respiratory distress. Patient less responsive. History is obtained from paramedics and from patient's sons and from old hospital charts  (Consider location/radiation/quality/duration/timing/severity/associated sxs/prior Treatment) HPI Complains of worsening shortness of breath since returning home from hospital 2 days ago where he was treated for congestive heart failure. His son's report that he is become progressively more short of breath and less responsive. EMS treated patient with Solu-Medrol 125 g IV, and 2 nebulized treatments. Past Medical History  Diagnosis Date  . COPD (chronic obstructive pulmonary disease) ([place])   . Lung cancer ([place])   . Cardiac disorder   . Coronary artery disease   . Hypertension   . Diabetes mellitus   . COPD (chronic obstructive pulmonary disease) ([place]) 07/20/2011  . DM type 2 (diabetes mellitus, type 2) ([place]) 07/20/2011  . Hypertension 07/20/2011   congestive heart failure Past Surgical History  Procedure Laterality Date  . Cardiac surgery    . Cholecystectomy    . Coronary angioplasty with stent placement    . Eye surgery     Family History  Problem Relation Age of Onset  . Cancer Neg Hx    Social History  Substance Use Topics  . Smoking status: Former Smoker -- 2.00 packs/day for 54 years    Types: Cigarettes    Quit date: 11/17/1996  . Smokeless tobacco: Never Used  . Alcohol Use: Yes     Comment: up to 2 drinks per day but not everyday per pt    Review of Systems  Unable to perform ROS: Mental status change  Respiratory: Positive for shortness of breath.       Allergies  Review of patient's allergies indicates no known allergies.  Home Medications   Prior to Admission medications   Medication Sig Start Date End Date Taking?  Authorizing Provider  albuterol (PROVENTIL HFA;VENTOLIN HFA) 108 (90 BASE) MCG/ACT inhaler Inhale 1-2 puffs into the lungs every 6 (six) hours as needed for wheezing or shortness of breath.    Historical Provider, MD  albuterol (PROVENTIL) (2.5 MG/3ML) 0.083% nebulizer solution Take 3 mLs (2.5 mg total) by nebulization every 2 (two) hours as needed for wheezing or shortness of breath. 04/20/15   Allie Bossier, MD  atorvastatin (LIPITOR) 20 MG tablet Take 10 mg by mouth daily at 6 PM.     Historical Provider, MD  budesonide-formoterol (SYMBICORT) 80-4.5 MCG/ACT inhaler Inhale 2 puffs into the lungs 2 (two) times daily.    Historical Provider, MD  cholecalciferol (VITAMIN D) 1000 UNITS tablet Take 1,000 Units by mouth daily. Reported on 12/07/2015    Historical Provider, MD  feeding supplement, ENSURE ENLIVE, (ENSURE ENLIVE) LIQD Take 237 mLs by mouth 2 (two) times daily between meals. 04/20/15   Allie Bossier, MD  guaiFENesin (MUCINEX) 600 MG 12 hr tablet Take 1 tablet (600 mg total) by mouth 2 (two) times daily. 04/20/15   Allie Bossier, MD  latanoprost (XALATAN) 0.005 % ophthalmic solution Place 1 drop into both eyes at bedtime.     Historical Provider, MD  tiotropium (SPIRIVA) 18 MCG inhalation capsule Place 18 mcg into inhaler and inhale daily at 12 noon.     Historical Provider, MD  triamcinolone (KENALOG) 0.025 % cream Apply 1 application topically daily at  12 noon.     Historical Provider, MD   BP 140/87 mmHg  Pulse 126  Temp(Src) 98.5 F (36.9 C) (Rectal)  Resp 22  Ht '5\' 8"'$  (1.727 m)  Wt 170 lb (77.111 kg)  BMI 25.85 kg/m2  SpO2 96% Physical Exam  Constitutional:  Chronically and acutely ill-appearing. Opens eyes to tactile stimulus. Does not follow simple commands  HENT:  Head: Normocephalic and atraumatic.  Eyes: Conjunctivae are normal. Pupils are equal, round, and reactive to light.  Neck: Neck supple. JVD present. No tracheal deviation present. No thyromegaly present.   Cardiovascular: Regular rhythm.   No murmur heard. Tachycardic  Pulmonary/Chest: Effort normal and breath sounds normal.  Rales all the way up bilaterally  Abdominal: Soft. Bowel sounds are normal. He exhibits no distension. There is no tenderness.  Musculoskeletal: Normal range of motion. He exhibits edema. He exhibits no tenderness.  1+ pretibial edema bilaterally  Skin: Skin is warm and dry. Rash noted.  Erythematous changes of both lower legs bilaterally below the knees  Psychiatric: He has a normal mood and affect.  Nursing note and vitals reviewed.   ED Course  Procedures (including critical care time) Labs Review Labs Reviewed  CBC WITH DIFFERENTIAL/PLATELET  BRAIN NATRIURETIC PEPTIDE  I-STAT CHEM 8, ED  I-STAT TROPOININ, ED    Imaging Review No results found. I have personally reviewed and evaluated these images and lab results as part of my medical decision-making.   EKG Interpretation   Date/Time:  2015-11-28 11:32:32 EDT Ventricular Rate:  123 PR Interval:  126 QRS Duration: 148 QT Interval:  396 QTC Calculation: 566 R Axis:   8 Text Interpretation:  Sinus tachycardia Right bundle branch block Baseline  wander in lead(s) V1 SINCE LAST TRACING HEART RATE HAS INCREASED Confirmed  by Winfred Leeds  MD, Aleesha Ringstad [phone]) on 28-Nov-2015 12:08:42 PM     DO NOT RESUSCITATE CODE STATUS was established upon patient's arrival with his sons and power of attorney. Palliative care consult called. I spoke with Dr. Domingo Cocking from palliative care who will evaluate patient went in the hospital. Placed on BiPAP   3: 35 PM exam is essentially unchanged. Remains tachycardic. chestx-ray viewed by me. Results for orders placed or performed during the hospital encounter of 2015/11/28  CBC with Differential/Platelet  Result Value Ref Range   WBC 11.0 (H) 4.0 - 10.5 K/uL   RBC 4.96 4.22 - 5.81 MIL/uL   Hemoglobin 14.7 13.0 - 17.0 g/dL   HCT 50.7 39.0 - 52.0 %   MCV 102.2 (H)  78.0 - 100.0 fL   MCH 29.6 26.0 - 34.0 pg   MCHC 29.0 (L) 30.0 - 36.0 g/dL   RDW 19.9 (H) 11.5 - 15.5 %   Platelets 216 150 - 400 K/uL   Neutrophils Relative % 82 %   Neutro Abs 9.1 (H) 1.7 - 7.7 K/uL   Lymphocytes Relative 9 %   Lymphs Abs 1.0 0.7 - 4.0 K/uL   Monocytes Relative 9 %   Monocytes Absolute 0.9 0.1 - 1.0 K/uL   Eosinophils Relative 0 %   Eosinophils Absolute 0.0 0.0 - 0.7 K/uL   Basophils Relative 0 %   Basophils Absolute 0.0 0.0 - 0.1 K/uL  Brain natriuretic peptide  Result Value Ref Range   B Natriuretic Peptide 545.2 (H) 0.0 - 100.0 pg/mL  Potassium  Result Value Ref Range   Potassium 4.8 3.5 - 5.1 mmol/L  I-stat troponin, ED  Result Value Ref Range   Troponin  i, poc 0.07 0.00 - 0.08 ng/mL   Comment 3           i-STAT 8 remarkable for potassium 6.6, BUN 64 otherwise normal consistent with renal insufficiency and hyperkalemia. Hyperkalemia felt possibly to be factitious. Repeat serum potassium 4.8, normal Dg Chest 2 View  11/23/2015  CLINICAL DATA:  Acute diastolic heart failure, history of non-small cell lung malignancy on the left, interstitial lung disease, Celsius OPD, acute on chronic respiratory failure. EXAM: CHEST  2 VIEW COMPARISON:  PA and lateral chest of November 22, 2015 FINDINGS: The lungs remain hyperinflated. The interstitial markings remain increased. Areas of confluent density in the right mid and upper lung and at the left lung base persist. There is no significant pleural effusion. There is no pneumothorax or pneumomediastinum. The heart is top-normal in size. The pulmonary vascularity is not engorged. There are stable post CABG changes. IMPRESSION: Chronic interstitial lung disease with extensive pleuroparenchymal scarring. Overall there has not been significant interval change in the appearance of the chest since yesterday's study. Areas of subtle nodular density bilaterally measured on yesterday's study are not as apparent today. Electronically Signed    By: David  Martinique M.D.   On: 11/23/2015 08:12   Dg Chest 2 View  11/21/2015  CLINICAL DATA:  Productive cough, shortness of breath and chest congestion. COPD. EXAM: CHEST  2 VIEW COMPARISON:  04/17/2015 and 04/09/2015 and PET-CT dated 06/30/2015 and chest CT 04/09/2015 FINDINGS: Extensive areas of scarring and chronic interstitial and obstructive lung disease appears stable. The ill-defined irregular hypermetabolic nodule in the left upper lobe is better demonstrated on the current exam, with is vague area measuring approximately 21 mm in diameter. Tiny bilateral effusions are essentially unchanged. CABG. Accentuation of the thoracic kyphosis with diffuse osteopenia. Aortic atherosclerosis. Old fracture of the anterior aspect of the right third rib with sclerosis. IMPRESSION: Severe chronic interstitial and obstructive lung disease with extensive scarring in both lungs. Hypermetabolic nodule in the left upper lobe is more apparent than on prior exams, compatible with malignancy. Electronically Signed   By: Lorriane Shire M.D.   On: 11/26/2015 13:14   Dg Chest Port 1 View  12/01/2015  CLINICAL DATA:  82 year old male with shortness of breath and decreased responsiveness for the past day EXAM: PORTABLE CHEST 1 VIEW COMPARISON:  Prior chest x-ray 11/23/2015 FINDINGS: Patient is status post median sternotomy with evidence of prior CABG. Stable cardiac and mediastinal contours. Atherosclerotic calcifications again noted in the aorta. Nonspecific bibasilar opacities. Otherwise, improved aeration compared to 11/23/2015. No pneumothorax, pulmonary edema. No acute osseous abnormality. IMPRESSION: Nonspecific patchy bibasilar opacities may reflect atelectasis, small volume aspiration, or less likely multifocal pneumonia. Otherwise, similar appearance of chronic pleural parenchymal changes. No evidence of pulmonary edema. Electronically Signed   By: Jacqulynn Cadet M.D.   On: 2015/12/01 13:23    MDM  Hospitalist  service consulted [place]. Spoke with Ms. Renard Hamper, nurse practitioner who will evaluate patient in hospital Plan admit step down unit Diagnosis acute respiratory failure #2 renal insufficiency Final diagnoses:  None  CRITICAL CARE Performed by: Orlie Dakin Total critical care time: 35 minutes Critical care time was exclusive of separately billable procedures and treating other patients. Critical care was necessary to treat or prevent imminent or life-threatening deterioration. Critical care was time spent personally by me on the following activities: development of treatment plan with patient and/or surrogate as well as nursing, discussions with consultants, evaluation of patient's response to treatment, examination of patient, obtaining history from  patient or surrogate, ordering and performing treatments and interventions, ordering and review of laboratory studies, ordering and review of radiographic studies, pulse oximetry and re-evaluation of patient's condition.      Orlie Dakin, MD 2015/12/10 1556

## 2015-12-13 NOTE — ED Notes (Signed)
Pts breathing is labored and family request something to make him comfortable

## 2015-12-13 NOTE — Consult Note (Signed)
Death Summary  Darrell Livingston:[id number] DOB: 03/21/1921 DOA: 2015/12/06  PCP: Mathews Argyle, MD PCP/Office notified:   Admit date: 2015/12/06 Date of Death: 12-06-15  Final Diagnoses:  Principal Problem:   Acute on chronic respiratory failure with hypoxia ([place]) Active Problems:   DM type 2 (diabetes mellitus, type 2) ([place])   COPD with acute exacerbation ([place])   Essential hypertension   Acute respiratory failure ([place])     History of present illness:  82 year old with a past medical history that includes COPD, CHF, diabetes lung cancer, CAD, hypertension presents to the emergency department chief complaint of shortness of breath and altered mental status. Evaluation reveals acute on chronic respiratory failure likely related to COPD exacerbation.  Information is obtained from the sons are at the bedside. States the patient discharged 2 days ago after hospitalization for CHF exacerbation. He went home on oxygen was slightly short of breath which worsened over the last 2 days. This morning patient was unresponsive lying in bed with head back mouth open eyes open but not following commands. Son reports "he was struggling to breathe". EMS was called and he was placed on BiPAP given nebulizers and Solu-Medrol. Transported to emergency department he was provided with IV Lasix  In the emergency department patient remained unresponsive on BiPAP tachycardic afebrile table blood pressure.   Hospital Course:    1. Acute on chronic respiratory failure with hypoxia. Likely multifactorial specifically CHF exacerbation COPD exacerbation. Chest x-ray nonspecific patchy bibasilar opacities may reflect atelectasis, small volume aspiration, or less likely multifocal pneumoni. No leukocytosis afebrile stable blood pressure, BNP 545. Patient continued with respiratory distress in spite of IV Lasix BiPAP Solu-Medrol. Discussed with family palliative care and hospice care. Family requesting  comfort care for patient now. Discussed with Dr. Domingo Cocking of palliative care -End of life orders -Discontinued BiPAP 4:45pm -morphine for comfort -Time of death 5:25pm     Time: 60 minutes  Signed:  Radene Gunning  [facility] December 06, 2015, 5:37 PM

## 2015-12-13 DEATH — deceased

## 2015-12-30 ENCOUNTER — Ambulatory Visit: Payer: Self-pay | Admitting: Radiation Oncology

## 2016-05-31 ENCOUNTER — Ambulatory Visit (HOSPITAL_COMMUNITY): Payer: Medicare Other

## 2016-06-01 ENCOUNTER — Ambulatory Visit: Payer: Self-pay | Admitting: Radiation Oncology

## 2016-11-25 IMAGING — CR DG CHEST 2V
2 series · 2 of 2 positions shown · non-contrast
Comparison: Chest x-ray dated 07/21/2011. Comparison also made to
outside chest CT dated 03/01/2015.

CLINICAL DATA: History of lung cancer, recent increase in shortness
of breath and decreased O2 sats.

EXAM:
CHEST  2 VIEW

[w chest pa]
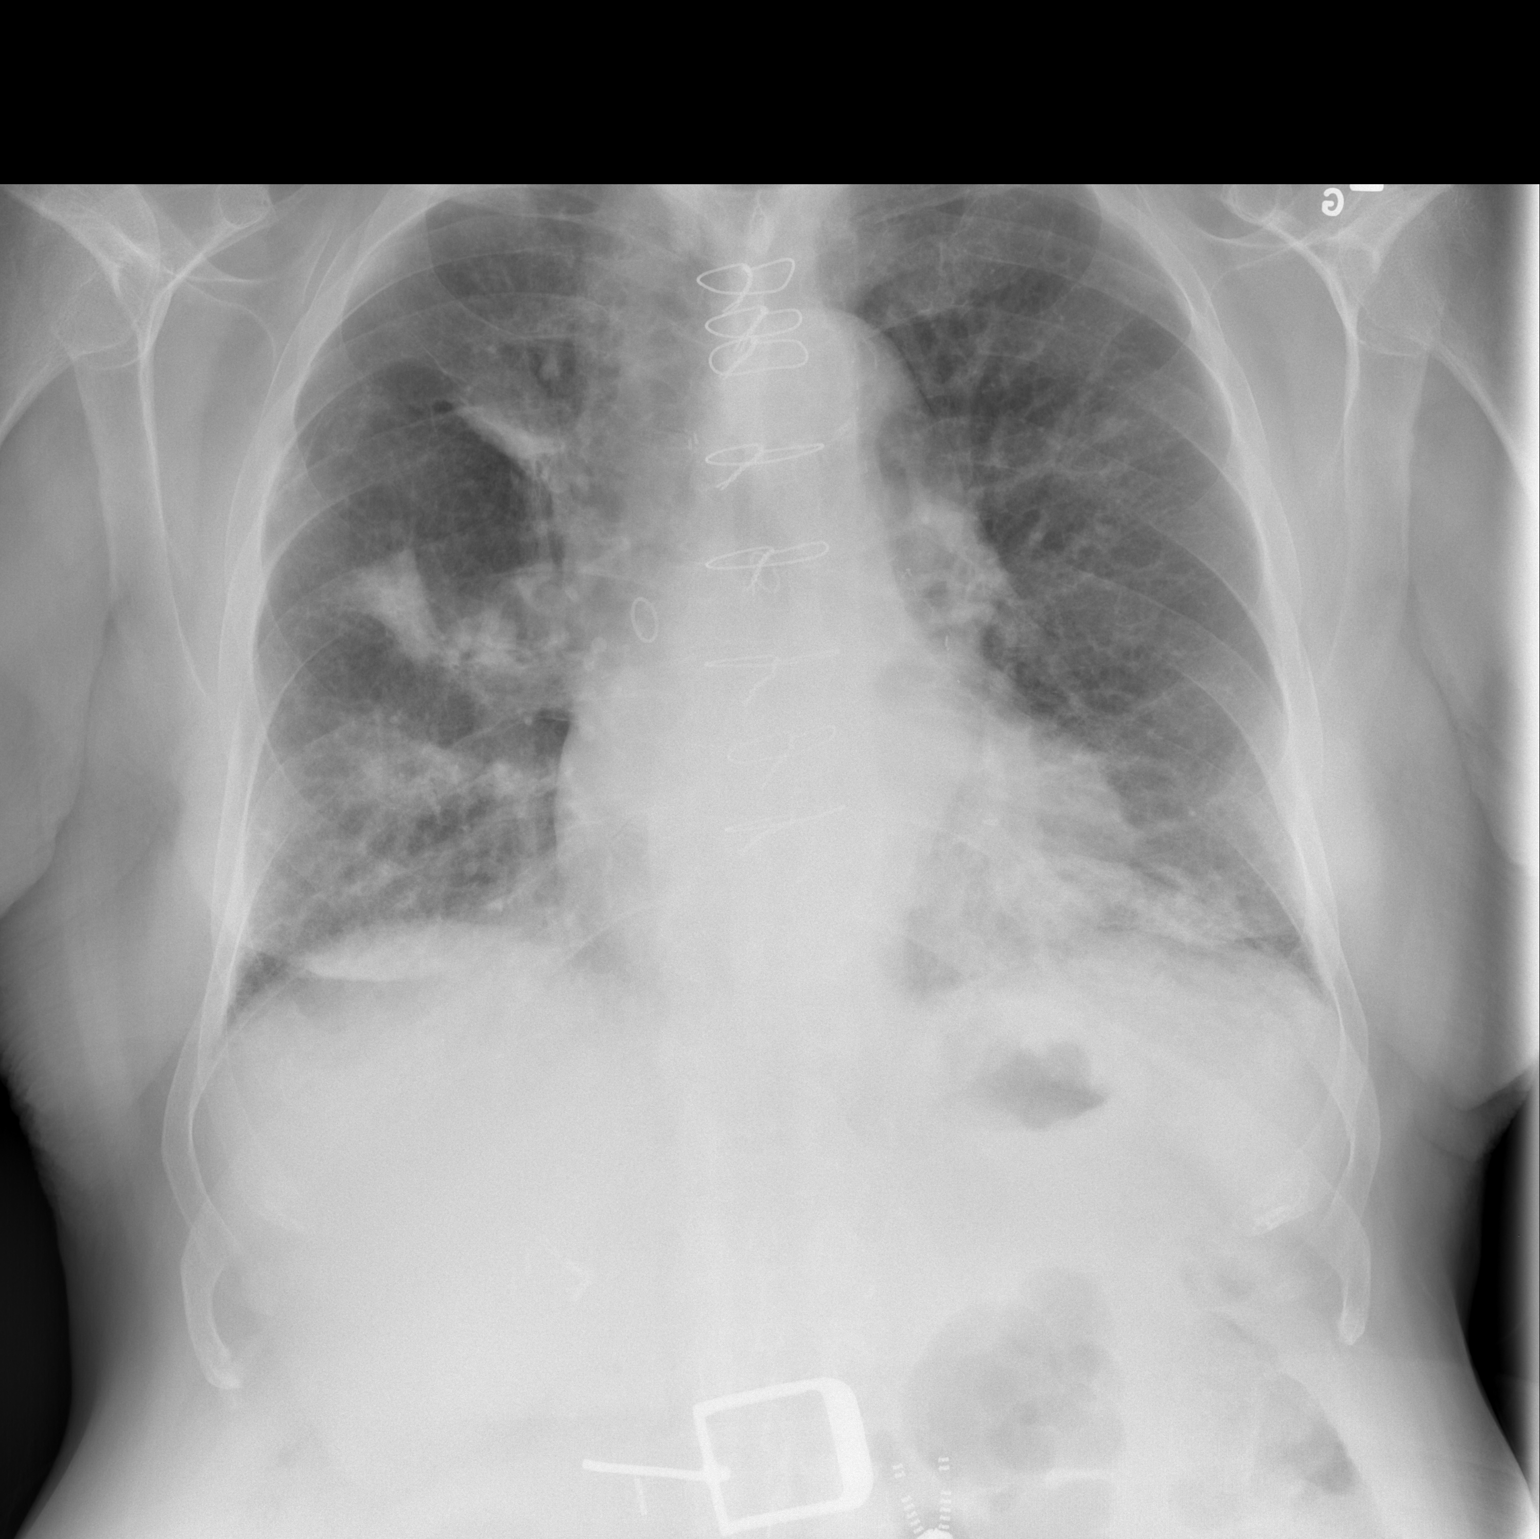

[w chest lat]
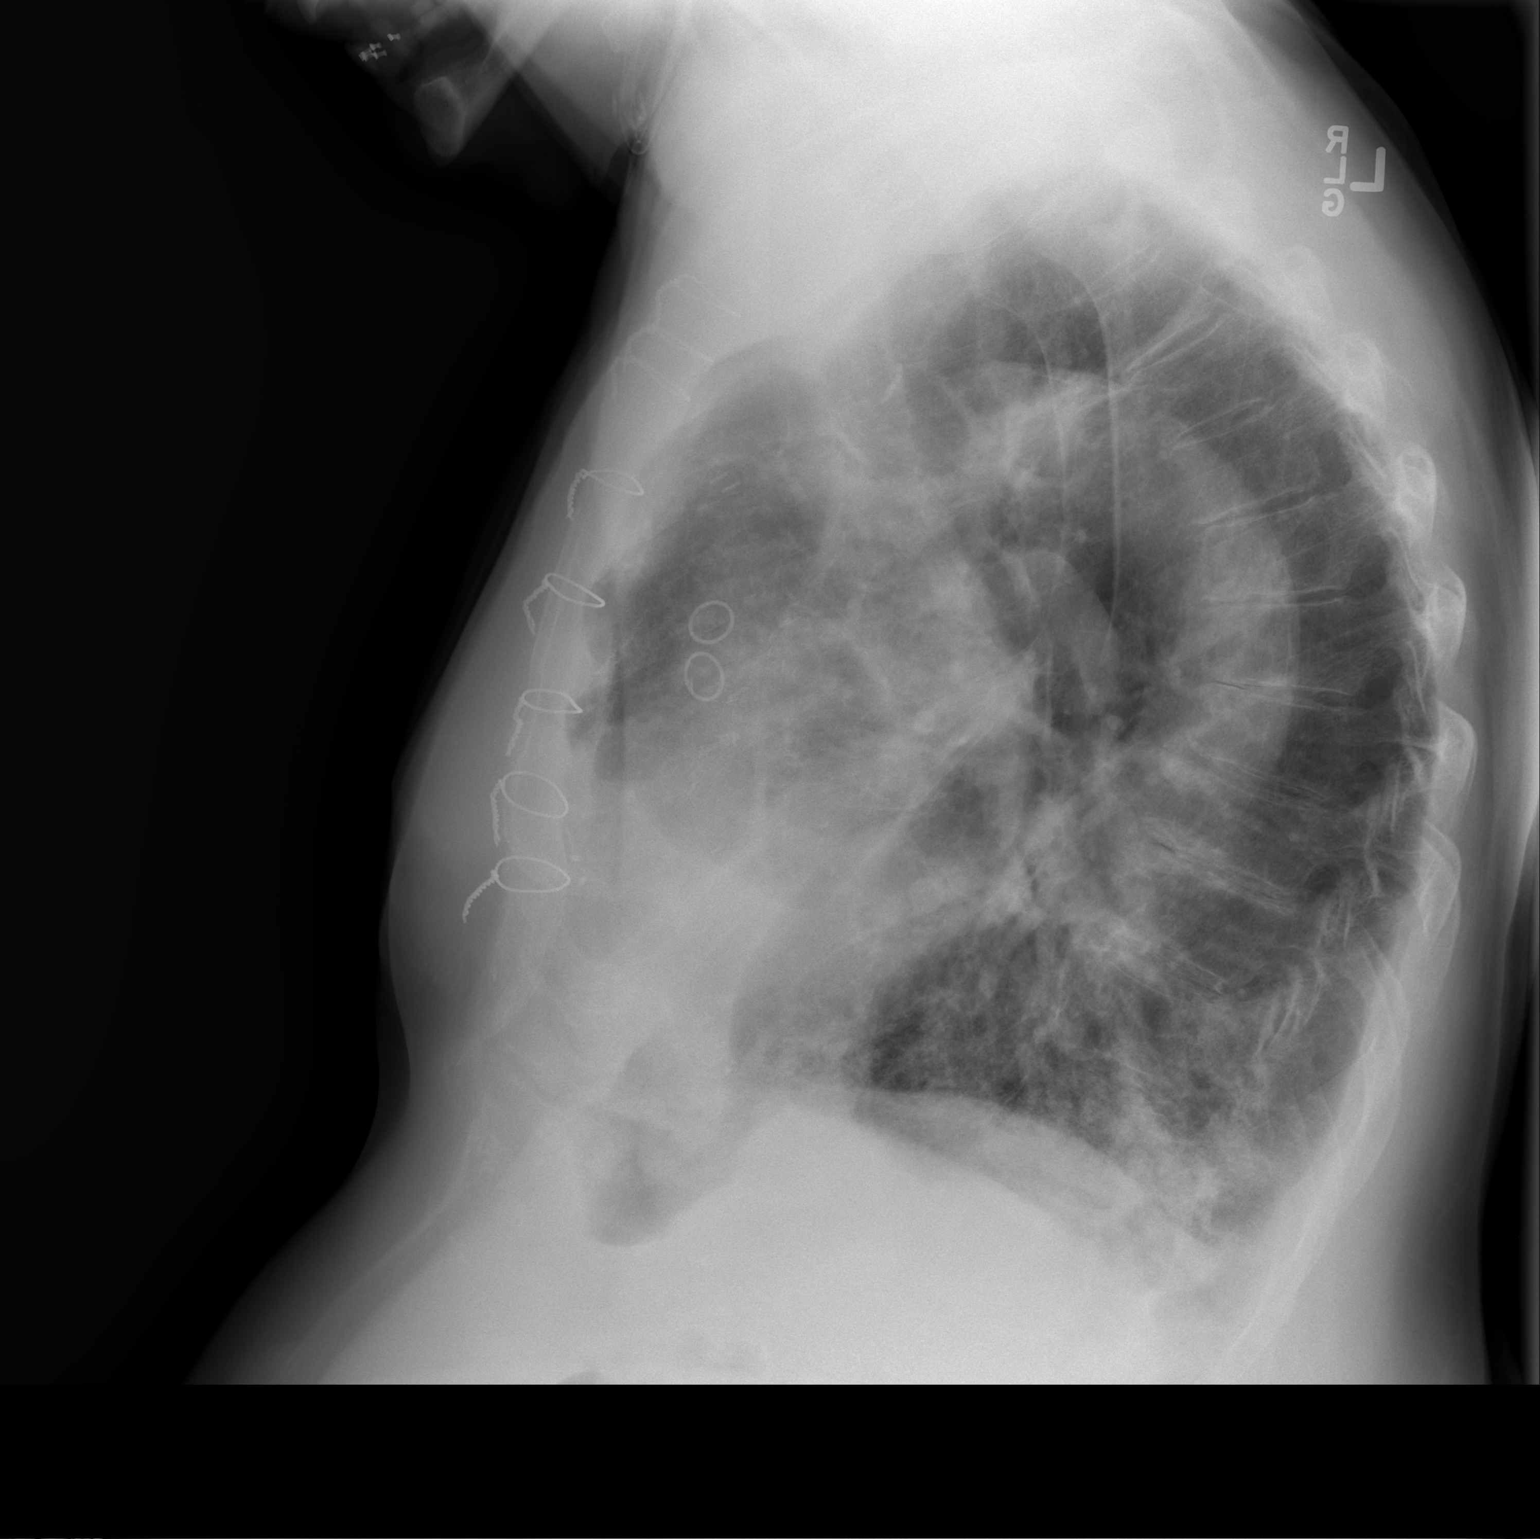

[2 of 2 positions shown; findings below may reference images not displayed]

FINDINGS: There are patchy airspace opacities at each lung base which are not
convincingly seen on the recent chest CT, or are at least mildly
increased suggesting developing pneumonia or edema. The confluent
consolidations within the right mid to upper lung regions correspond
to consolidations versus confluent scarring/fibrosis identified on
the recent chest CT.

There is no pleural effusion seen. No pneumothorax.
Cardiomediastinal silhouette is stable in size and configuration.
Median sternotomy wires appear intact and stable in alignment. No
acute osseous abnormality seen.
IMPRESSION: Confluent airspace opacities within the right mid to upper lung
regions correspond to consolidations versus chronic confluent
scarring/fibrosis identified on the recent outside chest CT (favor
chronic confluent scarring/fibrosis). Alternatively, this may be
related to the given history of lung cancer (treated disease?). The
report from the recent outside chest CTs are unavailable.

Increased patchy airspace opacities at each lung base, particularly
on the left, suspicious for developing bibasilar pneumonias or
asymmetric edema, less likely atelectasis.

## 2016-11-26 IMAGING — CR DG CHEST 2V
2 series · 2 of 2 positions shown · non-contrast
Comparison: Chest 04/08/2015.  CT chest 03/01/2015

CLINICAL DATA: COPD exacerbation since yesterday. Shortness of
breath, weakness, left-sided chest pain.

EXAM:
CHEST  2 VIEW

[chest pa]
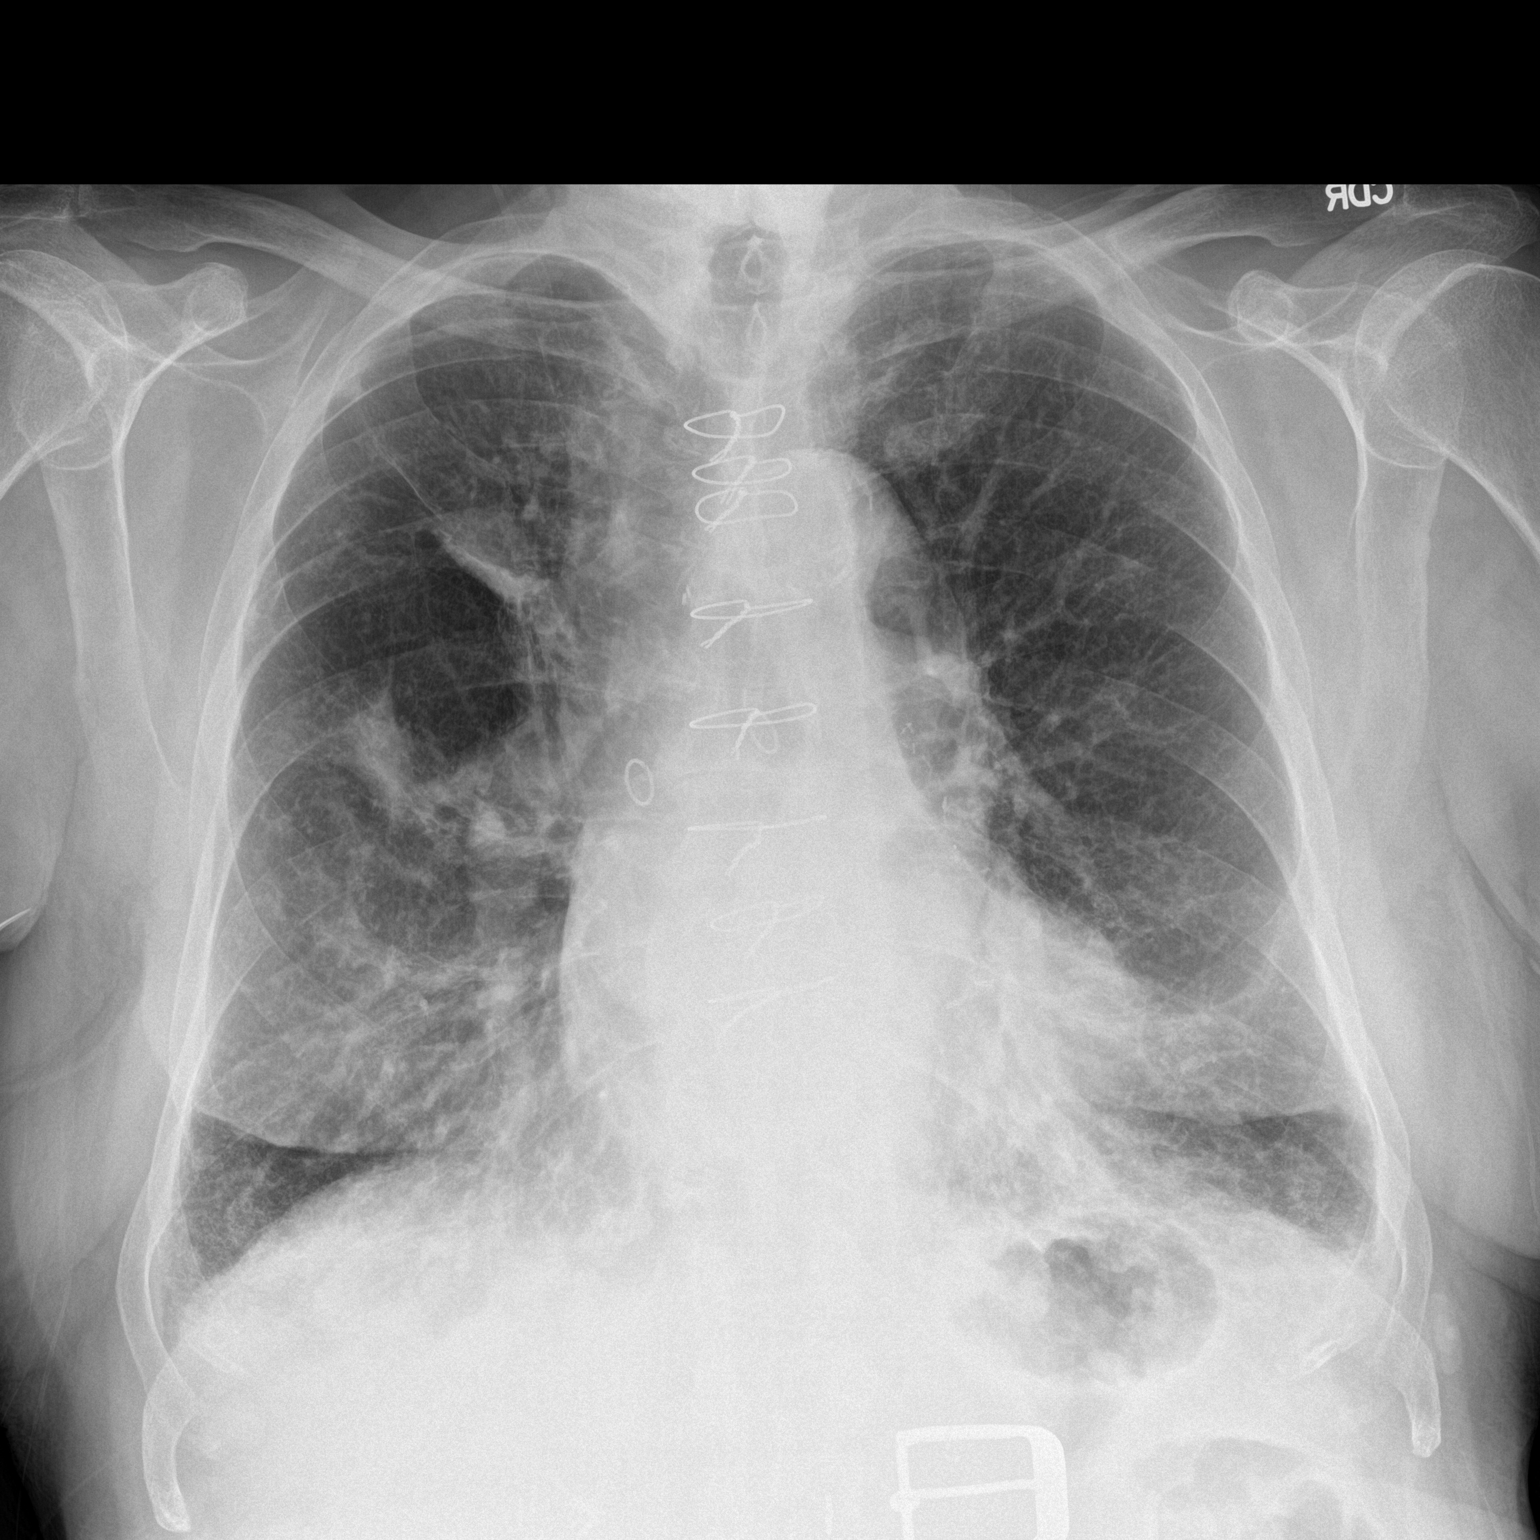

[chest lat]
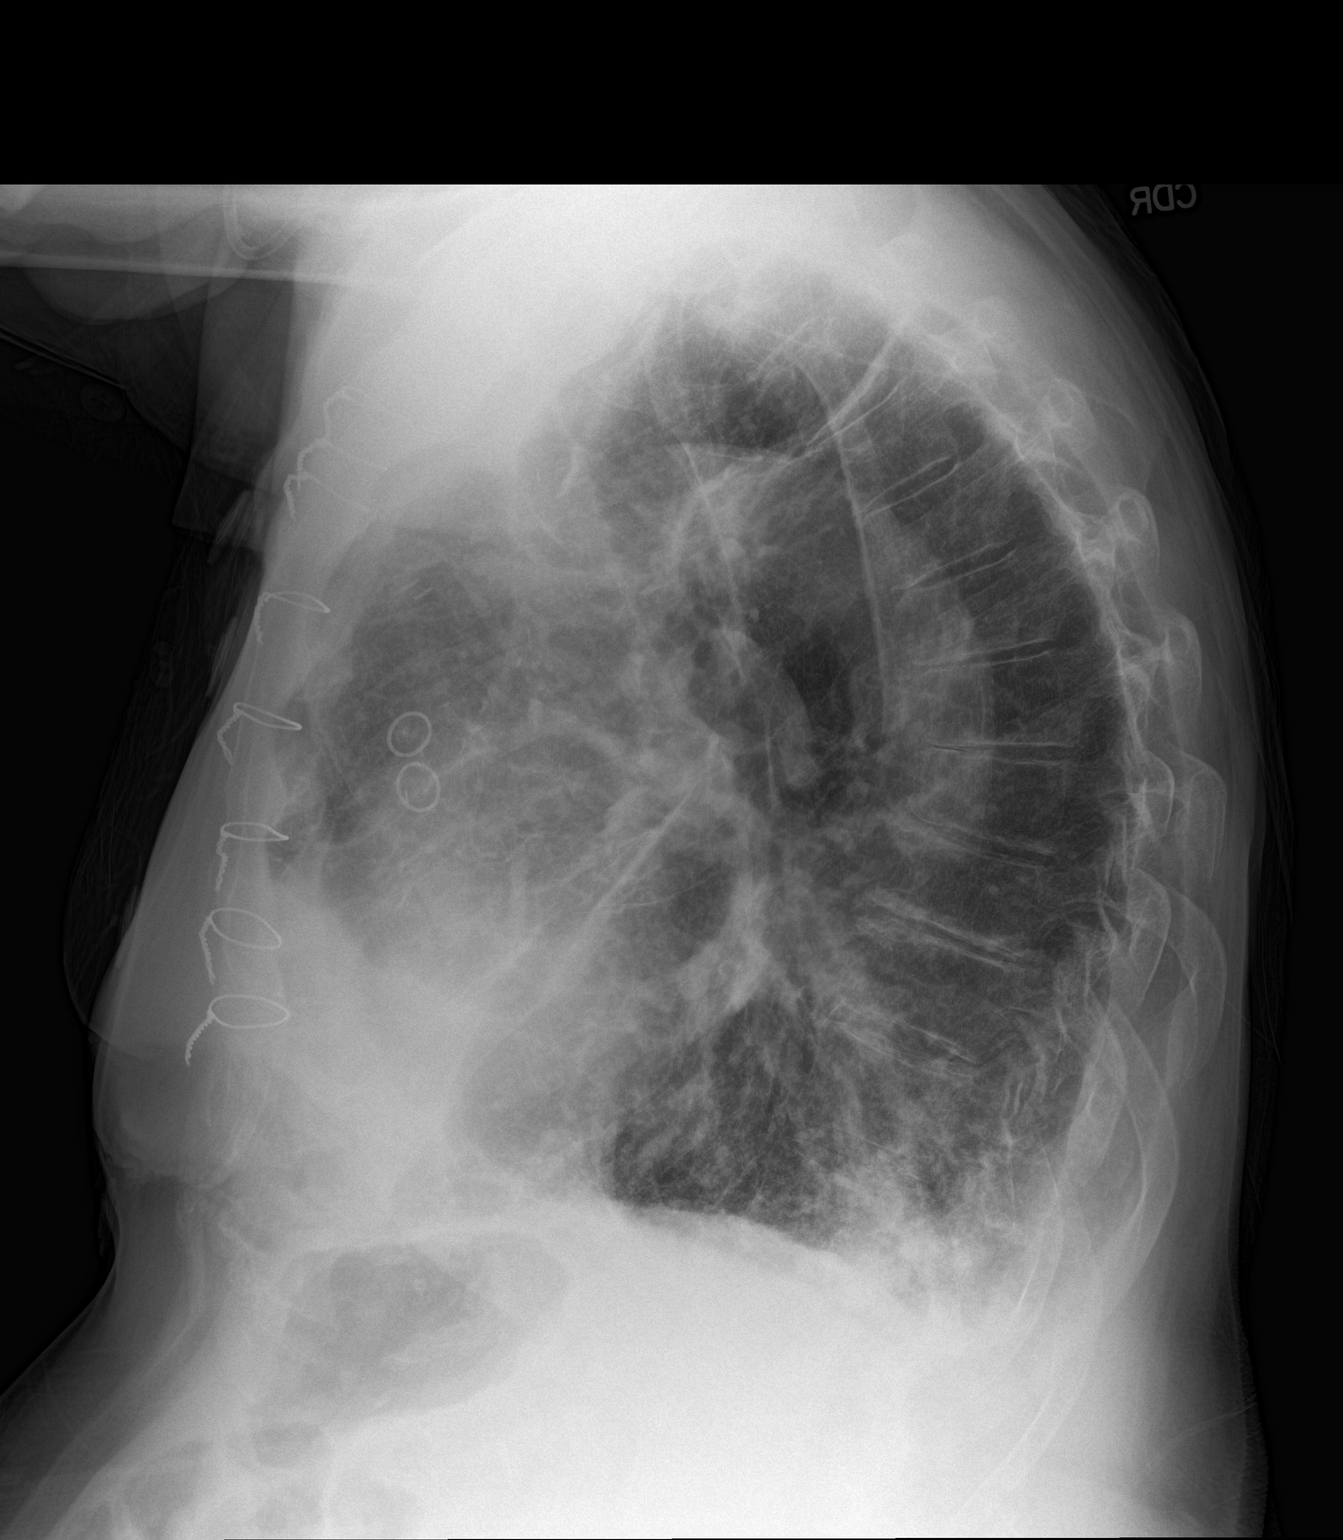

[2 of 2 positions shown; findings below may reference images not displayed]

FINDINGS: Postoperative changes in the mediastinum. Normal heart size and
pulmonary vascularity. Calcified and tortuous aorta. Focal opacities
demonstrated in the right upper and right mid lung as well as in the
left lung base. These were present on the previous study without
significant interval change. Similar findings have been present on
radiographs dating back to 07/21/2011. Correlation with previous
outside chest CT suggested these likely represent areas of confluent
fibrosis or scarring. Blunting of costophrenic angles suggesting
thickened pleura. Focal infiltration in the left lung base could
indicate a superimposed area of pneumonia. This does not present on
the previous CT. Scattered calcified granulomas. No pneumothorax.
Hyperinflation suggesting emphysema. Mild diffuse interstitial
pattern suggesting fibrosis.
IMPRESSION: Focal confluent areas of infiltration or scarring in the right mid
lung and right upper lung as well as left lower lung. Emphysematous
changes and interstitial fibrosis. Possible superimposed developing
infiltration in the left lung base posteriorly. Appearance similar
to yesterday's study.

## 2016-12-02 IMAGING — MR MR HEAD W/O CM
9 of 10 series · 37 of 48 positions shown · non-contrast
Comparison: None.

CLINICAL DATA: Altered mental status. Toxic metabolic
encephalopathy. Multiple risk factors for CVA.

EXAM:
MRI HEAD WITHOUT CONTRAST
TECHNIQUE: Multiplanar, multiecho pulse sequences of the brain and surrounding
structures were obtained without intravenous contrast.

[Series 3: DWI · axial · 3.0mm · 1.09mm/px · z∈[-33,+98]mm · 11 of 90 slices shown (1 of 4)]
[im 1/90]
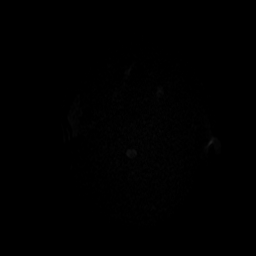
[im 9/90]
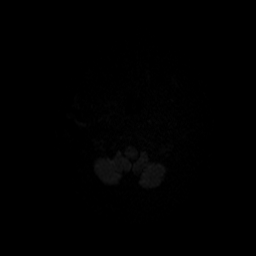
[im 18/90]
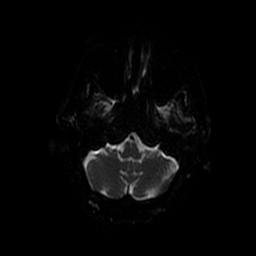
[im 27/90]
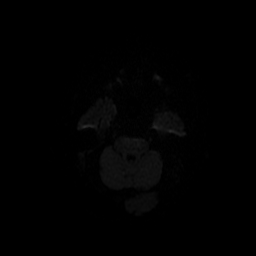
[im 36/90]
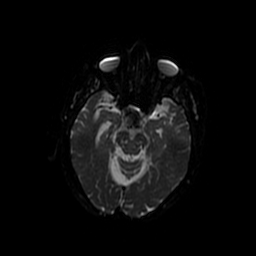
[im 45/90]
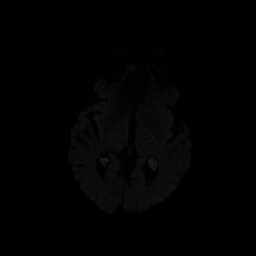
[im 54/90]
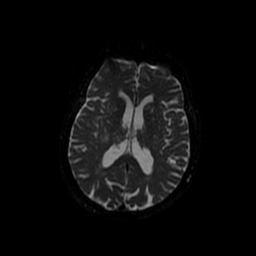
[im 63/90]
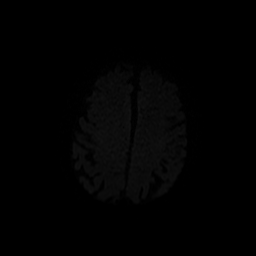
[im 72/90]
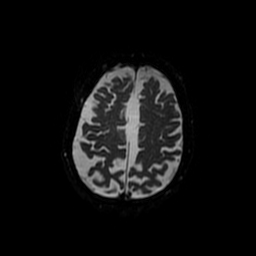
[im 81/90]
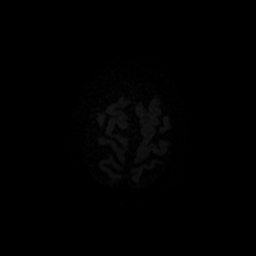
[im 90/90]
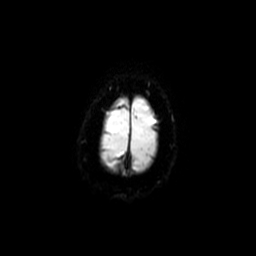

[Series 4: T1 · sagittal · 5.0mm · 0.47mm/px · 3 of 23 slices shown]
[im 1/23]
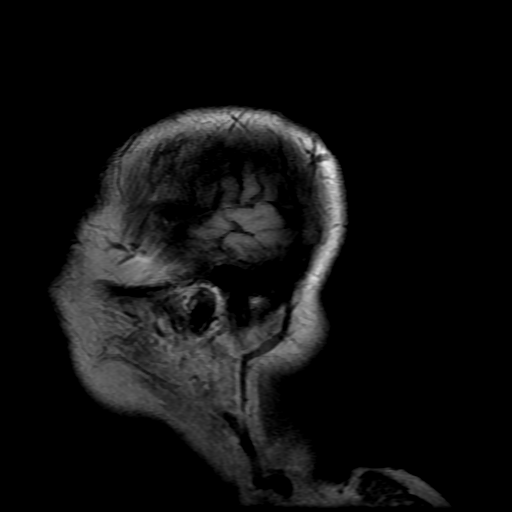
[im 12/23]
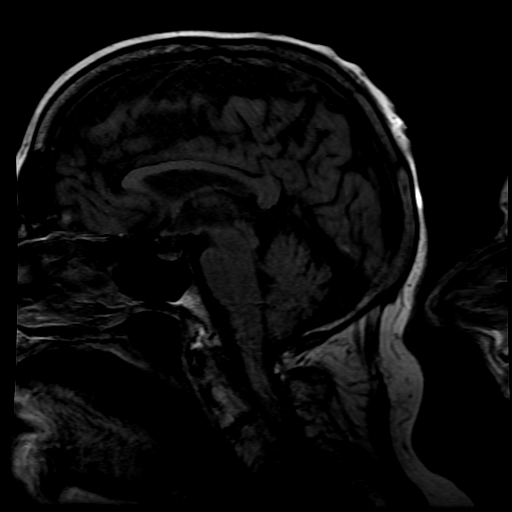
[im 23/23]
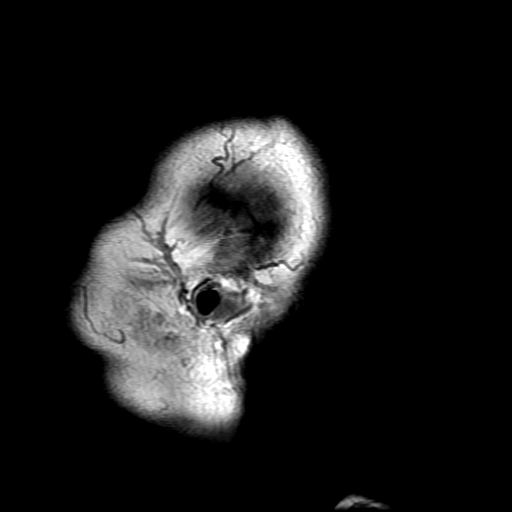

[Series 5: T2 · axial · 5.0mm · 0.43mm/px · z∈[-38,+93]mm · 2 of 23 slices shown (1 of 2)]
[im 1/23]
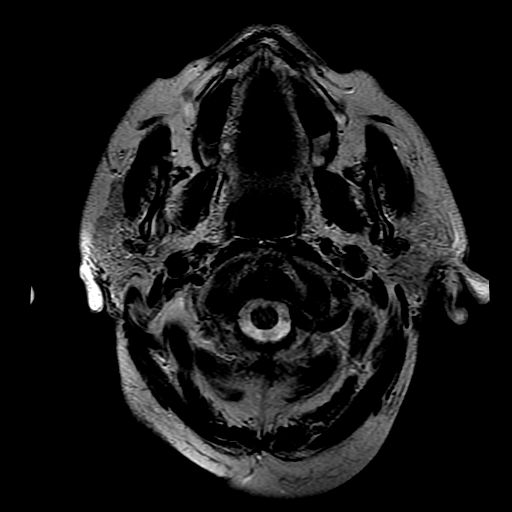
[im 23/23]
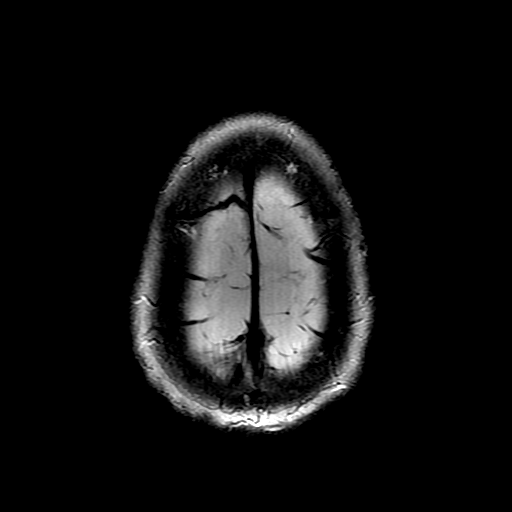

[Series 6: DWI · coronal · 5.0mm · 1.09mm/px · 7 of 64 slices shown (2 of 4)]
[im 1/64]
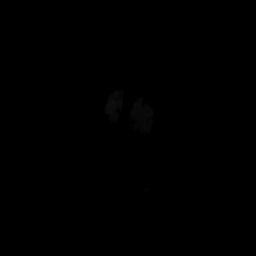
[im 11/64]
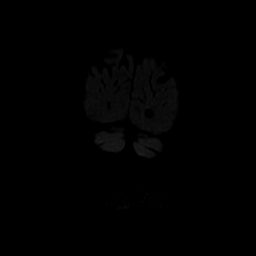
[im 22/64]
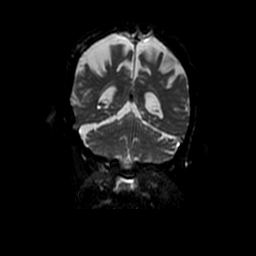
[im 32/64]
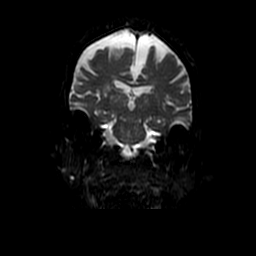
[im 43/64]
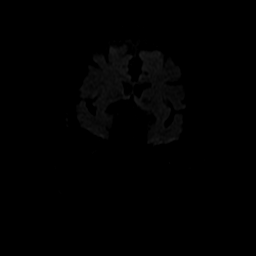
[im 53/64]
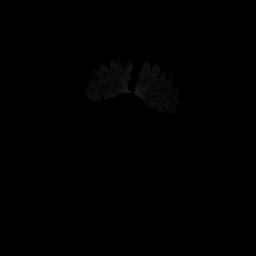
[im 64/64]
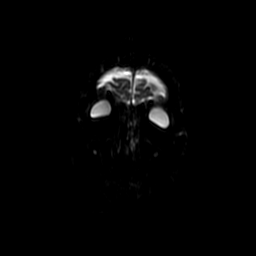

[Series 7: FLAIR · axial · 5.0mm · 0.43mm/px · z∈[-38,+93]mm · 2 of 23 slices shown]
[im 1/23]
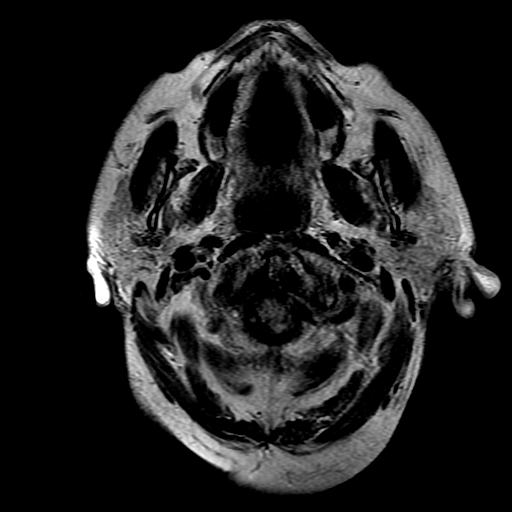
[im 23/23]
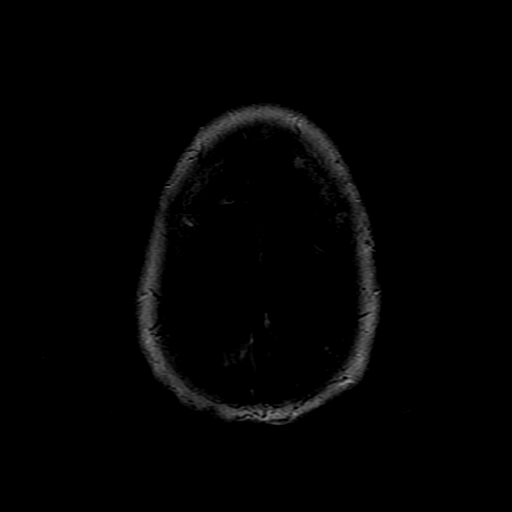

[Series 8: ax mpgr · axial · 5.0mm · 0.43mm/px · 1 of 23 slices shown]
[im 1/23]
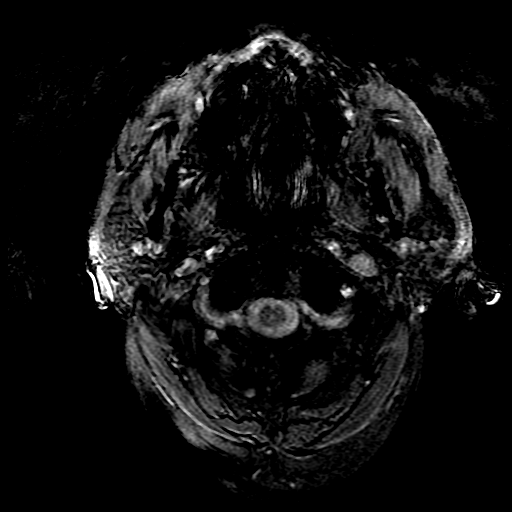

[Series 10: T2 · coronal · 5.0mm · 0.39mm/px · 3 of 27 slices shown (2 of 2)]
[im 1/27]
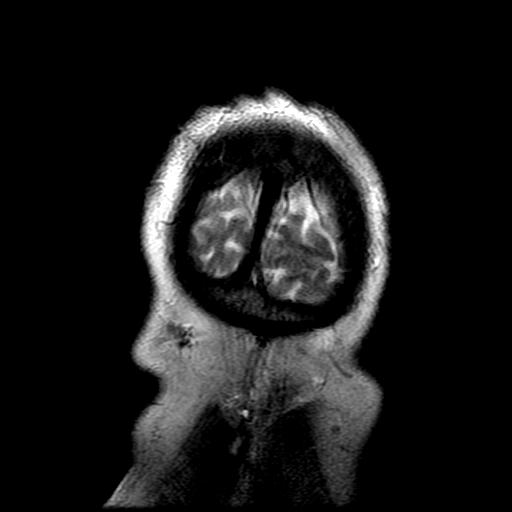
[im 14/27]
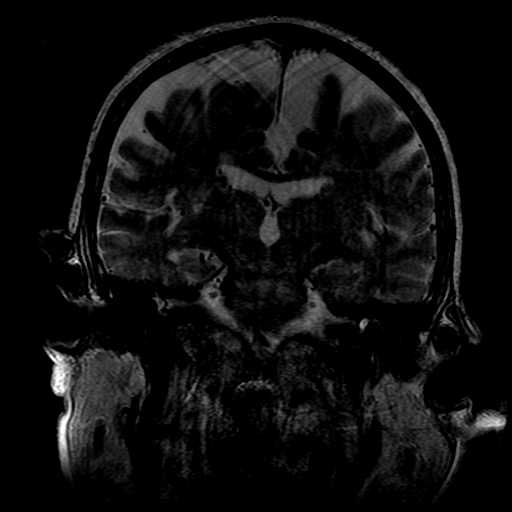
[im 27/27]
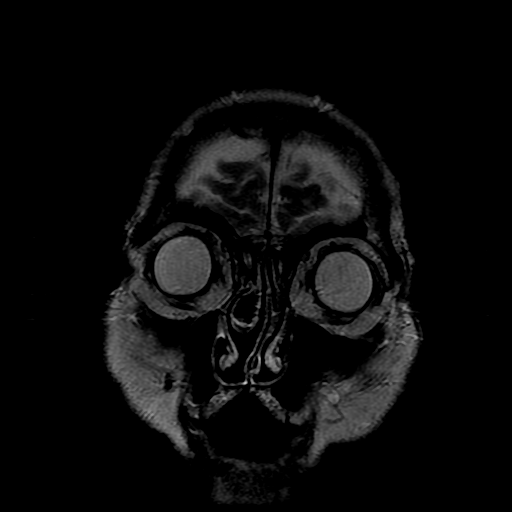

[Series 300: DWI · axial · 3.0mm · 1.09mm/px · z∈[-33,+98]mm · 5 of 45 slices shown (3 of 4)]
[im 1/45]
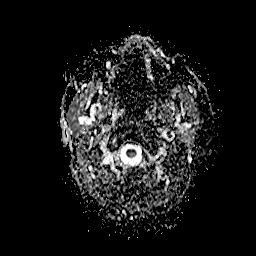
[im 12/45]
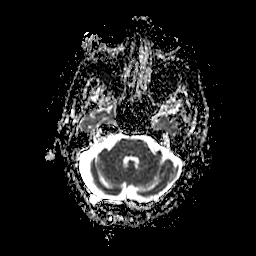
[im 23/45]
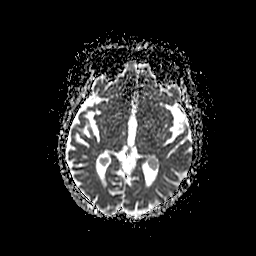
[im 34/45]
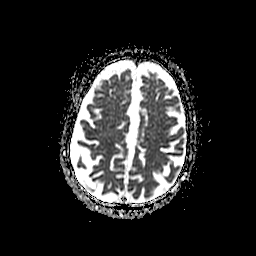
[im 45/45]
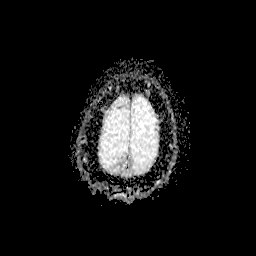

[Series 600: DWI · coronal · 5.0mm · 1.09mm/px · 3 of 32 slices shown (4 of 4)]
[im 1/32]
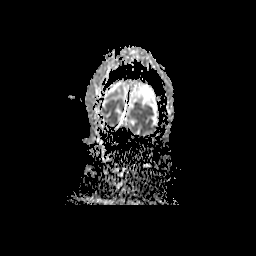
[im 16/32]
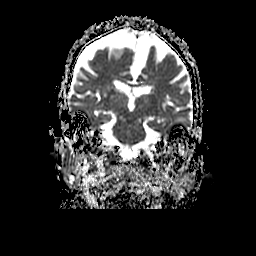
[im 32/32]
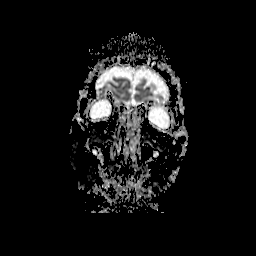

[37 of 48 positions shown; findings below may reference images not displayed]

FINDINGS: The diffusion-weighted images demonstrate no evidence for acute or
subacute infarction. Remote lacunar infarcts are present posteriorly
in the right lentiform nucleus and throughout the left basal
ganglia. Remote ischemic changes are present in the thalami
bilaterally. Mild periventricular white matter changes are noted
bilaterally.

There is no flow in the right vertebral artery. Flow is present
throughout the remainder of the major intracranial arteries.
Bilateral lens replacements are noted. The globes and orbits are
otherwise intact. The a prominent concha bullosa is noted in the
right middle turbinate. The paranasal sinuses and mastoid air cells
are clear. The skullbase is within normal limits. Midline structures
demonstrate mild cerebellar tonsillar ectopia without definite
Chiari malformation. Degenerative anterolisthesis is present at
C2-3. Degenerative changes are noted at C1-2 is well. Acquired
fusion is noted across the disc space at C3-4.
IMPRESSION: 1. No acute intracranial abnormality.
2. Moderate generalized atrophy.
3. Moderate chronic ischemic changes within the basal ganglia and
brainstem.
4. Moderate degenerative changes in the upper cervical spine.
